# Patient Record
Sex: Male | Born: 1985 | Race: White | Hispanic: No | Marital: Single | Smoking: Never smoker
Health system: Southern US, Community
[De-identification: ages and names within clinical notes are randomized; demographics above are authoritative.]

## PROBLEM LIST (undated history)

## (undated) DIAGNOSIS — M4800 Spinal stenosis, site unspecified: Secondary | ICD-10-CM

## (undated) DIAGNOSIS — M1990 Unspecified osteoarthritis, unspecified site: Secondary | ICD-10-CM

## (undated) DIAGNOSIS — K760 Fatty (change of) liver, not elsewhere classified: Secondary | ICD-10-CM

## (undated) DIAGNOSIS — F32A Depression, unspecified: Secondary | ICD-10-CM

## (undated) DIAGNOSIS — U071 COVID-19: Secondary | ICD-10-CM

## (undated) DIAGNOSIS — I10 Essential (primary) hypertension: Secondary | ICD-10-CM

## (undated) DIAGNOSIS — R519 Headache, unspecified: Secondary | ICD-10-CM

## (undated) DIAGNOSIS — F419 Anxiety disorder, unspecified: Secondary | ICD-10-CM

## (undated) DIAGNOSIS — Z789 Other specified health status: Secondary | ICD-10-CM

## (undated) DIAGNOSIS — E119 Type 2 diabetes mellitus without complications: Secondary | ICD-10-CM

## (undated) DIAGNOSIS — Z8489 Family history of other specified conditions: Secondary | ICD-10-CM

## (undated) DIAGNOSIS — F909 Attention-deficit hyperactivity disorder, unspecified type: Secondary | ICD-10-CM

## (undated) HISTORY — DX: Other specified health status: Z78.9

## (undated) HISTORY — PX: NO PAST SURGERIES: SHX2092

## (undated) HISTORY — DX: Spinal stenosis, site unspecified: M48.00

---

## 2013-11-28 ENCOUNTER — Ambulatory Visit (INDEPENDENT_AMBULATORY_CARE_PROVIDER_SITE_OTHER): Payer: Self-pay | Admitting: Sports Medicine

## 2013-11-28 ENCOUNTER — Encounter: Payer: Self-pay | Admitting: Sports Medicine

## 2013-11-28 VITALS — BP 121/80 | HR 65 | Ht 72.0 in | Wt 263.0 lb

## 2013-11-28 DIAGNOSIS — S62613A Displaced fracture of proximal phalanx of left middle finger, initial encounter for closed fracture: Secondary | ICD-10-CM

## 2013-11-28 DIAGNOSIS — IMO0001 Reserved for inherently not codable concepts without codable children: Secondary | ICD-10-CM | POA: Insufficient documentation

## 2013-11-28 MED ORDER — TRAMADOL HCL 50 MG PO TABS: ORAL_TABLET | ORAL | Status: DC

## 2013-11-28 NOTE — Progress Notes (Signed)
  Subjective:    CC: Injury to finger  HPI:  3 days ago this very pleasant 28 year old male was bowling, and he threw the ball he felt a pop in his left third finger with immediate pain, swelling, and bruising. He still had good function however pain worsened over the next couple of days and he presents for further evaluation and definitive treatment. Pain is worse at the volar neck of the left third proximal phalanx. Moderate, persistent.  Past medical history, Surgical history, Family history not pertinant except as noted below, Social history, Allergies, and medications have been entered into the medical record, reviewed, and no changes needed.   Review of Systems: No headache, visual changes, nausea, vomiting, diarrhea, constipation, dizziness, abdominal pain, skin rash, fevers, chills, night sweats, swollen lymph nodes, weight loss, chest pain, body aches, joint swelling, muscle aches, shortness of breath, mood changes, visual or auditory hallucinations.  Objective:    General: Well Developed, well nourished, and in no acute distress.  Neuro: Alert and oriented x3, extra-ocular muscles intact, sensation grossly intact.  HEENT: Normocephalic, atraumatic, pupils equal round reactive to light, neck supple, no masses, no lymphadenopathy, thyroid nonpalpable.  Skin: Warm and dry, no rashes noted.  Cardiac: Regular rate and rhythm, no murmurs rubs or gallops.  Respiratory: Clear to auscultation bilaterally. Not using accessory muscles, speaking in full sentences.  Abdominal: Soft, nontender, nondistended, positive bowel sounds, no masses, no organomegaly.  Left hand: visible swelling on the entirety of the third digit, there is tenderness to palpation at the PIP joint, but excellent strength and range of motion, with good function of the extensors, lumbricals, flexor digitorum superficialis and flexor digitorum profundus. Collateral ligaments are stable. He is also neurovascularly intact  distally.  Procedure: Diagnostic Ultrasound of  left finger Device: GE Logiq E  Findings: There is a hypoechoic gap at the volar neck of the left third proximal phalanx, there is also reactive hypoechoic change in the flexor digitorum superficialis and profundus tendon sheaths. Images not stored. Impression: Non-angulated, nondisplaced fracture through the volar neck of the left third proximal phalanx.  Extension splint placed.  Impression and Recommendations:    The patient was counselled, risk factors were discussed, anticipatory guidance given.

## 2013-11-28 NOTE — Assessment & Plan Note (Signed)
Per patient request, no imaging of that ultrasound keep costs down. I did see the continuity of the flexor digitorum superficialis, profundus, and lumbricals, and a small fracture through the neck of the left third proximal phalanx. There is also some secondary edema in the flexor tendon sheath. This appears nondisplaced and non-angulated, and function of the finger is excellent. Static extension splint placed. Tramadol for pain. Return in 3 weeks.  I did not bill a fracture code.

## 2013-12-19 ENCOUNTER — Encounter: Payer: Self-pay | Admitting: Sports Medicine

## 2013-12-19 ENCOUNTER — Ambulatory Visit (INDEPENDENT_AMBULATORY_CARE_PROVIDER_SITE_OTHER): Payer: Self-pay | Admitting: Sports Medicine

## 2013-12-19 VITALS — BP 126/71 | HR 78 | Ht 72.0 in | Wt 265.0 lb

## 2013-12-19 DIAGNOSIS — S62613D Displaced fracture of proximal phalanx of left middle finger, subsequent encounter for fracture with routine healing: Secondary | ICD-10-CM

## 2013-12-19 NOTE — Assessment & Plan Note (Signed)
Discontinue splinting, start gentle range of motion exercises. May start gentle bowling with a lightweight ball in 2 weeks, 15 pound ball in 3 weeks. Return to see me in 4 weeks. There was reactive tendinitis in the flexor digitorum tendons on ultrasound, at the six-week point this would be a candidate for injection if continues to have pain referable to the tendon sheath.

## 2013-12-19 NOTE — Progress Notes (Signed)
  Subjective: 3 weeks post nondisplaced fracture of the volar neck of the left third finger. Only slight amount of pain. Going crazy being unable to bowl.   Objective: General: Well-developed, well-nourished, and in no acute distress. Left third finger: Minimal tenderness to palpation, good function, neurovascularly intact distally.  Assessment/plan:

## 2014-01-16 ENCOUNTER — Encounter: Payer: Self-pay | Admitting: Sports Medicine

## 2014-01-16 ENCOUNTER — Ambulatory Visit (INDEPENDENT_AMBULATORY_CARE_PROVIDER_SITE_OTHER): Payer: Self-pay | Admitting: Sports Medicine

## 2014-01-16 VITALS — BP 137/84 | HR 77 | Ht 72.0 in | Wt 258.0 lb

## 2014-01-16 DIAGNOSIS — S62613D Displaced fracture of proximal phalanx of left middle finger, subsequent encounter for fracture with routine healing: Secondary | ICD-10-CM

## 2014-01-16 DIAGNOSIS — M779 Enthesopathy, unspecified: Principal | ICD-10-CM

## 2014-01-16 DIAGNOSIS — M778 Other enthesopathies, not elsewhere classified: Secondary | ICD-10-CM | POA: Insufficient documentation

## 2014-01-16 DIAGNOSIS — M6588 Other synovitis and tenosynovitis, other site: Secondary | ICD-10-CM

## 2014-01-16 NOTE — Progress Notes (Signed)
  Subjective:    CC: Follow-up  HPI: Kenneth Mcbride returns, he is approximate 6 weeks post fracture of the left third proximal phalanx. The fracture site is pain-free however he continues to have pain along the flexor tendon sheath, we did diagnose a flexor tendinitis on ultrasound at the last visit. He is amenable to try interventional management. Pain is moderate, persistent. He is unable to fully flex the finger.  Past medical history, Surgical history, Family history not pertinant except as noted below, Social history, Allergies, and medications have been entered into the medical record, reviewed, and no changes needed.   Review of Systems: No fevers, chills, night sweats, weight loss, chest pain, or shortness of breath.   Objective:    General: Well Developed, well nourished, and in no acute distress.  Neuro: Alert and oriented x3, extra-ocular muscles intact, sensation grossly intact.  HEENT: Normocephalic, atraumatic, pupils equal round reactive to light, neck supple, no masses, no lymphadenopathy, thyroid nonpalpable.  Skin: Warm and dry, no rashes. Cardiac: Regular rate and rhythm, no murmurs rubs or gallops, no lower extremity edema.  Respiratory: Clear to auscultation bilaterally. Not using accessory muscles, speaking in full sentences.  Left hand: Tender to palpation along the flexor digitorum tendon sheath just distal to the A1 pulley, no palpable nodules or triggering. Fracture site is no longer tender to palpation. Lacks approximately 5 of flexion at the third MCP and PIP joints.  Procedure: Real-time Ultrasound Guided Injection of left third flexor digitorum tendon sheath Device: GE Logiq E  Verbal informed consent obtained.  Time-out conducted.  Noted no overlying erythema, induration, or other signs of local infection.  Skin prepped in a sterile fashion.  Local anesthesia: Topical Ethyl chloride.  With sterile technique and under real time ultrasound guidance:  1 mL kenalog  40, 2 mL lidocaine injected easily. Completed without difficulty  Pain immediately resolved suggesting accurate placement of the medication.  Advised to call if fevers/chills, erythema, induration, drainage, or persistent bleeding.  Images permanently stored and available for review in the ultrasound unit.  Impression: Technically successful ultrasound guided injection.  Impression and Recommendations:

## 2014-01-16 NOTE — Assessment & Plan Note (Signed)
Clinically resolved, return as needed. 

## 2014-01-16 NOTE — Assessment & Plan Note (Signed)
Fracture is healed. There is significant tendinitis of the left third flexor digitorum tendon sheath. This is preventing him from obtaining full flexion. Tendon sheath injection as above, no bowling for a day. Return to see me in one month.

## 2014-02-13 ENCOUNTER — Ambulatory Visit: Payer: Self-pay | Admitting: Sports Medicine

## 2019-05-20 ENCOUNTER — Other Ambulatory Visit: Payer: Self-pay

## 2019-05-20 ENCOUNTER — Ambulatory Visit (INDEPENDENT_AMBULATORY_CARE_PROVIDER_SITE_OTHER): Payer: Self-pay | Admitting: Sports Medicine

## 2019-05-20 ENCOUNTER — Encounter: Payer: Self-pay | Admitting: Sports Medicine

## 2019-05-20 DIAGNOSIS — M48061 Spinal stenosis, lumbar region without neurogenic claudication: Secondary | ICD-10-CM | POA: Insufficient documentation

## 2019-05-20 DIAGNOSIS — M5416 Radiculopathy, lumbar region: Secondary | ICD-10-CM

## 2019-05-20 MED ORDER — KETOROLAC TROMETHAMINE 60 MG/2ML IM SOLN
60.0000 mg | Freq: Once | INTRAMUSCULAR | Status: AC
  Administered 2019-05-20: 60 mg via INTRAMUSCULAR

## 2019-05-20 MED ORDER — PREDNISONE 50 MG PO TABS
ORAL_TABLET | ORAL | 0 refills | Status: DC
Start: 2019-05-20 — End: 2019-05-26

## 2019-05-20 MED ORDER — METHYLPREDNISOLONE SODIUM SUCC 125 MG IJ SOLR
125.0000 mg | Freq: Once | INTRAMUSCULAR | Status: AC
  Administered 2019-05-20: 125 mg via INTRAMUSCULAR

## 2019-05-20 MED ORDER — CYCLOBENZAPRINE HCL 10 MG PO TABS: ORAL_TABLET | ORAL | 0 refills | Status: DC

## 2019-05-20 NOTE — Progress Notes (Signed)
    Procedures performed today:    None.  Independent interpretation of notes and tests performed by another provider:   None.  Impression and Recommendations:    Left lumbar radiculitis Left S1 distribution to the back of the calf. No red flags. Present now for several months, he is in severe pain, Toradol 60, Solu-Medrol 125 intramuscular. Adding lumbar spine MRI, 5 days of prednisone, Flexeril at night, home rehab exercises printed, return to see me in 4 to 6 weeks.   We will proceed with an epidural if no better.    ___________________________________________ Kenneth Mcbride. Benjamin Stain, M.D., ABFM., CAQSM. Primary Care and Sports Medicine [facility]  Adjunct Instructor of Family Medicine  [employer]

## 2019-05-20 NOTE — Addendum Note (Signed)
Addended by: Juel Burrow on: 05/20/2019 04:30 PM   Modules accepted: Orders

## 2019-05-20 NOTE — Assessment & Plan Note (Signed)
Left S1 distribution to the back of the calf. No red flags. Present now for several months, he is in severe pain, Toradol 60, Solu-Medrol 125 intramuscular. Adding lumbar spine MRI, 5 days of prednisone, Flexeril at night, home rehab exercises printed, return to see me in 4 to 6 weeks.   We will proceed with an epidural if no better.

## 2019-05-22 NOTE — Telephone Encounter (Signed)
Forwarding to provider.

## 2019-05-26 MED ORDER — PREDNISONE 10 MG (48) PO TBPK: ORAL_TABLET | Freq: Every day | ORAL | 0 refills | Status: DC

## 2019-05-28 ENCOUNTER — Other Ambulatory Visit: Payer: Self-pay

## 2019-05-28 ENCOUNTER — Ambulatory Visit (INDEPENDENT_AMBULATORY_CARE_PROVIDER_SITE_OTHER): Payer: Self-pay | Admitting: Sports Medicine

## 2019-05-28 DIAGNOSIS — M5416 Radiculopathy, lumbar region: Secondary | ICD-10-CM

## 2019-05-28 MED ORDER — OXYCODONE-ACETAMINOPHEN 10-325 MG PO TABS: 1.0000 | ORAL_TABLET | Freq: Three times a day (TID) | ORAL | 0 refills | Status: DC | PRN

## 2019-05-28 MED ORDER — GABAPENTIN 300 MG PO CAPS: ORAL_CAPSULE | ORAL | 3 refills | Status: DC

## 2019-05-28 NOTE — Progress Notes (Signed)
    Procedures performed today:    None.  Independent interpretation of notes and tests performed by another provider:   None.  Brief History, Exam, Impression, and Recommendations:    Left lumbar radiculitis Canon returns, he is a pleasant 34 year old male with left S1 distribution radiculitis. He still has no red flags, he was scheduled for MRI this weekend but the truck broke down. He is now scheduled for this coming weekend but having significant pain in spite of prednisone, Flexeril. He is unable to tolerate the rehab exercises. Hydrocodone has not been effective. Increasing to 10 mg oxycodone/acetaminophen, and adding gabapentin to take at night as he does have significant discomfort from the radicular paresthesias. I'd like to see him back after his MRI and we will go over interventional planning.    ___________________________________________ Ihor Austin. Benjamin Stain, M.D., ABFM., CAQSM. Primary Care and Sports Medicine [facility]  Adjunct Instructor of Family Medicine  [employer]

## 2019-05-28 NOTE — Assessment & Plan Note (Signed)
Kenneth Mcbride returns, he is a pleasant 34 year old male with left S1 distribution radiculitis. He still has no red flags, he was scheduled for MRI this weekend but the truck broke down. He is now scheduled for this coming weekend but having significant pain in spite of prednisone, Flexeril. He is unable to tolerate the rehab exercises. Hydrocodone has not been effective. Increasing to 10 mg oxycodone/acetaminophen, and adding gabapentin to take at night as he does have significant discomfort from the radicular paresthesias. I'd like to see him back after his MRI and we will go over interventional planning.

## 2019-05-31 ENCOUNTER — Ambulatory Visit (INDEPENDENT_AMBULATORY_CARE_PROVIDER_SITE_OTHER): Payer: Self-pay

## 2019-05-31 ENCOUNTER — Other Ambulatory Visit: Payer: Self-pay

## 2019-05-31 DIAGNOSIS — M5416 Radiculopathy, lumbar region: Secondary | ICD-10-CM

## 2019-06-01 DIAGNOSIS — M5416 Radiculopathy, lumbar region: Secondary | ICD-10-CM

## 2019-06-02 NOTE — Telephone Encounter (Signed)
Orders placed for left L3-L4 interlaminar epidural, please call [place] [facility] to schedule.

## 2019-06-03 ENCOUNTER — Other Ambulatory Visit: Payer: Self-pay

## 2019-06-03 ENCOUNTER — Encounter: Payer: Self-pay | Admitting: Sports Medicine

## 2019-06-03 ENCOUNTER — Ambulatory Visit (INDEPENDENT_AMBULATORY_CARE_PROVIDER_SITE_OTHER): Payer: Self-pay | Admitting: Sports Medicine

## 2019-06-03 DIAGNOSIS — M48061 Spinal stenosis, lumbar region without neurogenic claudication: Secondary | ICD-10-CM

## 2019-06-03 MED ORDER — HYDROMORPHONE HCL 4 MG PO TABS: 4.0000 mg | ORAL_TABLET | Freq: Four times a day (QID) | ORAL | 0 refills | Status: DC | PRN

## 2019-06-03 NOTE — Progress Notes (Signed)
    Procedures performed today:    None.  Independent interpretation of notes and tests performed by another provider:   None.  Brief History, Exam, Impression, and Recommendations:    Lumbar spinal stenosis This is a pleasant 34 year old male with a long history of back pain, his MRI did unfortunately show multilevel severe lumbar spinal stenosis worst at the L3-L4 level. He has not responded to steroids, gabapentin at the low dose, he has not responded to 10 mg oxycodone/acetaminophen. He does have an epidural scheduled for Thursday, increasing from Percocet to Dilaudid 4 mg 3 times daily. He will continue to up taper his gabapentin. Looking at his lumbar spine I am concerned that he will need a multilevel decompressive procedure. Return to see me 1 month after the epidural.  Of note it sounds as though he has several medical problems including hypertension, diabetes, he does plan to establish care with one of my partners.    ___________________________________________ Ihor Austin. Benjamin Stain, M.D., ABFM., CAQSM. Primary Care and Sports Medicine [facility]  Adjunct Instructor of Family Medicine  [employer]

## 2019-06-03 NOTE — Assessment & Plan Note (Addendum)
This is a pleasant 34 year old male with a long history of back pain, his MRI did unfortunately show multilevel severe lumbar spinal stenosis worst at the L3-L4 level. He has not responded to steroids, gabapentin at the low dose, he has not responded to 10 mg oxycodone/acetaminophen. He does have an epidural scheduled for Thursday, increasing from Percocet to Dilaudid 4 mg 3 times daily. He will continue to up taper his gabapentin. Looking at his lumbar spine I am concerned that he will need a multilevel decompressive procedure. Return to see me 1 month after the epidural.  Of note it sounds as though he has several medical problems including hypertension, diabetes, he does plan to establish care with one of my partners.

## 2019-06-05 ENCOUNTER — Other Ambulatory Visit: Payer: Self-pay

## 2019-06-05 ENCOUNTER — Ambulatory Visit
Admission: RE | Admit: 2019-06-05 | Discharge: 2019-06-05 | Disposition: A | Discharge status: Home / Self Care | Payer: Self-pay | Source: Ambulatory Visit | Attending: Sports Medicine | Admitting: Sports Medicine

## 2019-06-05 DIAGNOSIS — M5416 Radiculopathy, lumbar region: Secondary | ICD-10-CM

## 2019-06-05 MED ORDER — IOPAMIDOL (ISOVUE-M 200) INJECTION 41%
1.0000 mL | Freq: Once | INTRAMUSCULAR | Status: AC
  Administered 2019-06-05: 1 mL via EPIDURAL

## 2019-06-05 MED ORDER — METHYLPREDNISOLONE ACETATE 40 MG/ML INJ SUSP (RADIOLOG
120.0000 mg | Freq: Once | INTRAMUSCULAR | Status: AC
  Administered 2019-06-05: 80 mg via EPIDURAL

## 2019-06-05 NOTE — Discharge Instructions (Signed)

## 2019-06-10 DIAGNOSIS — M48062 Spinal stenosis, lumbar region with neurogenic claudication: Secondary | ICD-10-CM

## 2019-06-10 DIAGNOSIS — M48061 Spinal stenosis, lumbar region without neurogenic claudication: Secondary | ICD-10-CM

## 2019-06-11 ENCOUNTER — Ambulatory Visit: Payer: Self-pay | Admitting: Family Medicine

## 2019-06-16 ENCOUNTER — Ambulatory Visit: Payer: Self-pay | Admitting: Sports Medicine

## 2019-06-17 MED ORDER — HYDROMORPHONE HCL 4 MG PO TABS: 4.0000 mg | ORAL_TABLET | Freq: Four times a day (QID) | ORAL | 0 refills | Status: AC | PRN

## 2019-06-17 NOTE — Addendum Note (Signed)
Addended by: Monica Becton on: 06/17/2019 10:51 AM   Modules accepted: Orders

## 2019-07-01 ENCOUNTER — Ambulatory Visit: Payer: Self-pay | Admitting: Sports Medicine

## 2019-07-01 DIAGNOSIS — M48062 Spinal stenosis, lumbar region with neurogenic claudication: Secondary | ICD-10-CM

## 2019-07-01 MED ORDER — GABAPENTIN 600 MG PO TABS: 600.0000 mg | ORAL_TABLET | Freq: Three times a day (TID) | ORAL | 3 refills | Status: DC

## 2019-07-11 ENCOUNTER — Other Ambulatory Visit: Payer: Self-pay | Admitting: Orthopedic Surgery

## 2019-07-20 NOTE — Progress Notes (Signed)
New Patient Office Visit  Subjective:  Patient ID: Kenneth Mcbride, male    DOB: 01/19/1986  Age: 34 y.o. MRN: [id number]  CC:  Chief Complaint  Patient presents with  . Establish Care  . Back Pain    scheduled for surgery on 08/07/2019 and needs to have labs done prior to surgery    HPI Kenneth Mcbride presents to discuss lab work prior to having back surgery on 08/07/2019. He plans to establish care with Iran Planas, PA as his girlfriend is a patient of hers.  Has been dealing with low back pain and spinal trouble for at least 13 weeks. Is planning to have surgery for decompression. Experiencing weakness, paresthesias, coolness from the knees down bilaterally. Has also noted "hot spots" on the left side of the pelvis where the skin feels very hot, occurring at night and interrupting sleep. No alteration in skin integrity.   Has been occasionally checking blood sugars and notes that they have been running in the 100s until the last week or so where they have been in the 200s. Strong family history of diabetes. Notes an increase in urination when sugars are running high.   Past Medical History:  Diagnosis Date  . No pertinent past medical history   . Spinal stenosis     Past Surgical History:  Procedure Laterality Date  . NO PAST SURGERIES      Family History  Problem Relation Age of Onset  . Diabetes Father   . Heart disease Father   . Diabetes Paternal Grandfather   . COPD Paternal Grandfather     Social History   Socioeconomic History  . Marital status: Single    Spouse name: Not on file  . Number of children: Not on file  . Years of education: Not on file  . Highest education level: Not on file  Occupational History  . Not on file  Tobacco Use  . Smoking status: Never Smoker  . Smokeless tobacco: Never Used  Substance and Sexual Activity  . Alcohol use: Not Currently  . Drug use: Never  . Sexual activity: Not Currently    Partners: Female  Other Topics  Concern  . Not on file  Social History Narrative  . Not on file   Social Determinants of Health   Financial Resource Strain:   . Difficulty of Paying Living Expenses:   Food Insecurity:   . Worried About Charity fundraiser in the Last Year:   . Arboriculturist in the Last Year:   Transportation Needs:   . Film/video editor (Medical):   [name] Lack of Transportation (Non-Medical):   Physical Activity:   . Days of Exercise per Week:   . Minutes of Exercise per Session:   Stress:   . Feeling of Stress :   Social Connections:   . Frequency of Communication with Friends and Family:   . Frequency of Social Gatherings with Friends and Family:   . Attends Religious Services:   . Active Member of Clubs or Organizations:   . Attends Archivist Meetings:   [name] Marital Status:   Intimate Partner Violence:   . Fear of Current or Ex-Partner:   . Emotionally Abused:   [name] Physically Abused:   . Sexually Abused:     ROS Review of Systems  Constitutional: Positive for activity change. Negative for chills, fatigue, fever and unexpected weight change.  Respiratory: Negative for cough, shortness of breath and wheezing.   Cardiovascular: Negative  for chest pain, palpitations and leg swelling.  Genitourinary: Positive for frequency. Negative for dysuria.  Musculoskeletal: Positive for back pain, gait problem and myalgias.  Skin:       Temperature difference to bilateral lower legs   Psychiatric/Behavioral: Positive for sleep disturbance.    Objective:   Today's Vitals: BP (!) 137/91   Pulse (!) 103   Temp 98.2 F (36.8 C) (Oral)   Ht 6' (1.829 m)   Wt 284 lb 0.6 oz (128.8 kg)   SpO2 99%   BMI 38.52 kg/m   Physical Exam Vitals reviewed.  Constitutional:      General: He is not in acute distress.    Appearance: Normal appearance. He is obese.  HENT:     Head: Normocephalic and atraumatic.  Cardiovascular:     Rate and Rhythm: Normal rate and regular rhythm.      Pulses: Normal pulses.     Heart sounds: Normal heart sounds. No murmur. No friction rub. No gallop.   Pulmonary:     Effort: Pulmonary effort is normal. No respiratory distress.     Breath sounds: Normal breath sounds. No wheezing, rhonchi or rales.  Skin:    General: Skin is warm and dry.  Neurological:     Mental Status: He is alert and oriented to person, place, and time.     Motor: Weakness (BLE) present.     Gait: Gait abnormal (wheelchair).  Psychiatric:        Mood and Affect: Mood normal.        Behavior: Behavior normal.        Thought Content: Thought content normal.        Judgment: Judgment normal.     Assessment & Plan:   1. Elevated blood sugar/urinary frequency Checking CBC, CMP, and HgbA1c today to establish baseline and check for diabetes. - Hemoglobin A1c - CBC - COMPLETE METABOLIC PANEL WITH GFR  2. Chronic bilateral low back pain, unspecified whether sciatica present Planning for lumbar spine surgery on 08/07/2019. Presurgical workup planned for next week.   Outpatient Encounter Medications as of 07/21/2019  Medication Sig  . acetaminophen (TYLENOL) 500 MG tablet Take 1,000 mg by mouth every 6 (six) hours as needed for moderate pain.  . cyclobenzaprine (FLEXERIL) 10 MG tablet One half to one tab PO qHS, then increase gradually to one tab TID. (Patient taking differently: Take 10 mg by mouth 3 (three) times daily as needed for muscle spasms. )  . docusate sodium (COLACE) 100 MG capsule Take 100 mg by mouth 2 (two) times daily.  [name] gabapentin (NEURONTIN) 600 MG tablet Take 1 tablet (600 mg total) by mouth 3 (three) times daily.  [name] GLUCOSAMINE-CHONDROITIN PO Take 1 tablet by mouth daily.  [name] ibuprofen (ADVIL) 200 MG tablet Take 200 mg by mouth every 6 (six) hours as needed.  . Magnesium 250 MG TABS Take 250 mg by mouth daily.  . Multiple Vitamin (MULTIVITAMIN WITH MINERALS) TABS tablet Take 1 tablet by mouth daily.  . Potassium 99 MG TABS Take 99 mg by mouth in  the morning and at bedtime.   No facility-administered encounter medications on file as of 07/21/2019.    Follow-up: Return for appointment with Lesly Rubenstein to establish care.   Thayer Ohm, DNP, APRN, FNP-BC  [facility]

## 2019-07-21 ENCOUNTER — Other Ambulatory Visit: Payer: Self-pay

## 2019-07-21 ENCOUNTER — Encounter: Payer: Self-pay | Admitting: Medical-Surgical

## 2019-07-21 ENCOUNTER — Ambulatory Visit (INDEPENDENT_AMBULATORY_CARE_PROVIDER_SITE_OTHER): Payer: 59 | Admitting: Medical-Surgical

## 2019-07-21 VITALS — BP 137/91 | HR 103 | Temp 98.2°F | Ht 72.0 in | Wt 284.0 lb

## 2019-07-21 DIAGNOSIS — G8929 Other chronic pain: Secondary | ICD-10-CM | POA: Diagnosis not present

## 2019-07-21 DIAGNOSIS — R739 Hyperglycemia, unspecified: Secondary | ICD-10-CM | POA: Diagnosis not present

## 2019-07-21 DIAGNOSIS — R350 Frequency of micturition: Secondary | ICD-10-CM

## 2019-07-21 DIAGNOSIS — M545 Low back pain: Secondary | ICD-10-CM | POA: Diagnosis not present

## 2019-07-22 LAB — COMPLETE METABOLIC PANEL WITH GFR
AG Ratio: 2.1 (calc) (ref 1.0–2.5)
ALT: 35 U/L (ref 9–46)
AST: 23 U/L (ref 10–40)
Albumin: 4.9 g/dL (ref 3.6–5.1)
Alkaline phosphatase (APISO): 93 U/L (ref 36–130)
BUN: 13 mg/dL (ref 7–25)
CO2: 28 mmol/L (ref 20–32)
Calcium: 9.9 mg/dL (ref 8.6–10.3)
Chloride: 102 mmol/L (ref 98–110)
Creat: 0.96 mg/dL (ref 0.60–1.35)
GFR, Est African American: 118 mL/min/{1.73_m2} (ref >=60)
GFR, Est Non African American: 102 mL/min/{1.73_m2} (ref >=60)
Globulin: 2.3 g/dL (calc) (ref 1.9–3.7)
Glucose, Bld: 105 mg/dL — ABNORMAL HIGH (ref 65–99)
Potassium: 3.6 mmol/L (ref 3.5–5.3)
Sodium: 139 mmol/L (ref 135–146)
Total Bilirubin: 0.6 mg/dL (ref 0.2–1.2)
Total Protein: 7.2 g/dL (ref 6.1–8.1)

## 2019-07-22 LAB — HEMOGLOBIN A1C
Hgb A1c MFr Bld: 7.3 % of total Hgb — ABNORMAL HIGH (ref <5.7)
Mean Plasma Glucose: 163 (calc)
eAG (mmol/L): 9 (calc)

## 2019-07-22 LAB — CBC
HCT: 46.9 % (ref 38.5–50.0)
Hemoglobin: 15.8 g/dL (ref 13.2–17.1)
MCH: 28.1 pg (ref 27.0–33.0)
MCHC: 33.7 g/dL (ref 32.0–36.0)
MCV: 83.3 fL (ref 80.0–100.0)
MPV: 10.4 fL (ref 7.5–12.5)
Platelets: 255 10*3/uL (ref 140–400)
RBC: 5.63 10*6/uL (ref 4.20–5.80)
RDW: 13.2 % (ref 11.0–15.0)
WBC: 10.3 10*3/uL (ref 3.8–10.8)

## 2019-07-22 MED ORDER — METFORMIN HCL 500 MG PO TABS: 500.0000 mg | ORAL_TABLET | Freq: Two times a day (BID) | ORAL | 2 refills | Status: DC

## 2019-07-22 NOTE — Addendum Note (Signed)
Addended byChristen Butter on: 07/22/2019 07:47 AM   Modules accepted: Orders

## 2019-07-29 NOTE — Progress Notes (Signed)
[id number] [place], [place] - [address] [place] [address] Phone: [phone] Fax: [phone]  [id number] - [place], [place] - [address] AT [facility] [address] [place] [address] Phone: [phone] Fax: [phone]      Your procedure is scheduled on August 07, 2019.  Report to [facility] Main Entrance "A" at 9:15 A.M., and check in at the Admitting office.  Call this number if you have problems the morning of surgery:  [phone]  Call [phone] if you have any questions prior to your surgery date Monday-Friday 8am-4pm    Remember:  Do not eat  after midnight the night before your surgery  You may drink clear liquids until 9:15 the morning of your surgery.   Clear liquids allowed are: Water, Non-Citrus Juices (without pulp), Carbonated Beverages, Clear Tea, Black Coffee Only, and Gatorade  Please complete your Gatorade G2  that was provided to you by 9:15 the morning of surgery.  Please, if able, drink it in one setting. DO NOT SIP.    Take these medicines the morning of surgery with A SIP OF WATER: gabapentin (NEURONTIN) cyclobenzaprine (FLEXERIL) - as needed  As of today, STOP taking any Aspirin (unless otherwise instructed by your surgeon) and Aspirin containing products, Aleve, Naproxen, Ibuprofen, Motrin, Advil, Goody's, BC's, all herbal medications, fish oil, and all vitamins.   WHAT DO I DO ABOUT MY DIABETES MEDICATION?   [name] Do not take oral diabetes medicines (pills) the morning of surgery.  HOW TO MANAGE YOUR DIABETES BEFORE AND AFTER SURGERY  Why is it important to control my blood sugar before and after surgery? . Improving blood sugar levels before and after surgery helps healing and can limit problems. . A way of improving blood sugar control is eating a healthy diet by: o  Eating less sugar and carbohydrates o  Increasing activity/exercise o  Talking with your doctor  about reaching your blood sugar goals . High blood sugars (greater than 180 mg/dL) can raise your risk of infections and slow your recovery, so you will need to focus on controlling your diabetes during the weeks before surgery. . Make sure that the doctor who takes care of your diabetes knows about your planned surgery including the date and location.  How do I manage my blood sugar before surgery? . Check your blood sugar at least 4 times a day, starting 2 days before surgery, to make sure that the level is not too high or low. . Check your blood sugar the morning of your surgery when you wake up and every 2 hours until you get to the Short Stay unit. o If your blood sugar is less than 70 mg/dL, you will need to treat for low blood sugar: - Do not take insulin. - Treat a low blood sugar (less than 70 mg/dL) with  cup of clear juice (cranberry or apple), 4 glucose tablets, OR glucose gel. - Recheck blood sugar in 15 minutes after treatment (to make sure it is greater than 70 mg/dL). If your blood sugar is not greater than 70 mg/dL on recheck, call [phone] for further instructions. . Report your blood sugar to the short stay nurse when you get to Short Stay.  . If you are admitted to the hospital after surgery: o Your blood sugar will be checked by the staff and you will probably be given insulin after surgery (instead of oral  diabetes medicines) to make sure you have good blood sugar levels. o The goal for blood sugar control after surgery is 80-180 mg/dL.                           Do not wear jewelry            Do not wear lotions, powders, colognes, or deodorant.            Do not shave 48 hours prior to surgery.  Men may shave face and neck.            Do not bring valuables to the hospital.            [facility] is not responsible for any belongings or valuables.  Do NOT Smoke (Tobacco/Vapping) or drink Alcohol 24 hours prior to your procedure If you use a CPAP at night, you  may bring all equipment for your overnight stay.   Contacts, glasses, dentures or bridgework may not be worn into surgery.      For patients admitted to the hospital, discharge time will be determined by your treatment team.   Patients discharged the day of surgery will not be allowed to drive home, and someone needs to stay with them for 24 hours.    Special instructions:   [place]- Preparing For Surgery  Before surgery, you can play an important role. Because skin is not sterile, your skin needs to be as free of germs as possible. You can reduce the number of germs on your skin by washing with CHG (chlorahexidine gluconate) Soap before surgery.  CHG is an antiseptic cleaner which kills germs and bonds with the skin to continue killing germs even after washing.    Oral Hygiene is also important to reduce your risk of infection.  Remember - BRUSH YOUR TEETH THE MORNING OF SURGERY WITH YOUR REGULAR TOOTHPASTE  Please do not use if you have an allergy to CHG or antibacterial soaps. If your skin becomes reddened/irritated stop using the CHG.  Do not shave (including legs and underarms) for at least 48 hours prior to first CHG shower. It is OK to shave your face.  Please follow these instructions carefully.   1. Shower the NIGHT BEFORE SURGERY and the MORNING OF SURGERY with CHG Soap.   2. If you chose to wash your hair, wash your hair first as usual with your normal shampoo.  3. After you shampoo, rinse your hair and body thoroughly to remove the shampoo.  4. Use CHG as you would any other liquid soap. You can apply CHG directly to the skin and wash gently with a scrungie or a clean washcloth.   5. Apply the CHG Soap to your body ONLY FROM THE NECK DOWN.  Do not use on open wounds or open sores. Avoid contact with your eyes, ears, mouth and genitals (private parts). Wash Face and genitals (private parts)  with your normal soap.   6. Wash thoroughly, paying special attention to the  area where your surgery will be performed.  7. Thoroughly rinse your body with warm water from the neck down.  8. DO NOT shower/wash with your normal soap after using and rinsing off the CHG Soap.  9. Pat yourself dry with a CLEAN TOWEL.  10. Wear CLEAN PAJAMAS to bed the night before surgery, wear comfortable clothes the morning of surgery  11. Place CLEAN SHEETS on your bed the night of your  first shower and DO NOT SLEEP WITH PETS.   Day of Surgery:   Do not apply any deodorants/lotions.  Please wear clean clothes to the hospital/surgery center.   Remember to brush your teeth WITH YOUR REGULAR TOOTHPASTE.   Please read over the following fact sheets that you were given.

## 2019-07-30 ENCOUNTER — Encounter (HOSPITAL_COMMUNITY): Payer: Self-pay

## 2019-07-30 ENCOUNTER — Encounter (HOSPITAL_COMMUNITY)
Admission: RE | Admit: 2019-07-30 | Discharge: 2019-07-30 | Disposition: A | Discharge status: Home / Self Care | Payer: 59 | Source: Ambulatory Visit | Attending: Orthopedic Surgery | Admitting: Orthopedic Surgery

## 2019-07-30 ENCOUNTER — Other Ambulatory Visit: Payer: Self-pay

## 2019-07-30 ENCOUNTER — Ambulatory Visit (HOSPITAL_COMMUNITY): Payer: 59 | Admitting: Anesthesiology

## 2019-07-30 ENCOUNTER — Ambulatory Visit (HOSPITAL_COMMUNITY): Payer: 59 | Admitting: Physician Assistant

## 2019-07-30 DIAGNOSIS — Z01818 Encounter for other preprocedural examination: Secondary | ICD-10-CM | POA: Insufficient documentation

## 2019-07-30 HISTORY — DX: Type 2 diabetes mellitus without complications: E11.9

## 2019-07-30 HISTORY — DX: Family history of other specified conditions: Z84.89

## 2019-07-30 LAB — CBC WITH DIFFERENTIAL/PLATELET
Abs Immature Granulocytes: 0.03 10*3/uL (ref 0.00–0.07)
Basophils Absolute: 0.1 10*3/uL (ref 0.0–0.1)
Basophils Relative: 1 %
Eosinophils Absolute: 0.1 10*3/uL (ref 0.0–0.5)
Eosinophils Relative: 1 %
HCT: 47.1 % (ref 39.0–52.0)
Hemoglobin: 15.9 g/dL (ref 13.0–17.0)
Immature Granulocytes: 0 %
Lymphocytes Relative: 35 %
Lymphs Abs: 2.8 10*3/uL (ref 0.7–4.0)
MCH: 28.5 pg (ref 26.0–34.0)
MCHC: 33.8 g/dL (ref 30.0–36.0)
MCV: 84.4 fL (ref 80.0–100.0)
Monocytes Absolute: 0.6 10*3/uL (ref 0.1–1.0)
Monocytes Relative: 7 %
Neutro Abs: 4.5 10*3/uL (ref 1.7–7.7)
Neutrophils Relative %: 56 %
Platelets: 255 10*3/uL (ref 150–400)
RBC: 5.58 MIL/uL (ref 4.22–5.81)
RDW: 12.9 % (ref 11.5–15.5)
WBC: 8 10*3/uL (ref 4.0–10.5)
nRBC: 0 % (ref 0.0–0.2)

## 2019-07-30 LAB — TYPE AND SCREEN
ABO/RH(D): O NEG
Antibody Screen: NEGATIVE

## 2019-07-30 LAB — URINALYSIS, ROUTINE W REFLEX MICROSCOPIC
Bilirubin Urine: NEGATIVE
Glucose, UA: NEGATIVE mg/dL
Hgb urine dipstick: NEGATIVE
Ketones, ur: NEGATIVE mg/dL
Leukocytes,Ua: NEGATIVE
Nitrite: NEGATIVE
Protein, ur: NEGATIVE mg/dL
Specific Gravity, Urine: 1.012 (ref 1.005–1.030)
pH: 7 (ref 5.0–8.0)

## 2019-07-30 LAB — COMPREHENSIVE METABOLIC PANEL
ALT: 39 U/L (ref 0–44)
AST: 44 U/L — ABNORMAL HIGH (ref 15–41)
Albumin: 4.1 g/dL (ref 3.5–5.0)
Alkaline Phosphatase: 84 U/L (ref 38–126)
Anion gap: 8 (ref 5–15)
BUN: 12 mg/dL (ref 6–20)
CO2: 29 mmol/L (ref 22–32)
Calcium: 9.7 mg/dL (ref 8.9–10.3)
Chloride: 101 mmol/L (ref 98–111)
Creatinine, Ser: 0.89 mg/dL (ref 0.61–1.24)
GFR calc Af Amer: >60 mL/min (ref >60)
GFR calc non Af Amer: >60 mL/min (ref >60)
Glucose, Bld: 180 mg/dL — ABNORMAL HIGH (ref 70–99)
Potassium: 4.2 mmol/L (ref 3.5–5.1)
Sodium: 138 mmol/L (ref 135–145)
Total Bilirubin: 1.4 mg/dL — ABNORMAL HIGH (ref 0.3–1.2)
Total Protein: 7 g/dL (ref 6.5–8.1)

## 2019-07-30 LAB — SURGICAL PCR SCREEN
MRSA, PCR: NEGATIVE
Staphylococcus aureus: NEGATIVE

## 2019-07-30 LAB — GLUCOSE, CAPILLARY: Glucose-Capillary: 211 mg/dL — ABNORMAL HIGH (ref 70–99)

## 2019-07-30 LAB — APTT: aPTT: 27 seconds (ref 24–36)

## 2019-07-30 LAB — PROTIME-INR
INR: 1.1 (ref 0.8–1.2)
Prothrombin Time: 13.9 seconds (ref 11.4–15.2)

## 2019-07-30 LAB — ABO/RH: ABO/RH(D): O NEG

## 2019-07-30 NOTE — Progress Notes (Signed)
PCP - Abran Richard with [facility] Cardiologist - na  Chest x-ray - na EKG - 07/30/19 Stress Test - na ECHO - na Cardiac Cath - na  Sleep Study - na    Fasting Blood Sugar - 100-125 Checks Blood Sugar ___3__ times a day  Blood Thinner Instructions:na Aspirin Instructions:na  ERAS Protcol -yes PRE-SURGERY G2-  given  COVID TEST- 08/05/19   Anesthesia review: Pt. Stating he was sedentary and not watching diet. HGB A1C 7.3. Pt. Put on metformin,pt. Not taking wanting to control blood sugars by increasing his walking and diet. Family history of problems with anesthesia, Pt.'s Father and grand father,stated both heart stopped during surgery.Pt. not aware of reason why.  Patient denies shortness of breath, fever, cough and chest pain at PAT appointment   All instructions explained to the patient, with a verbal understanding of the material. Patient agrees to go over the instructions while at home for a better understanding. Patient also instructed to self quarantine after being tested for COVID-19. The opportunity to ask questions was provided.

## 2019-07-31 NOTE — Anesthesia Preprocedure Evaluation (Deleted)
Anesthesia Evaluation  Patient identified by MRN, date of birth, ID band Patient awake    Reviewed: Allergy & Precautions, NPO status , [name] Chart, lab work & pertinent test results  History of Anesthesia Complications Negative for: history of anesthetic complications  Airway Mallampati: II  TM Distance: >3 FB Neck ROM: Full    Dental  (+) Dental Advisory Given, Chipped,    Pulmonary neg pulmonary ROS,    Pulmonary exam normal        Cardiovascular hypertension (multiple elevated readings with no official diagnosis from PCP), Normal cardiovascular exam     Neuro/Psych negative neurological ROS  negative psych ROS   GI/Hepatic negative GI ROS, Neg liver ROS,   Endo/Other  diabetes, Poorly Controlled, Type 2 Obesity   Renal/GU negative Renal ROS     Musculoskeletal  Lumbar spinal stenosis    Abdominal (+) + obese,   Peds  Hematology negative hematology ROS (+)   Anesthesia Other Findings Covid neg 6/8  Reproductive/Obstetrics                           Anesthesia Physical Anesthesia Plan  ASA: III  Anesthesia Plan: General   Post-op Pain Management:    Induction: Intravenous  PONV Risk Score and Plan: 3 and Treatment may vary due to age or medical condition, Ondansetron, Midazolam and Scopolamine patch - Pre-op  Airway Management Planned: Oral ETT  Additional Equipment: None  Intra-op Plan:   Post-operative Plan: Extubation in OR  Informed Consent: I have reviewed the patients History and Physical, chart, labs and discussed the procedure including the risks, benefits and alternatives for the proposed anesthesia with the patient or authorized representative who has indicated his/her understanding and acceptance.     Dental advisory given  Plan Discussed with: CRNA, Anesthesiologist and Surgeon  Anesthesia Plan Comments:       Anesthesia Quick Evaluation

## 2019-07-31 NOTE — Progress Notes (Signed)
Anesthesia Chart Review:  34 yo male for L2-5 decompression with Dr. Merlyn Albert 08/07/19.  Hx of recent diagnosis of DMII. Pt prescribed metformin by PCP but elects not to take, wants to try lifestyle modifications first. A1c 7.3 on 07/21/19. CBG 211 at PAT. Counseled pt regarding importance of BG control and advised that markedly uncontrolled BG on DOS could be cause for cancellation. He was instructed to check BG regularly and followup with PCP if continues to be uncontrolled.   BP also elevated at PAT, 149/96. Review of history shows similar readings recently. Pt denies history of HTN. Feels elevated pressure related to back pain. He was advised to monitor pressure at home and followup with PCP if it remains elevated. He said he will buy a home BP cuff and monitor.   BP Readings from Last 3 Encounters:  07/30/19 (!) 149/96  07/21/19 (!) 137/91  06/05/19 (!) 155/84    At PAT pt reported a somewhat vague, remote history of periop complications in his father and paternal grandfather. He says they both "flatlined" during surgery. He cannot provide additional details. These surgeries were 30+ years ago. He says he just wanted to make [place] aware. He does report that both his father and grandfather subsequently underwent CABG procedures. He does not know of anyone else in his immediate family who has had anesthesia complications. He is not aware of any familial history of malignant hyperthermia. He has never personally had surgery. He has no personal history of CAD. Denies CP or SOB. He is limited by back pain. Denies any exertional CV symptoms. EKG today shows NSR.   Reviewed history with Dr. Krista Blue. No additional eval needed, okay to proceed as planned.   Last seen by PCP 07/21/19, discussed upcoming surgery.  Preop labs reviewed. BG elevated. Otherwise unremarkable.   EKG 07/30/19: NSR. Rate 94.   Zannie Cove [facility] Short Stay Center/Anesthesiology Phone [phone] 07/31/2019 2:49 PM

## 2019-08-05 ENCOUNTER — Other Ambulatory Visit (HOSPITAL_COMMUNITY)
Admission: RE | Admit: 2019-08-05 | Discharge: 2019-08-05 | Disposition: A | Discharge status: Home / Self Care | Payer: 59 | Source: Ambulatory Visit | Attending: Orthopedic Surgery | Admitting: Orthopedic Surgery

## 2019-08-05 DIAGNOSIS — Z01812 Encounter for preprocedural laboratory examination: Secondary | ICD-10-CM | POA: Diagnosis present

## 2019-08-05 DIAGNOSIS — Z20822 Contact with and (suspected) exposure to covid-19: Secondary | ICD-10-CM | POA: Insufficient documentation

## 2019-08-05 LAB — SARS CORONAVIRUS 2 (TAT 6-24 HRS): SARS Coronavirus 2: NEGATIVE

## 2019-08-07 ENCOUNTER — Other Ambulatory Visit: Payer: Self-pay

## 2019-08-07 ENCOUNTER — Encounter (HOSPITAL_COMMUNITY)
Admission: RE | Disposition: A | Discharge status: Home / Self Care | Payer: Self-pay | Source: Home / Self Care | Attending: Orthopedic Surgery

## 2019-08-07 ENCOUNTER — Telehealth: Payer: Self-pay | Admitting: Medical-Surgical

## 2019-08-07 ENCOUNTER — Encounter (HOSPITAL_COMMUNITY): Payer: Self-pay | Admitting: Orthopedic Surgery

## 2019-08-07 ENCOUNTER — Ambulatory Visit (HOSPITAL_COMMUNITY)
Admission: RE | Admit: 2019-08-07 | Discharge: 2019-08-07 | Disposition: A | Discharge status: Home / Self Care | Payer: 59 | Attending: Orthopedic Surgery | Admitting: Orthopedic Surgery

## 2019-08-07 DIAGNOSIS — Z6838 Body mass index (BMI) 38.0-38.9, adult: Secondary | ICD-10-CM | POA: Diagnosis not present

## 2019-08-07 DIAGNOSIS — M4800 Spinal stenosis, site unspecified: Secondary | ICD-10-CM | POA: Diagnosis not present

## 2019-08-07 DIAGNOSIS — E669 Obesity, unspecified: Secondary | ICD-10-CM | POA: Diagnosis not present

## 2019-08-07 DIAGNOSIS — M545 Low back pain: Secondary | ICD-10-CM | POA: Diagnosis present

## 2019-08-07 DIAGNOSIS — Z833 Family history of diabetes mellitus: Secondary | ICD-10-CM | POA: Insufficient documentation

## 2019-08-07 DIAGNOSIS — E119 Type 2 diabetes mellitus without complications: Secondary | ICD-10-CM | POA: Insufficient documentation

## 2019-08-07 DIAGNOSIS — Z825 Family history of asthma and other chronic lower respiratory diseases: Secondary | ICD-10-CM | POA: Insufficient documentation

## 2019-08-07 DIAGNOSIS — Z8249 Family history of ischemic heart disease and other diseases of the circulatory system: Secondary | ICD-10-CM | POA: Diagnosis not present

## 2019-08-07 DIAGNOSIS — Z5309 Procedure and treatment not carried out because of other contraindication: Secondary | ICD-10-CM | POA: Insufficient documentation

## 2019-08-07 LAB — GLUCOSE, CAPILLARY
Glucose-Capillary: 134 mg/dL — ABNORMAL HIGH (ref 70–99)
Glucose-Capillary: 152 mg/dL — ABNORMAL HIGH (ref 70–99)

## 2019-08-07 SURGERY — LUMBAR LAMINECTOMY/DECOMPRESSION MICRODISCECTOMY
Anesthesia: General

## 2019-08-07 MED ORDER — MIDAZOLAM HCL 2 MG/2ML IJ SOLN
INTRAMUSCULAR | Status: AC
  Filled 2019-08-07: qty 2

## 2019-08-07 MED ORDER — CHLORHEXIDINE GLUCONATE 0.12 % MT SOLN
15.0000 mL | Freq: Once | OROMUCOSAL | Status: AC
  Administered 2019-08-07: 15 mL via OROMUCOSAL
  Filled 2019-08-07: qty 15

## 2019-08-07 MED ORDER — POVIDONE-IODINE 7.5 % EX SOLN
Freq: Once | CUTANEOUS | Status: DC
  Filled 2019-08-07: qty 118

## 2019-08-07 MED ORDER — CEFAZOLIN SODIUM-DEXTROSE 2-4 GM/100ML-% IV SOLN
2.0000 g | INTRAVENOUS | Status: DC
  Filled 2019-08-07: qty 100

## 2019-08-07 MED ORDER — PROPOFOL 10 MG/ML IV BOLUS
INTRAVENOUS | Status: AC
  Filled 2019-08-07: qty 20

## 2019-08-07 MED ORDER — FENTANYL CITRATE (PF) 250 MCG/5ML IJ SOLN
INTRAMUSCULAR | Status: AC
  Filled 2019-08-07: qty 5

## 2019-08-07 MED ORDER — LACTATED RINGERS IV SOLN: INTRAVENOUS | Status: DC

## 2019-08-07 MED ORDER — ORAL CARE MOUTH RINSE: 15.0000 mL | Freq: Once | OROMUCOSAL | Status: AC

## 2019-08-07 NOTE — Telephone Encounter (Signed)
PT called pharmacy to get refill on metformin. They denied having that prescription. I made sure to verify the pharmacy location.   The PT was also denied surgery due to "untreated diabetes". I scheduled him an appointment on 6/11 at 2pm. Wife requested for a triage nurse to call her back today.  Please advise.

## 2019-08-07 NOTE — Progress Notes (Signed)
Per Dr. Yevette Edwards surgery cancelled. Surgeon spoke with patient about talking with PCP to better manage DM and then having surgery at a later time. Patient verbalized understanding.

## 2019-08-07 NOTE — Progress Notes (Signed)
Patient presented today for an elective L2-L5 decompression. It has come to my attention for the first time today, that the patient does have uncontrolled diabetes. The patient made no mention of this upon review of his intake paperwork from my office. However, the patient's hemoglobin A1c from 2-1/2 weeks ago was 7.3, diagnostic for uncontrolled diabetes. In addition, his most recent fasting blood glucose today is 152, also consistent with uncontrolled diabetes. Again, I am just learning about this diagnosis today for the first time. I did spend 20 to 30 minutes at the bedside with the patient today discussing with him and his girlfriend the fact that uncontrolled diabetes does increase the patient's risk for a postoperative wound infection and wound healing complications. In addition, the patient does have a body mass index of 38, consistent with obesity, which does also increase the patient's risk of wound healing complications and postoperative infection. I did discuss at length with the patient and his girlfriend that I do not feel that it would be appropriate to proceed with an elective surgery when he has a condition that is very treatable. Even with perfect management of his diabetes, he would still have risk of infection, so I do not feel it would be appropriate to except an increased risk of infection, when he does have a condition that is very treatable. It is also notable that he does tell me that his nurse practitioner, Christen Butter, has recently recommended Metformin, however, unfortunately, the patient elected to not start the medication that was recommended to him. Ultimately, I do feel that he does need to follow his previously-made recommendations, and that he does need to get better control of his blood sugar prior to proceeding with surgery. I do feel this would be the most responsible and appropriate course of action, as opposed to proceeding with an elective surgery in the setting of uncontrolled  diabetes and obesity. I did attempt to reach out to his nurse practitioner, Christen Butter, but this was unsuccessful. I will continue to reach out to her to discuss the patient's situation with her.  Estill Bamberg, MD

## 2019-08-07 NOTE — Telephone Encounter (Signed)
Sohrab advised to start treatment as directed during the visit. Cancelled appointment.

## 2019-08-08 ENCOUNTER — Telehealth: Payer: Self-pay | Admitting: Medical-Surgical

## 2019-08-08 ENCOUNTER — Ambulatory Visit: Payer: 59 | Admitting: Medical-Surgical

## 2019-08-08 NOTE — Telephone Encounter (Signed)
That's ok with me as well.

## 2019-08-08 NOTE — Telephone Encounter (Signed)
Ok to double book for next week.

## 2019-08-08 NOTE — Telephone Encounter (Signed)
Ok for me

## 2019-08-08 NOTE — Telephone Encounter (Signed)
Significant other, Okey Regal, would like to know if patient can transfer care to [facility], [place]. Soon to be wife Okey Regal, is her patient. Please advise if this would be ok.

## 2019-08-08 NOTE — Telephone Encounter (Signed)
Appointment has been made. No further questions at this time.  

## 2019-08-08 NOTE — Telephone Encounter (Signed)
Patient would like to know if he can be worked in sooner. Nothing open next week.

## 2019-08-12 ENCOUNTER — Other Ambulatory Visit: Payer: Self-pay

## 2019-08-12 ENCOUNTER — Encounter: Payer: Self-pay | Admitting: Physician Assistant

## 2019-08-12 ENCOUNTER — Ambulatory Visit (INDEPENDENT_AMBULATORY_CARE_PROVIDER_SITE_OTHER): Payer: 59 | Admitting: Physician Assistant

## 2019-08-12 VITALS — BP 129/85 | HR 112 | Ht 72.0 in

## 2019-08-12 DIAGNOSIS — Z6838 Body mass index (BMI) 38.0-38.9, adult: Secondary | ICD-10-CM | POA: Diagnosis not present

## 2019-08-12 DIAGNOSIS — E1169 Type 2 diabetes mellitus with other specified complication: Secondary | ICD-10-CM

## 2019-08-12 DIAGNOSIS — E119 Type 2 diabetes mellitus without complications: Secondary | ICD-10-CM | POA: Insufficient documentation

## 2019-08-12 DIAGNOSIS — E66812 Obesity, class 2: Secondary | ICD-10-CM | POA: Insufficient documentation

## 2019-08-12 LAB — GLUCOSE, POCT (MANUAL RESULT ENTRY): POC Glucose: 89 mg/dl (ref 70–99)

## 2019-08-12 NOTE — Progress Notes (Signed)
   Subjective:    Patient ID: Kenneth Mcbride, male    DOB: June 02, 1985, 34 y.o.   MRN: [id number]  HPI  Pt is a 34 yo male with new diabetes who presents to the clinic to establish care and discuss glucose and need for surgery.   His last a1c was  .[name] Lab Results  Component Value Date   HGBA1C 7.3 (H) 07/21/2019   He did not start medication and dumonski canceled surgery. His is having lumbar decompression surgery.   He has made diet changes and taking metformin for 2 weeks. His sugars are ranging 90 to 120s in mornings and evenings.    .. Active Ambulatory Problems    Diagnosis Date Noted  . Closed fracture of proximal phalanx of third finger of left hand 11/28/2013  . Tendinitis of left hand 01/16/2014  . Lumbar spinal stenosis 05/20/2019   Resolved Ambulatory Problems    Diagnosis Date Noted  . No Resolved Ambulatory Problems   Past Medical History:  Diagnosis Date  . Diabetes mellitus without complication (HCC)   . Family history of adverse reaction to anesthesia   . No pertinent past medical history   . Spinal stenosis     Review of Systems See HPI.     Objective:   Physical Exam Vitals reviewed.  Constitutional:      Appearance: Normal appearance. He is obese.     Comments: Non ambulatory.  Cardiovascular:     Rate and Rhythm: Normal rate and regular rhythm.     Pulses: Normal pulses.  Pulmonary:     Effort: Pulmonary effort is normal.     Breath sounds: Normal breath sounds.  Neurological:     General: No focal deficit present.     Mental Status: He is alert and oriented to person, place, and time.  Psychiatric:        Mood and Affect: Mood normal.           Assessment & Plan:  Marland KitchenMarland KitchenPhilopater was seen today for diabetes.  Diagnoses and all orders for this visit:  Controlled type 2 diabetes mellitus with other specified complication, without long-term current use of insulin (HCC) -     POCT Glucose (CBG)  Class 2 severe obesity due to excess  calories with serious comorbidity and body mass index (BMI) of 38.0 to 38.9 in adult [facility])   .[name] Results for orders placed or performed in visit on 08/12/19  POCT Glucose (CBG)  Result Value Ref Range   POC Glucose 89 70 - 99 mg/dl   Too soon for F7P.  Random sugar is 89.  Pt is taking metformin. He is doing well. Checking AM and PM 90-120.  Discussed diabetic diet.  Letter written to Surgeon for surgical release.  Pt aware stay on metformin through surgery.  Follow up in 2 months.   Spent 30 minutes with patient.

## 2019-08-13 ENCOUNTER — Encounter: Payer: Self-pay | Admitting: Physician Assistant

## 2019-08-13 NOTE — Telephone Encounter (Signed)
Will you resend note to this fax and address. Thanks.

## 2019-08-13 NOTE — Telephone Encounter (Signed)
Resent to this number, attn: Tobey Bride with confirmation received.

## 2019-08-14 ENCOUNTER — Telehealth: Payer: Self-pay

## 2019-08-14 NOTE — Telephone Encounter (Signed)
Kenneth Mcbride reports some diarrhea with the Metformin. I did advise him to stop taking the stool softener that he takes daily. Also to call us back on Monday if he still has diarrhea.

## 2019-08-15 NOTE — Telephone Encounter (Signed)
Patient advised of recommendations.  

## 2019-08-15 NOTE — Telephone Encounter (Signed)
Make sure taking with a meal. He could also take immodium for the next few days. If diarrhea persistent we could consider extended release metformin which can be a little easier on stomach.

## 2019-08-18 ENCOUNTER — Other Ambulatory Visit: Payer: Self-pay | Admitting: Orthopedic Surgery

## 2019-08-25 ENCOUNTER — Other Ambulatory Visit: Payer: Self-pay

## 2019-08-25 ENCOUNTER — Encounter (HOSPITAL_COMMUNITY)
Admission: RE | Admit: 2019-08-25 | Discharge: 2019-08-25 | Disposition: A | Discharge status: Home / Self Care | Payer: 59 | Source: Ambulatory Visit | Attending: Orthopedic Surgery | Admitting: Orthopedic Surgery

## 2019-08-25 ENCOUNTER — Encounter (HOSPITAL_COMMUNITY): Payer: Self-pay

## 2019-08-25 ENCOUNTER — Other Ambulatory Visit (HOSPITAL_COMMUNITY)
Admission: RE | Admit: 2019-08-25 | Discharge: 2019-08-25 | Disposition: A | Discharge status: Home / Self Care | Payer: 59 | Source: Ambulatory Visit | Attending: Orthopedic Surgery | Admitting: Orthopedic Surgery

## 2019-08-25 DIAGNOSIS — Z20822 Contact with and (suspected) exposure to covid-19: Secondary | ICD-10-CM | POA: Diagnosis not present

## 2019-08-25 DIAGNOSIS — Z01812 Encounter for preprocedural laboratory examination: Secondary | ICD-10-CM | POA: Insufficient documentation

## 2019-08-25 LAB — COMPREHENSIVE METABOLIC PANEL
ALT: 39 U/L (ref 0–44)
AST: 27 U/L (ref 15–41)
Albumin: 4.7 g/dL (ref 3.5–5.0)
Alkaline Phosphatase: 75 U/L (ref 38–126)
Anion gap: 11 (ref 5–15)
BUN: 11 mg/dL (ref 6–20)
CO2: 23 mmol/L (ref 22–32)
Calcium: 9.7 mg/dL (ref 8.9–10.3)
Chloride: 105 mmol/L (ref 98–111)
Creatinine, Ser: 0.93 mg/dL (ref 0.61–1.24)
GFR calc Af Amer: >60 mL/min (ref >60)
GFR calc non Af Amer: >60 mL/min (ref >60)
Glucose, Bld: 88 mg/dL (ref 70–99)
Potassium: 3.5 mmol/L (ref 3.5–5.1)
Sodium: 139 mmol/L (ref 135–145)
Total Bilirubin: 1.1 mg/dL (ref 0.3–1.2)
Total Protein: 7.6 g/dL (ref 6.5–8.1)

## 2019-08-25 LAB — CBC WITH DIFFERENTIAL/PLATELET
Abs Immature Granulocytes: 0.03 10*3/uL (ref 0.00–0.07)
Basophils Absolute: 0.1 10*3/uL (ref 0.0–0.1)
Basophils Relative: 1 %
Eosinophils Absolute: 0.1 10*3/uL (ref 0.0–0.5)
Eosinophils Relative: 1 %
HCT: 50.7 % (ref 39.0–52.0)
Hemoglobin: 16.7 g/dL (ref 13.0–17.0)
Immature Granulocytes: 0 %
Lymphocytes Relative: 45 %
Lymphs Abs: 4.6 10*3/uL — ABNORMAL HIGH (ref 0.7–4.0)
MCH: 28.2 pg (ref 26.0–34.0)
MCHC: 32.9 g/dL (ref 30.0–36.0)
MCV: 85.6 fL (ref 80.0–100.0)
Monocytes Absolute: 0.7 10*3/uL (ref 0.1–1.0)
Monocytes Relative: 7 %
Neutro Abs: 4.8 10*3/uL (ref 1.7–7.7)
Neutrophils Relative %: 46 %
Platelets: 273 10*3/uL (ref 150–400)
RBC: 5.92 MIL/uL — ABNORMAL HIGH (ref 4.22–5.81)
RDW: 12.6 % (ref 11.5–15.5)
WBC: 10.3 10*3/uL (ref 4.0–10.5)
nRBC: 0 % (ref 0.0–0.2)

## 2019-08-25 LAB — URINALYSIS, ROUTINE W REFLEX MICROSCOPIC
Bilirubin Urine: NEGATIVE
Glucose, UA: NEGATIVE mg/dL
Hgb urine dipstick: NEGATIVE
Ketones, ur: NEGATIVE mg/dL
Leukocytes,Ua: NEGATIVE
Nitrite: NEGATIVE
Protein, ur: NEGATIVE mg/dL
Specific Gravity, Urine: 1.014 (ref 1.005–1.030)
pH: 5 (ref 5.0–8.0)

## 2019-08-25 LAB — SARS CORONAVIRUS 2 (TAT 6-24 HRS): SARS Coronavirus 2: NEGATIVE

## 2019-08-25 LAB — PROTIME-INR
INR: 1.1 (ref 0.8–1.2)
Prothrombin Time: 14.2 seconds (ref 11.4–15.2)

## 2019-08-25 LAB — APTT: aPTT: 27 seconds (ref 24–36)

## 2019-08-25 LAB — GLUCOSE, CAPILLARY: Glucose-Capillary: 86 mg/dL (ref 70–99)

## 2019-08-25 NOTE — Progress Notes (Signed)
[facility] [id number], [place] [address] [place] [address] Phone: [phone] Fax: [id number] - [place], [employer] [employer] [address] [place] [address] Phone: [phone] Fax: [phone]      Your procedure is scheduled on Wednesday June 30  Report to [facility] Main Entrance "A" at 0630 A.M. per Dr Lynann Bologna, and check in at the Admitting office.  Call this number if you have problems the morning of surgery:  [phone]  Call [phone] if you have any questions prior to your surgery date Monday-Friday 8am-4pm    Remember:  Do not eat after midnight the night before your surgery  You may drink clear liquids until 0630 am the morning of your surgery.   Clear liquids allowed are: Water, Non-Citrus Juices (without pulp), Carbonated Beverages, Clear Tea, Black Coffee Only, and Gatorade     Enhanced Recovery after Surgery for Orthopedics Enhanced Recovery after Surgery is a protocol used to improve the stress on your body and your recovery after surgery.  Patient Instructions  . The night before surgery:  o No food after midnight. ONLY clear liquids after midnight  . The day of surgery (if you have diabetes): o  o Drink ONE (1) Gatorade 2 (G2) by 0630 am the morning of surgery. o This drink was given to you during your hospital  pre-op appointment visit.  o Color of the Gatorade may vary. Red is not allowed. o Nothing else to drink after completing the  Gatorade 2 (G2).         If you have questions, please contact your surgeon's office.   Take these medicines the morning of surgery with A SIP OF WATER acetaminophen (TYLENOL) if needed gabapentin (NEURONTIN)   As of today, STOP taking any Aspirin (unless otherwise instructed by your surgeon) and Aspirin containing products, Aleve, Naproxen, Ibuprofen, Motrin, Advil, Goody's, BC's, all herbal medications,  fish oil, and all vitamins.   WHAT DO I DO ABOUT MY DIABETES MEDICATION?   [name] Do not take oral diabetes medicines (pills) the morning of surgery. metFORMIN (GLUCOPHAGE)  HOW TO MANAGE YOUR DIABETES BEFORE AND AFTER SURGERY  Why is it important to control my blood sugar before and after surgery? . Improving blood sugar levels before and after surgery helps healing and can limit problems. . A way of improving blood sugar control is eating a healthy diet by: o  Eating less sugar and carbohydrates o  Increasing activity/exercise o  Talking with your doctor about reaching your blood sugar goals . High blood sugars (greater than 180 mg/dL) can raise your risk of infections and slow your recovery, so you will need to focus on controlling your diabetes during the weeks before surgery. . Make sure that the doctor who takes care of your diabetes knows about your planned surgery including the date and location.  How do I manage my blood sugar before surgery? . Check your blood sugar at least 4 times a day, starting 2 days before surgery, to make sure that the level is not too high or low. . Check your blood sugar the morning of your surgery when you wake up and every 2 hours until you get to the Short Stay unit. o If your blood sugar is less than 70 mg/dL, you will need to treat for low blood sugar: - Do not take insulin. - Treat a low  blood sugar (less than 70 mg/dL) with  cup of clear juice (cranberry or apple), 4 glucose tablets, OR glucose gel. - Recheck blood sugar in 15 minutes after treatment (to make sure it is greater than 70 mg/dL). If your blood sugar is not greater than 70 mg/dL on recheck, call [phone] for further instructions. . Report your blood sugar to the short stay nurse when you get to Short Stay.  . If you are admitted to the hospital after surgery: o Your blood sugar will be checked by the staff and you will probably be given insulin after surgery (instead of oral  diabetes medicines) to make sure you have good blood sugar levels. o The goal for blood sugar control after surgery is 80-180 mg/dL.                     Do not wear jewelry            Do not wear lotions, powders, colognes, or deodorant.            Men may shave face and neck.            Do not bring valuables to the hospital.            [facility] is not responsible for any belongings or valuables.  Do NOT Smoke (Tobacco/Vapping) or drink Alcohol 24 hours prior to your procedure If you use a CPAP at night, you may bring all equipment for your overnight stay.   Contacts, glasses, dentures or bridgework may not be worn into surgery.      For patients admitted to the hospital, discharge time will be determined by your treatment team.   Patients discharged the day of surgery will not be allowed to drive home, and someone needs to stay with them for 24 hours.    Special instructions:   [facility]- Preparing For Surgery  Before surgery, you can play an important role. Because skin is not sterile, your skin needs to be as free of germs as possible. You can reduce the number of germs on your skin by washing with CHG (chlorahexidine gluconate) Soap before surgery.  CHG is an antiseptic cleaner which kills germs and bonds with the skin to continue killing germs even after washing.    Oral Hygiene is also important to reduce your risk of infection.  Remember - BRUSH YOUR TEETH THE MORNING OF SURGERY WITH YOUR REGULAR TOOTHPASTE  Please do not use if you have an allergy to CHG or antibacterial soaps. If your skin becomes reddened/irritated stop using the CHG.  Do not shave (including legs and underarms) for at least 48 hours prior to first CHG shower. It is OK to shave your face.  Please follow these instructions carefully.   1. Shower the NIGHT BEFORE SURGERY and the MORNING OF SURGERY with CHG Soap.   2. If you chose to wash your hair, wash your hair first as usual with your normal  shampoo.  3. After you shampoo, rinse your hair and body thoroughly to remove the shampoo.  4. Use CHG as you would any other liquid soap. You can apply CHG directly to the skin and wash gently with a scrungie or a clean washcloth.   5. Apply the CHG Soap to your body ONLY FROM THE NECK DOWN.  Do not use on open wounds or open sores. Avoid contact with your eyes, ears, mouth and genitals (private parts). Wash Face and genitals (private parts)  with your normal soap.   6. Wash thoroughly, paying special attention to the area where your surgery will be performed.  7. Thoroughly rinse your body with warm water from the neck down.  8. DO NOT shower/wash with your normal soap after using and rinsing off the CHG Soap.  9. Pat yourself dry with a CLEAN TOWEL.  10. Wear CLEAN PAJAMAS to bed the night before surgery, wear comfortable clothes the morning of surgery  11. Place CLEAN SHEETS on your bed the night of your first shower and DO NOT SLEEP WITH PETS.   Day of Surgery:   Do not apply any deodorants/lotions.  Please wear clean clothes to the hospital/surgery center.   Remember to brush your teeth WITH YOUR REGULAR TOOTHPASTE.   Please read over the following fact sheets that you were given.

## 2019-08-25 NOTE — Progress Notes (Signed)
PCP - Tandy Gaw Cardiologist - denies  Chest x-ray - n/a EKG - 07/30/19 Stress Test - denies ECHO - denies Cardiac Cath - denies  Fasting Blood Sugar - 100s Checks Blood Sugar __2___ times a day  ERAS Protcol -yes  PRE-SURGERY Ensure or G2- G2 given  COVID TEST- 08/25/19   Anesthesia review: yes Fayrene Fearing wrote note on 07/30/19  Patient denies shortness of breath, fever, cough and chest pain at PAT appointment   All instructions explained to the patient, with a verbal understanding of the material. Patient agrees to go over the instructions while at home for a better understanding. Patient also instructed to self quarantine after being tested for COVID-19. The opportunity to ask questions was provided.

## 2019-08-26 MED ORDER — DEXTROSE 5 % IV SOLN
3.0000 g | INTRAVENOUS | Status: AC
  Administered 2019-08-27 (×2): 3 g via INTRAVENOUS
  Filled 2019-08-26: qty 3

## 2019-08-26 NOTE — Anesthesia Preprocedure Evaluation (Addendum)
Anesthesia Evaluation  Patient identified by MRN, date of birth, ID band Patient awake    Reviewed: Allergy & Precautions, NPO status , [name] Chart, lab work & pertinent test results  Airway Mallampati: III  TM Distance: >3 FB Neck ROM: Full    Dental no notable dental hx. (+) Poor Dentition, Dental Advisory Given,    Pulmonary neg pulmonary ROS,    Pulmonary exam normal breath sounds clear to auscultation       Cardiovascular negative cardio ROS Normal cardiovascular exam Rhythm:Regular Rate:Normal     Neuro/Psych negative neurological ROS  negative psych ROS   GI/Hepatic negative GI ROS, Neg liver ROS,   Endo/Other  negative endocrine ROSdiabetes, Oral Hypoglycemic Agents  Renal/GU negative Renal ROS  negative genitourinary   Musculoskeletal  (+) Arthritis ,   Abdominal   Peds  Hematology negative hematology ROS (+)   Anesthesia Other Findings   Reproductive/Obstetrics                            Anesthesia Physical Anesthesia Plan  ASA: II  Anesthesia Plan: General   Post-op Pain Management:    Induction: Intravenous  PONV Risk Score and Plan: 2 and Midazolam, Dexamethasone and Ondansetron  Airway Management Planned: Oral ETT  Additional Equipment:   Intra-op Plan:   Post-operative Plan: Extubation in OR  Informed Consent: I have reviewed the patients History and Physical, chart, labs and discussed the procedure including the risks, benefits and alternatives for the proposed anesthesia with the patient or authorized representative who has indicated his/her understanding and acceptance.     Dental advisory given  Plan Discussed with: CRNA  Anesthesia Plan Comments: ( )       Anesthesia Quick Evaluation

## 2019-08-27 ENCOUNTER — Other Ambulatory Visit: Payer: Self-pay

## 2019-08-27 ENCOUNTER — Ambulatory Visit (HOSPITAL_COMMUNITY): Payer: 59

## 2019-08-27 ENCOUNTER — Observation Stay (HOSPITAL_COMMUNITY)
Admission: RE | Admit: 2019-08-27 | Discharge: 2019-08-29 | Disposition: A | Discharge status: Home / Self Care | Payer: 59 | Attending: Orthopedic Surgery | Admitting: Orthopedic Surgery

## 2019-08-27 ENCOUNTER — Encounter (HOSPITAL_COMMUNITY)
Admission: RE | Disposition: A | Discharge status: Home / Self Care | Payer: Self-pay | Source: Home / Self Care | Attending: Orthopedic Surgery

## 2019-08-27 ENCOUNTER — Ambulatory Visit (HOSPITAL_COMMUNITY): Payer: 59 | Admitting: Vascular Surgery

## 2019-08-27 ENCOUNTER — Encounter (HOSPITAL_COMMUNITY): Payer: Self-pay | Admitting: Orthopedic Surgery

## 2019-08-27 ENCOUNTER — Ambulatory Visit (HOSPITAL_COMMUNITY): Payer: 59 | Admitting: Anesthesiology

## 2019-08-27 DIAGNOSIS — M5126 Other intervertebral disc displacement, lumbar region: Secondary | ICD-10-CM | POA: Diagnosis not present

## 2019-08-27 DIAGNOSIS — Z8249 Family history of ischemic heart disease and other diseases of the circulatory system: Secondary | ICD-10-CM | POA: Insufficient documentation

## 2019-08-27 DIAGNOSIS — M48061 Spinal stenosis, lumbar region without neurogenic claudication: Principal | ICD-10-CM | POA: Insufficient documentation

## 2019-08-27 DIAGNOSIS — Z419 Encounter for procedure for purposes other than remedying health state, unspecified: Secondary | ICD-10-CM

## 2019-08-27 DIAGNOSIS — M4800 Spinal stenosis, site unspecified: Secondary | ICD-10-CM | POA: Diagnosis present

## 2019-08-27 DIAGNOSIS — Z7984 Long term (current) use of oral hypoglycemic drugs: Secondary | ICD-10-CM | POA: Diagnosis not present

## 2019-08-27 DIAGNOSIS — Z79899 Other long term (current) drug therapy: Secondary | ICD-10-CM | POA: Diagnosis not present

## 2019-08-27 DIAGNOSIS — E119 Type 2 diabetes mellitus without complications: Secondary | ICD-10-CM | POA: Diagnosis not present

## 2019-08-27 DIAGNOSIS — M1990 Unspecified osteoarthritis, unspecified site: Secondary | ICD-10-CM | POA: Diagnosis not present

## 2019-08-27 HISTORY — PX: LUMBAR LAMINECTOMY/DECOMPRESSION MICRODISCECTOMY: SHX5026

## 2019-08-27 LAB — GLUCOSE, CAPILLARY
Glucose-Capillary: 95 mg/dL (ref 70–99)
Glucose-Capillary: 176 mg/dL — ABNORMAL HIGH (ref 70–99)
Glucose-Capillary: 198 mg/dL — ABNORMAL HIGH (ref 70–99)
Glucose-Capillary: 148 mg/dL — ABNORMAL HIGH (ref 70–99)

## 2019-08-27 LAB — TYPE AND SCREEN
ABO/RH(D): O NEG
Antibody Screen: NEGATIVE

## 2019-08-27 SURGERY — LUMBAR LAMINECTOMY/DECOMPRESSION MICRODISCECTOMY
Anesthesia: General | Site: Spine Lumbar

## 2019-08-27 MED ORDER — DOCUSATE SODIUM 100 MG PO CAPS
100.0000 mg | ORAL_CAPSULE | Freq: Two times a day (BID) | ORAL | Status: DC
  Administered 2019-08-28 – 2019-08-29 (×3): 100 mg via ORAL
  Filled 2019-08-27 (×4): qty 1

## 2019-08-27 MED ORDER — BUPIVACAINE HCL (PF) 0.25 % IJ SOLN
INTRAMUSCULAR | Status: DC | PRN
  Administered 2019-08-27: 20 mL
  Administered 2019-08-27: 9 mL

## 2019-08-27 MED ORDER — PANTOPRAZOLE SODIUM 40 MG IV SOLR: 40.0000 mg | Freq: Every day | INTRAVENOUS | Status: DC

## 2019-08-27 MED ORDER — SENNOSIDES-DOCUSATE SODIUM 8.6-50 MG PO TABS: 1.0000 | ORAL_TABLET | Freq: Every evening | ORAL | Status: DC | PRN

## 2019-08-27 MED ORDER — METHOCARBAMOL 500 MG PO TABS
500.0000 mg | ORAL_TABLET | Freq: Four times a day (QID) | ORAL | Status: DC | PRN
  Administered 2019-08-27 – 2019-08-29 (×5): 500 mg via ORAL
  Filled 2019-08-27 (×5): qty 1

## 2019-08-27 MED ORDER — SODIUM CHLORIDE 0.9% FLUSH
3.0000 mL | Freq: Two times a day (BID) | INTRAVENOUS | Status: DC
  Administered 2019-08-27 – 2019-08-28 (×2): 3 mL via INTRAVENOUS

## 2019-08-27 MED ORDER — LIDOCAINE 2% (20 MG/ML) 5 ML SYRINGE
INTRAMUSCULAR | Status: DC | PRN
  Administered 2019-08-27: 100 mg via INTRAVENOUS

## 2019-08-27 MED ORDER — PANTOPRAZOLE SODIUM 40 MG PO TBEC
40.0000 mg | DELAYED_RELEASE_TABLET | Freq: Every day | ORAL | Status: DC
  Administered 2019-08-27 – 2019-08-29 (×3): 40 mg via ORAL
  Filled 2019-08-27 (×3): qty 1

## 2019-08-27 MED ORDER — DEXAMETHASONE SODIUM PHOSPHATE 10 MG/ML IJ SOLN
INTRAMUSCULAR | Status: AC
  Filled 2019-08-27: qty 1

## 2019-08-27 MED ORDER — SODIUM CHLORIDE 0.9% FLUSH: 3.0000 mL | INTRAVENOUS | Status: DC | PRN

## 2019-08-27 MED ORDER — ROCURONIUM BROMIDE 10 MG/ML (PF) SYRINGE
PREFILLED_SYRINGE | INTRAVENOUS | Status: AC
  Filled 2019-08-27: qty 10

## 2019-08-27 MED ORDER — PHENOL 1.4 % MT LIQD: 1.0000 | OROMUCOSAL | Status: DC | PRN

## 2019-08-27 MED ORDER — MENTHOL 3 MG MT LOZG: 1.0000 | LOZENGE | OROMUCOSAL | Status: DC | PRN

## 2019-08-27 MED ORDER — FENTANYL CITRATE (PF) 250 MCG/5ML IJ SOLN
INTRAMUSCULAR | Status: AC
  Filled 2019-08-27: qty 5

## 2019-08-27 MED ORDER — DEXAMETHASONE SODIUM PHOSPHATE 4 MG/ML IJ SOLN
INTRAMUSCULAR | Status: DC | PRN
  Administered 2019-08-27: 5 mg via INTRAVENOUS

## 2019-08-27 MED ORDER — PHENYLEPHRINE 40 MCG/ML (10ML) SYRINGE FOR IV PUSH (FOR BLOOD PRESSURE SUPPORT)
PREFILLED_SYRINGE | INTRAVENOUS | Status: DC | PRN
  Administered 2019-08-27: 80 ug via INTRAVENOUS
  Administered 2019-08-27 (×2): 40 ug via INTRAVENOUS
  Administered 2019-08-27 (×2): 80 ug via INTRAVENOUS

## 2019-08-27 MED ORDER — SUGAMMADEX SODIUM 200 MG/2ML IV SOLN
INTRAVENOUS | Status: DC | PRN
  Administered 2019-08-27: 250 mg via INTRAVENOUS

## 2019-08-27 MED ORDER — ONDANSETRON HCL 4 MG/2ML IJ SOLN: 4.0000 mg | Freq: Four times a day (QID) | INTRAMUSCULAR | Status: DC | PRN

## 2019-08-27 MED ORDER — ALUM & MAG HYDROXIDE-SIMETH 200-200-20 MG/5ML PO SUSP: 30.0000 mL | Freq: Four times a day (QID) | ORAL | Status: DC | PRN

## 2019-08-27 MED ORDER — THROMBIN 20000 UNITS EX SOLR
CUTANEOUS | Status: AC
  Filled 2019-08-27: qty 20000

## 2019-08-27 MED ORDER — METHYLENE BLUE 0.5 % INJ SOLN
INTRAVENOUS | Status: DC | PRN
  Administered 2019-08-27: 1 mL via SUBMUCOSAL

## 2019-08-27 MED ORDER — CEFAZOLIN SODIUM-DEXTROSE 2-4 GM/100ML-% IV SOLN
2.0000 g | Freq: Three times a day (TID) | INTRAVENOUS | Status: AC
  Administered 2019-08-27 (×2): 2 g via INTRAVENOUS
  Filled 2019-08-27 (×2): qty 100

## 2019-08-27 MED ORDER — SODIUM CHLORIDE 0.9 % IV SOLN
250.0000 mL | INTRAVENOUS | Status: DC
  Administered 2019-08-27: 250 mL via INTRAVENOUS

## 2019-08-27 MED ORDER — SODIUM CHLORIDE 0.9 % IV SOLN: INTRAVENOUS | Status: DC | PRN

## 2019-08-27 MED ORDER — MIDAZOLAM HCL 5 MG/5ML IJ SOLN
INTRAMUSCULAR | Status: DC | PRN
  Administered 2019-08-27: 2 mg via INTRAVENOUS

## 2019-08-27 MED ORDER — BISACODYL 5 MG PO TBEC: 5.0000 mg | DELAYED_RELEASE_TABLET | Freq: Every day | ORAL | Status: DC | PRN

## 2019-08-27 MED ORDER — HEMOSTATIC AGENTS (NO CHARGE) OPTIME
TOPICAL | Status: DC | PRN
  Administered 2019-08-27 (×2): 1 via TOPICAL

## 2019-08-27 MED ORDER — THROMBIN 20000 UNITS EX SOLR
CUTANEOUS | Status: DC | PRN
  Administered 2019-08-27 (×2): 20000 [IU] via TOPICAL

## 2019-08-27 MED ORDER — LIDOCAINE 2% (20 MG/ML) 5 ML SYRINGE
INTRAMUSCULAR | Status: AC
  Filled 2019-08-27: qty 5

## 2019-08-27 MED ORDER — EPINEPHRINE PF 1 MG/ML IJ SOLN
INTRAMUSCULAR | Status: AC
  Filled 2019-08-27: qty 1

## 2019-08-27 MED ORDER — METHYLENE BLUE 0.5 % INJ SOLN
INTRAVENOUS | Status: AC
  Filled 2019-08-27: qty 10

## 2019-08-27 MED ORDER — ACETAMINOPHEN 500 MG PO TABS
ORAL_TABLET | ORAL | Status: AC
  Administered 2019-08-27: 500 mg via ORAL
  Filled 2019-08-27: qty 1

## 2019-08-27 MED ORDER — ONDANSETRON HCL 4 MG/2ML IJ SOLN
INTRAMUSCULAR | Status: DC | PRN
  Administered 2019-08-27: 4 mg via INTRAVENOUS

## 2019-08-27 MED ORDER — ONDANSETRON HCL 4 MG PO TABS: 4.0000 mg | ORAL_TABLET | Freq: Four times a day (QID) | ORAL | Status: DC | PRN

## 2019-08-27 MED ORDER — HEMOSTATIC AGENTS (NO CHARGE) OPTIME
TOPICAL | Status: DC | PRN
  Administered 2019-08-27: 1 via TOPICAL

## 2019-08-27 MED ORDER — ACETAMINOPHEN 650 MG RE SUPP: 650.0000 mg | RECTAL | Status: DC | PRN

## 2019-08-27 MED ORDER — ORAL CARE MOUTH RINSE: 15.0000 mL | Freq: Once | OROMUCOSAL | Status: AC

## 2019-08-27 MED ORDER — BUPIVACAINE LIPOSOME 1.3 % IJ SUSP
20.0000 mL | Freq: Once | INTRAMUSCULAR | Status: DC
  Filled 2019-08-27: qty 20

## 2019-08-27 MED ORDER — METHOCARBAMOL 1000 MG/10ML IJ SOLN
500.0000 mg | Freq: Four times a day (QID) | INTRAVENOUS | Status: DC | PRN
  Filled 2019-08-27: qty 5

## 2019-08-27 MED ORDER — PROPOFOL 10 MG/ML IV BOLUS
INTRAVENOUS | Status: AC
  Filled 2019-08-27: qty 20

## 2019-08-27 MED ORDER — ACETAMINOPHEN 325 MG PO TABS: 650.0000 mg | ORAL_TABLET | ORAL | Status: DC | PRN

## 2019-08-27 MED ORDER — ROCURONIUM BROMIDE 10 MG/ML (PF) SYRINGE
PREFILLED_SYRINGE | INTRAVENOUS | Status: DC | PRN
  Administered 2019-08-27: 10 mg via INTRAVENOUS
  Administered 2019-08-27: 100 mg via INTRAVENOUS
  Administered 2019-08-27: 20 mg via INTRAVENOUS

## 2019-08-27 MED ORDER — PHENYLEPHRINE HCL-NACL 10-0.9 MG/250ML-% IV SOLN
INTRAVENOUS | Status: DC | PRN
  Administered 2019-08-27: 20 ug/min via INTRAVENOUS

## 2019-08-27 MED ORDER — POVIDONE-IODINE 7.5 % EX SOLN
Freq: Once | CUTANEOUS | Status: DC
  Filled 2019-08-27: qty 118

## 2019-08-27 MED ORDER — ONDANSETRON HCL 4 MG/2ML IJ SOLN
INTRAMUSCULAR | Status: AC
  Filled 2019-08-27: qty 2

## 2019-08-27 MED ORDER — MIDAZOLAM HCL 2 MG/2ML IJ SOLN
INTRAMUSCULAR | Status: AC
  Filled 2019-08-27: qty 2

## 2019-08-27 MED ORDER — OXYCODONE-ACETAMINOPHEN 5-325 MG PO TABS
1.0000 | ORAL_TABLET | ORAL | Status: DC | PRN
  Administered 2019-08-27: 1 via ORAL
  Administered 2019-08-28 – 2019-08-29 (×4): 2 via ORAL
  Filled 2019-08-27 (×3): qty 2
  Filled 2019-08-27 (×3): qty 1

## 2019-08-27 MED ORDER — CHLORHEXIDINE GLUCONATE 0.12 % MT SOLN
15.0000 mL | Freq: Once | OROMUCOSAL | Status: AC
  Administered 2019-08-27: 15 mL via OROMUCOSAL
  Filled 2019-08-27: qty 15

## 2019-08-27 MED ORDER — BUPIVACAINE HCL (PF) 0.25 % IJ SOLN
INTRAMUSCULAR | Status: AC
  Filled 2019-08-27: qty 30

## 2019-08-27 MED ORDER — 0.9 % SODIUM CHLORIDE (POUR BTL) OPTIME
TOPICAL | Status: DC | PRN
  Administered 2019-08-27 (×3): 1000 mL

## 2019-08-27 MED ORDER — METFORMIN HCL 500 MG PO TABS
500.0000 mg | ORAL_TABLET | Freq: Two times a day (BID) | ORAL | Status: DC
  Administered 2019-08-27 – 2019-08-29 (×4): 500 mg via ORAL
  Filled 2019-08-27 (×4): qty 1

## 2019-08-27 MED ORDER — PHENYLEPHRINE 40 MCG/ML (10ML) SYRINGE FOR IV PUSH (FOR BLOOD PRESSURE SUPPORT)
PREFILLED_SYRINGE | INTRAVENOUS | Status: AC
  Filled 2019-08-27: qty 10

## 2019-08-27 MED ORDER — FLEET ENEMA 7-19 GM/118ML RE ENEM: 1.0000 | ENEMA | Freq: Once | RECTAL | Status: DC | PRN

## 2019-08-27 MED ORDER — POTASSIUM 99 MG PO TABS: 99.0000 mg | ORAL_TABLET | Freq: Two times a day (BID) | ORAL | Status: DC

## 2019-08-27 MED ORDER — PROPOFOL 10 MG/ML IV BOLUS
INTRAVENOUS | Status: DC | PRN
  Administered 2019-08-27: 200 mg via INTRAVENOUS

## 2019-08-27 MED ORDER — LACTATED RINGERS IV SOLN: INTRAVENOUS | Status: DC

## 2019-08-27 MED ORDER — ADULT MULTIVITAMIN W/MINERALS CH
1.0000 | ORAL_TABLET | Freq: Every day | ORAL | Status: DC
  Administered 2019-08-28 – 2019-08-29 (×2): 1 via ORAL
  Filled 2019-08-27 (×2): qty 1

## 2019-08-27 MED ORDER — EPINEPHRINE PF 1 MG/ML IJ SOLN
INTRAMUSCULAR | Status: DC | PRN
  Administered 2019-08-27: .15 mL

## 2019-08-27 MED ORDER — ZOLPIDEM TARTRATE 5 MG PO TABS: 5.0000 mg | ORAL_TABLET | Freq: Every evening | ORAL | Status: DC | PRN

## 2019-08-27 MED ORDER — METHYLPREDNISOLONE ACETATE 40 MG/ML IJ SUSP
INTRAMUSCULAR | Status: AC
  Filled 2019-08-27: qty 1

## 2019-08-27 MED ORDER — GABAPENTIN 600 MG PO TABS
600.0000 mg | ORAL_TABLET | Freq: Three times a day (TID) | ORAL | Status: DC
  Administered 2019-08-27 – 2019-08-29 (×6): 600 mg via ORAL
  Filled 2019-08-27 (×6): qty 1

## 2019-08-27 MED ORDER — FENTANYL CITRATE (PF) 100 MCG/2ML IJ SOLN
INTRAMUSCULAR | Status: DC | PRN
  Administered 2019-08-27 (×2): 50 ug via INTRAVENOUS
  Administered 2019-08-27: 100 ug via INTRAVENOUS
  Administered 2019-08-27 (×6): 50 ug via INTRAVENOUS

## 2019-08-27 MED ORDER — ACETAMINOPHEN 500 MG PO TABS: 500.0000 mg | ORAL_TABLET | ORAL | Status: AC

## 2019-08-27 MED ORDER — BUPIVACAINE LIPOSOME 1.3 % IJ SUSP
INTRAMUSCULAR | Status: DC | PRN
  Administered 2019-08-27: 20 mL

## 2019-08-27 SURGICAL SUPPLY — 80 items
BENZOIN TINCTURE PRP APPL 2/3 (GAUZE/BANDAGES/DRESSINGS) IMPLANT
BUR PRECISION FLUTE 5.0 (BURR) ×2 IMPLANT
CABLE BIPOLOR RESECTION CORD (MISCELLANEOUS) ×2 IMPLANT
CANISTER SUCT 3000ML PPV (MISCELLANEOUS) ×2 IMPLANT
CARTRIDGE OIL MAESTRO DRILL (MISCELLANEOUS) ×1 IMPLANT
CLSR STERI-STRIP ANTIMIC 1/2X4 (GAUZE/BANDAGES/DRESSINGS) ×4 IMPLANT
COVER SURGICAL LIGHT HANDLE (MISCELLANEOUS) ×4 IMPLANT
COVER WAND RF STERILE (DRAPES) ×2 IMPLANT
DIFFUSER DRILL AIR PNEUMATIC (MISCELLANEOUS) ×2 IMPLANT
DRAIN CHANNEL 15F RND FF W/TCR (WOUND CARE) ×2 IMPLANT
DRAPE POUCH INSTRU U-SHP 10X18 (DRAPES) ×4 IMPLANT
DRAPE SURG 17X23 STRL (DRAPES) ×8 IMPLANT
DURAPREP 26ML APPLICATOR (WOUND CARE) ×2 IMPLANT
DURASEAL APPLICATOR TIP (TIP) ×2 IMPLANT
DURASEAL SPINE SEALANT 3ML (MISCELLANEOUS) ×2 IMPLANT
ELECT BLADE 4.0 EZ CLEAN MEGAD (MISCELLANEOUS) ×2
ELECT CAUTERY BLADE 6.4 (BLADE) ×2 IMPLANT
ELECT REM PT RETURN 9FT ADLT (ELECTROSURGICAL) ×2
ELECTRODE BLDE 4.0 EZ CLN MEGD (MISCELLANEOUS) ×1 IMPLANT
ELECTRODE REM PT RTRN 9FT ADLT (ELECTROSURGICAL) ×1 IMPLANT
EVACUATOR SILICONE 100CC (DRAIN) ×2 IMPLANT
FILTER STRAW FLUID ASPIR (MISCELLANEOUS) ×2 IMPLANT
GAUZE 4X4 16PLY RFD (DISPOSABLE) ×6 IMPLANT
GAUZE SPONGE 4X4 12PLY STRL (GAUZE/BANDAGES/DRESSINGS) ×2 IMPLANT
GAUZE SPONGE 4X4 12PLY STRL LF (GAUZE/BANDAGES/DRESSINGS) ×2 IMPLANT
GLOVE BIO SURGEON STRL SZ7 (GLOVE) ×10 IMPLANT
GLOVE BIO SURGEON STRL SZ8 (GLOVE) ×2 IMPLANT
GLOVE BIOGEL PI IND STRL 7.0 (GLOVE) ×1 IMPLANT
GLOVE BIOGEL PI IND STRL 8 (GLOVE) ×1 IMPLANT
GLOVE BIOGEL PI INDICATOR 7.0 (GLOVE) ×1
GLOVE BIOGEL PI INDICATOR 8 (GLOVE) ×1
GOWN STRL REUS W/ TWL LRG LVL3 (GOWN DISPOSABLE) ×5 IMPLANT
GOWN STRL REUS W/ TWL XL LVL3 (GOWN DISPOSABLE) ×2 IMPLANT
GOWN STRL REUS W/TWL LRG LVL3 (GOWN DISPOSABLE) ×5
GOWN STRL REUS W/TWL XL LVL3 (GOWN DISPOSABLE) ×2
IV CATH 14GX2 1/4 (CATHETERS) ×2 IMPLANT
KIT BASIN OR (CUSTOM PROCEDURE TRAY) ×2 IMPLANT
KIT POSITION SURG JACKSON T1 (MISCELLANEOUS) ×2 IMPLANT
KIT TURNOVER KIT B (KITS) ×2 IMPLANT
NEEDLE 18GX1X1/2 (RX/OR ONLY) (NEEDLE) ×4 IMPLANT
NEEDLE 22X1 1/2 (OR ONLY) (NEEDLE) ×2 IMPLANT
NEEDLE HYPO 25GX1X1/2 BEV (NEEDLE) ×2 IMPLANT
NEEDLE SPNL 18GX3.5 QUINCKE PK (NEEDLE) ×4 IMPLANT
NS IRRIG 1000ML POUR BTL (IV SOLUTION) ×2 IMPLANT
OIL CARTRIDGE MAESTRO DRILL (MISCELLANEOUS) ×2
PACK LAMINECTOMY ORTHO (CUSTOM PROCEDURE TRAY) ×2 IMPLANT
PACK UNIVERSAL I (CUSTOM PROCEDURE TRAY) ×2 IMPLANT
PAD ARMBOARD 7.5X6 YLW CONV (MISCELLANEOUS) ×4 IMPLANT
PATTIES SURGICAL .5 X.5 (GAUZE/BANDAGES/DRESSINGS) ×2 IMPLANT
PATTIES SURGICAL .5 X1 (DISPOSABLE) ×2 IMPLANT
PATTIES SURGICAL .75X.75 (GAUZE/BANDAGES/DRESSINGS) ×2 IMPLANT
SPONGE INTESTINAL PEANUT (DISPOSABLE) ×4 IMPLANT
SPONGE LAP 4X18 RFD (DISPOSABLE) ×2 IMPLANT
SPONGE SURGIFOAM ABS GEL 100 (HEMOSTASIS) ×2 IMPLANT
SPONGE SURGIFOAM ABS GEL SZ50 (HEMOSTASIS) ×2 IMPLANT
STRIP CLOSURE SKIN 1/2X4 (GAUZE/BANDAGES/DRESSINGS) IMPLANT
SURGIFLO W/THROMBIN 8M KIT (HEMOSTASIS) IMPLANT
SUT BONE WAX W31G (SUTURE) ×2 IMPLANT
SUT ETHILON 2 0 FS 18 (SUTURE) ×2 IMPLANT
SUT ETHILON 4 0 FS 1 (SUTURE) ×2 IMPLANT
SUT MNCRL AB 4-0 PS2 18 (SUTURE) ×2 IMPLANT
SUT PROLENE 6 0 C 1 24 (SUTURE) ×6 IMPLANT
SUT VIC AB 0 CT1 18XCR BRD 8 (SUTURE) IMPLANT
SUT VIC AB 0 CT1 27 (SUTURE)
SUT VIC AB 0 CT1 27XBRD ANBCTR (SUTURE) IMPLANT
SUT VIC AB 0 CT1 8-18 (SUTURE)
SUT VIC AB 1 CT1 18XCR BRD 8 (SUTURE) ×1 IMPLANT
SUT VIC AB 1 CT1 8-18 (SUTURE) ×1
SUT VIC AB 2-0 CT2 18 VCP726D (SUTURE) ×4 IMPLANT
SYR 20ML LL LF (SYRINGE) ×2 IMPLANT
SYR 3ML LL SCALE MARK (SYRINGE) ×2 IMPLANT
SYR BULB IRRIG 60ML STRL (SYRINGE) ×2 IMPLANT
SYR CONTROL 10ML LL (SYRINGE) ×4 IMPLANT
SYR TB 1ML 25GX5/8 (SYRINGE) ×4 IMPLANT
SYR TB 1ML LUER SLIP (SYRINGE) ×6 IMPLANT
TAPE CLOTH SURG 6X10 WHT LF (GAUZE/BANDAGES/DRESSINGS) ×2 IMPLANT
TOWEL GREEN STERILE (TOWEL DISPOSABLE) ×2 IMPLANT
TOWEL GREEN STERILE FF (TOWEL DISPOSABLE) ×2 IMPLANT
WATER STERILE IRR 1000ML POUR (IV SOLUTION) ×2 IMPLANT
YANKAUER SUCT BULB TIP NO VENT (SUCTIONS) ×2 IMPLANT

## 2019-08-27 NOTE — Transfer of Care (Signed)
Immediate Anesthesia Transfer of Care Note  Patient: Kenneth Mcbride  Procedure(s) Performed: LUMBAR 2-LUMBAR 5 DECOMPRESSION (N/A Spine Lumbar)  Patient Location: PACU  Anesthesia Type:General  Level of Consciousness: oriented, drowsy and patient cooperative  Airway & Oxygen Therapy: Patient Spontanous Breathing and Patient connected to face mask oxygen  Post-op Assessment: Report given to RN and Post -op Vital signs reviewed and stable  Post vital signs: Reviewed  Last Vitals:  Vitals Value Taken Time  BP 130/68 08/27/19 1346  Temp    Pulse 112 08/27/19 1351  Resp 16 08/27/19 1351  SpO2 99 % 08/27/19 1351  Vitals shown include unvalidated device data.  Last Pain:  Vitals:   08/27/19 0643  TempSrc: Temporal  PainSc:       Patients Stated Pain Goal: 4 (08/27/19 4268)  Complications: No complications documented.

## 2019-08-27 NOTE — Anesthesia Procedure Notes (Signed)
Procedure Name: Intubation Date/Time: 08/27/2019 7:41 AM Performed by: Lovie Chol, CRNA Pre-anesthesia Checklist: Patient identified, Emergency Drugs available, Suction available and Patient being monitored Patient Re-evaluated:Patient Re-evaluated prior to induction Oxygen Delivery Method: Circle System Utilized Preoxygenation: Pre-oxygenation with 100% oxygen Induction Type: IV induction Ventilation: Mask ventilation without difficulty Laryngoscope Size: Miller and 3 Grade View: Grade II Tube type: Oral Tube size: 8.0 mm Number of attempts: 1 Airway Equipment and Method: Stylet and Oral airway Placement Confirmation: ETT inserted through vocal cords under direct vision,  positive ETCO2 and breath sounds checked- equal and bilateral Secured at: 23 cm Tube secured with: Tape Dental Injury: Teeth and Oropharynx as per pre-operative assessment

## 2019-08-27 NOTE — H&P (Signed)
PREOPERATIVE H&P  Chief Complaint: Bilateral leg pain  HPI: Kenneth Mcbride is a 34 y.o. male who presents with ongoing pain in the bilateral legs  MRI reveals spinal stenosis L2-L5  Patient has failed multiple forms of conservative care and continues to have pain (see office notes for additional details regarding the patient's full course of treatment)  Past Medical History:  Diagnosis Date  . Diabetes mellitus without complication (HCC)   . Family history of adverse reaction to anesthesia    father and grand father both heart stopped during surgery  . No pertinent past medical history   . Spinal stenosis    Past Surgical History:  Procedure Laterality Date  . NO PAST SURGERIES     Social History   Socioeconomic History  . Marital status: Single    Spouse name: Not on file  . Number of children: Not on file  . Years of education: Not on file  . Highest education level: Not on file  Occupational History  . Not on file  Tobacco Use  . Smoking status: Never Smoker  . Smokeless tobacco: Never Used  Vaping Use  . Vaping Use: Never used  Substance and Sexual Activity  . Alcohol use: Yes    Comment: ocassionally  . Drug use: Never  . Sexual activity: Not Currently    Partners: Female  Other Topics Concern  . Not on file  Social History Narrative  . Not on file   Social Determinants of Health   Financial Resource Strain:   . Difficulty of Paying Living Expenses:   Food Insecurity:   . Worried About Programme researcher, broadcasting/film/video in the Last Year:   . Barista in the Last Year:   Transportation Needs:   . Freight forwarder (Medical):   [name] Lack of Transportation (Non-Medical):   Physical Activity:   . Days of Exercise per Week:   . Minutes of Exercise per Session:   Stress:   . Feeling of Stress :   Social Connections:   . Frequency of Communication with Friends and Family:   . Frequency of Social Gatherings with Friends and Family:   . Attends  Religious Services:   . Active Member of Clubs or Organizations:   . Attends Banker Meetings:   [name] Marital Status:    Family History  Problem Relation Age of Onset  . Diabetes Father   . Heart disease Father   . Diabetes Paternal Grandfather   . COPD Paternal Grandfather    No Known Allergies Prior to Admission medications   Medication Sig Start Date End Date Taking? Authorizing Provider  acetaminophen (TYLENOL) 500 MG tablet Take 1,000 mg by mouth every 6 (six) hours as needed for moderate pain.   Yes [provider]  cyclobenzaprine (FLEXERIL) 10 MG tablet One half to one tab PO qHS, then increase gradually to one tab TID. Patient taking differently: Take 10 mg by mouth at bedtime.  05/20/19  Yes Monica Becton, MD  gabapentin (NEURONTIN) 600 MG tablet Take 1 tablet (600 mg total) by mouth 3 (three) times daily. 07/01/19  Yes Monica Becton, MD  GLUCOSAMINE-CHONDROITIN PO Take 1 tablet by mouth daily.   Yes [provider]  Magnesium 250 MG TABS Take 250 mg by mouth daily.   Yes [provider]  metFORMIN (GLUCOPHAGE) 500 MG tablet Take 1 tablet (500 mg total) by mouth 2 (two) times daily with a meal. 07/22/19  Yes Christen Butter, NP  Multiple Vitamin (MULTIVITAMIN WITH MINERALS) TABS tablet Take 1 tablet by mouth daily.   Yes [provider]  Potassium 99 MG TABS Take 99 mg by mouth in the morning and at bedtime.   Yes [provider]     All other systems have been reviewed and were otherwise negative with the exception of those mentioned in the HPI and as above.  Physical Exam: Vitals:   08/27/19 0643  BP: 131/72  Pulse: 87  Resp: 17  Temp: (!) 97.3 F (36.3 C)  SpO2: 99%    Body mass index is 37.76 kg/m.  General: Alert, no acute distress Cardiovascular: No pedal edema Respiratory: No cyanosis, no use of accessory musculature Skin: No lesions in the area of chief complaint Neurologic: Sensation  intact distally Psychiatric: Patient is competent for consent with normal mood and affect Lymphatic: No axillary or cervical lymphadenopathy   Assessment/Plan: SPINAL STENOSIS L2-L5 Plan for Procedure(s): LUMBAR 2-LUMBAR 5 DECOMPRESSION   Jackelyn Hoehn, MD 08/27/2019 7:18 AM

## 2019-08-27 NOTE — Op Note (Signed)
PATIENT NAME: Kenneth Mcbride   MEDICAL RECORD NO.:   [id number]    DATE OF BIRTH: [birth year]   DATE OF PROCEDURE: 08/27/2019        OPERATIVE REPORT   PREOPERATIVE DIAGNOSES: 1. Bilateral leg pain. 2. Neurogenic claudication. 3. Spinal stenosis, L2/3, L3/4, L4/5. 4. Large L3-4 disc herniation  POSTOPERATIVE DIAGNOSES: 1. Bilateral leg pain. 2. Neurogenic claudication. 3. Spinal stenosis, L2/3, L3/4, L4/5. 4. Large L3-4 disc herniation  PROCEDURE:    1. Complex L2/3, L3/4, L4/5 laminectomy with bilateral partial facetectomy and bilateral foraminotomy, with removal of large central L3/4 disc hernation 2. Durotomy repair x 2  SURGEON:  Estill Bamberg, MD.  ASSISTANTJason Coop, PA-C.  ANESTHESIA:  General endotracheal anesthesia.  COMPLICATIONS:  None.  DISPOSITION:  Stable.  ESTIMATED BLOOD LOSS:  Minimal.  INDICATIONS FOR SURGERY:  Briefly, Kenneth Mcbride is a very pleasant 34 y.o.  year-old male, who did present to me with pain in the bilateral legs. The patient's MRI did reveal severe spinal stenosis at L2/3, L3/4 and L4/5, in addition to a prominent central L3-4 disc herniation.  I did feel these findings were very likely to be correlating to his symptoms, his bilateral leg pain and weakness. We did proceed with appropriate conservative treatment, but the patient did continue to have ongoing pain and dysfunction. Given the patient's ongoing pain and dysfunction, we did discuss proceeding with the procedure reflected above.  The patient was fully aware of the risks and limitations of surgery and did wish to proceed.  OPERATIVE DETAILS:  On 08/27/2019, the patient was brought to surgery and general endotracheal anesthesia was administered.  The patient was placed prone on a well-padded flat Jackson bed with a spinal frame. Antibiotics were given and the back was prepped and draped in the usual sterile fashion.  A time-out procedure was performed.  I then made  a midline incision overlying the L2/3 L3/4 and L4/5 intervertebral spaces.  The fascia was incised at the midline.  The paraspinal musculature was bluntly retracted laterally and held retracted with a self-retaining retractor.  Getting to this point was rather meticulous and rather time-consuming, given the patient's obesity and large body habitus. After confirming the appropriate operative level, I did remove the spinous process of L2, L3 and L4.  At this point, I proceeded with a partial facetectomy on the right and left sides.  Of note, there was  significant overgrowth of the facet joints bilaterally, and there was also substantial hypertrophy of the ligamentum flavum.  The lateral recess stenosis was addressed by using Kerrison punches to thoroughly and decompress the right and left lateral recess.  At this point, I carried the decompression up to the L3-4 level. Once again, a partial facetectomy was performed bilaterally.  Of note, I did evaluate the spinal canal after performing a bilateral partial facetectomy, and it was readily apparent that there continued to be substantial pressure of the thecal sac.  On the patient's right side, I did gain medial mobilization of the traversing right L4 nerve.  Immediately ventral to the nerve, was a readily obvious cystlike structure.  With an assistant safety holding medial retraction of the traversing right L4 nerve, I did incise the structure, and there was abundant disc material noted.  This was removed using a series of straight and upward biting micropituitaries.  I then evaluated the spinal canal at the L3-4 intervertebral space, and was very pleased with the decompression at this point. I then carried the decompression  up to the L2/3 level.  Once again, a partial facetectomy was performed bilaterally, and I was able to thoroughly and completely decompress the right and left lateral recess at L2/3.  Of note, the decompression at each level did take  substantially more time than usual, given the severity of the stenosis, and the patient's body habitus.  The procedure did take approximately 5 hours in total, whereas normally, surgery such as this would take approximately 3 hours. Of note, upon completion of the decompression, 2 small durotomies were encountered at the central aspect of the spinal canal, and at the region of the left lateral recess, both at approximately the L3 level.  A very small amount of cerebrospinal fluid was noted to be extravasating from these areas.  Using 6-0 Prolene, I did uneventfully repair each durotomy with a simple stitch.  I then reinforced the repair using DuraSeal. The spinal canal was entirely decompressed.  Upon additional exploration of the surgical bed, there was generalized oozing noted.  I therefore did elect to place a #15 deep Blake drain.  This was secured into place using 2-0 nylon. Prior to this, the wound was copiously irrigated with a total of approximately 2 L of normal saline. Gelfoam was placed over the laminectomy site.  I was very pleased with the decompression.  There was no extravasation of cerebrospinal fluid n at the completion of the surgery ry.  At this point, the wound was closed in layers using #1 Vicryl, followed by 2-0 Vicryl, followed by 4- 0 Monocryl.  Benzoin and Steri-Strips were applied, followed by a sterile dressing.  All instrument counts were correct at the termination of the procedure.  Of note, Jason Coop, PA-C, was my assistant throughout surgery, and did aid in retraction, suctioning, and closure from start to finish.   Estill Bamberg, MD

## 2019-08-28 ENCOUNTER — Encounter (HOSPITAL_COMMUNITY): Payer: Self-pay | Admitting: Orthopedic Surgery

## 2019-08-28 DIAGNOSIS — M48061 Spinal stenosis, lumbar region without neurogenic claudication: Secondary | ICD-10-CM | POA: Diagnosis not present

## 2019-08-28 LAB — GLUCOSE, CAPILLARY
Glucose-Capillary: 119 mg/dL — ABNORMAL HIGH (ref 70–99)
Glucose-Capillary: 128 mg/dL — ABNORMAL HIGH (ref 70–99)
Glucose-Capillary: 150 mg/dL — ABNORMAL HIGH (ref 70–99)

## 2019-08-28 NOTE — Progress Notes (Signed)
[name] lumbar drain continues to have high output. Between 7am and 9am, drainage was 35cc/hour, which is increased from the 24cc/hour previously reported. This may be from ambulating, but I do have some concern over a persistent CSF leak, although he denies having any positional headaches. I did lie him flat and then sat him to edge of bed for 15 minutes, and he denied any positional headache after sitting upright for 15 minutes. He will walk again today at least 3 times, and I will follow his symptoms and his drainage. If his drainage persists or he begins to get positional headaches, he may need to be brought back to surgery for a wound exploration.

## 2019-08-28 NOTE — Progress Notes (Signed)
    Patient doing well Denies leg pain, denies headaches other than a generalized nonpositional ache Has been sitting at edge of bed for 30 minutes   Physical Exam: Vitals:   08/28/19 0357 08/28/19 0500  BP: 126/61   Pulse: (!) 106   Resp: (!) 21 18  Temp: 98.2 F (36.8 C)   SpO2: 98%     Dressing in place NVI  Drain output is serosanguinous: 120cc/5 hours, and then 70cc/3 hours (rate unchanged at about 24cc/ hour)  POD #1 s/p L2-L5 decompression doing well  - I did uncharge drain for now to see if output may decrease later on - Percocet for pain, Robaxin for muscle spasms - will attempt ambulation at 8:15 this AM for 10 minutes and monitor for headaches - will plan to recharge drain later this AM

## 2019-08-28 NOTE — Anesthesia Postprocedure Evaluation (Signed)
Anesthesia Post Note  Patient: Kenneth Mcbride  Procedure(s) Performed: LUMBAR 2-LUMBAR 5 DECOMPRESSION (N/A Spine Lumbar)     Patient location during evaluation: PACU Anesthesia Type: General Level of consciousness: awake and alert Pain management: pain level controlled Vital Signs Assessment: post-procedure vital signs reviewed and stable Respiratory status: spontaneous breathing, nonlabored ventilation, respiratory function stable and patient connected to nasal cannula oxygen Cardiovascular status: blood pressure returned to baseline and stable Postop Assessment: no apparent nausea or vomiting Anesthetic complications: no   No complications documented.  Last Vitals:  Vitals:   08/28/19 0717 08/28/19 1201  BP: 110/65 116/68  Pulse: (!) 105 97  Resp: 17 16  Temp: 37 C 37.2 C  SpO2: 97% 97%    Last Pain:  Vitals:   08/28/19 1201  TempSrc: Oral  PainSc:                  Kenneth Mcbride Kenneth Mcbride

## 2019-08-29 DIAGNOSIS — M48061 Spinal stenosis, lumbar region without neurogenic claudication: Secondary | ICD-10-CM | POA: Diagnosis not present

## 2019-08-29 LAB — GLUCOSE, CAPILLARY: Glucose-Capillary: 121 mg/dL — ABNORMAL HIGH (ref 70–99)

## 2019-08-29 MED ORDER — OXYCODONE-ACETAMINOPHEN 5-325 MG PO TABS: 1.0000 | ORAL_TABLET | ORAL | 0 refills | Status: DC | PRN

## 2019-08-29 MED ORDER — METHOCARBAMOL 500 MG PO TABS: 500.0000 mg | ORAL_TABLET | Freq: Four times a day (QID) | ORAL | 1 refills | Status: DC | PRN

## 2019-08-29 NOTE — Evaluation (Signed)
Occupational Therapy Evaluation and Discharge Patient Details Name: Kenneth Mcbride MRN: [id number] DOB: [birth year] Today's Date: 08/29/2019    History of Present Illness This 34 yo male with complex L2/3, L3/4, L4/5 laminectomy with bilateral partial facetectomy andbilateral foraminotomy, with removal of large central L3/4 disc hernation. Durotomy repair x 2   Clinical Impression   This 34 yo male admitted and underwent above presents to acute OT with all education completed, we will D/C from acute OT.    Follow Up Recommendations  No OT follow up;Supervision/Assistance - 24 hour (for a couple days, then intermittent S)    Equipment Recommendations  3 in 1 bedside commode       Precautions / Restrictions Precautions Precautions: Fall;Back Precaution Booklet Issued: Yes (comment) Required Braces or Orthoses: Spinal Brace Spinal Brace: Lumbar corset;Applied in supine position Restrictions Weight Bearing Restrictions: No      Mobility Bed Mobility Overal bed mobility: Needs Assistance Bed Mobility: Rolling;Sidelying to Sit Rolling: Min assist Sidelying to sit: Min assist          Transfers Overall transfer level: Needs assistance Equipment used: Rolling walker (2 wheeled) Transfers: Sit to/from Stand Sit to Stand: Min guard         General transfer comment: With increased time and effort he was able to stand from bed. Up first time he had both hands on RW, could not get up without doing it this way; however on 2nd time he was able to put one hand on RW and one on bed--he reported the first time is always harder.    Balance Overall balance assessment: Needs assistance Sitting-balance support: No upper extremity supported;Feet supported Sitting balance-Leahy Scale: Fair Sitting balance - Comments: spasms are the issue   Standing balance support: Bilateral upper extremity supported Standing balance-Leahy Scale: Poor Standing balance comment: reliant on RW due to  spasms                           ADL either performed or assessed with clinical judgement   ADL Overall ADL's : Needs assistance/impaired Eating/Feeding: Independent;Sitting   Grooming: Set up;Standing   Upper Body Bathing: Set up;Sitting   Lower Body Bathing: Moderate assistance Lower Body Bathing Details (indicate cue type and reason): min A sit<>stand (increased time and effort due to spasms) Upper Body Dressing : Set up;Sitting   Lower Body Dressing: Maximal assistance Lower Body Dressing Details (indicate cue type and reason): min A sit<>stand (increased time and effort due to spasms) Toilet Transfer: Minimal assistance;Ambulation;BSC;RW             General ADL Comments: Educated on the squencing of dressing for efficiency, use of wet wipes for back peri care (eaiser with brace off), use of two cups for brushing teeth to avoid bending of sink (sitting to do any grooming tasks is safer now due to spasms), use of RW over toilet if just urinating, using 3n1 for bowel movements but may also have to use urinal depending on how he fits on 3n1, side step into shower     Vision Patient Visual Report: No change from baseline              Pertinent Vitals/Pain Pain Assessment: Faces Faces Pain Scale: Hurts even more Pain Location: upper glutes Pain Descriptors / Indicators: Spasm Pain Intervention(s): Limited activity within patient's tolerance;Monitored during session;Patient requesting pain meds-RN notified;RN gave pain meds during session     Hand Dominance Left   Extremity/Trunk  Assessment Upper Extremity Assessment Upper Extremity Assessment: Overall WFL for tasks assessed           Communication Communication Communication: No difficulties   Cognition Arousal/Alertness: Awake/alert Behavior During Therapy: WFL for tasks assessed/performed Overall Cognitive Status: Within Functional Limits for tasks assessed                                                 Home Living Family/patient expects to be discharged to:: Private residence Living Arrangements: Spouse/significant other Available Help at Discharge: Family;Available 24 hours/day (girlfriend works days, comes home at lunch) Type of Home: House Home Access: Stairs to enter [employer] of Steps: 2 Entrance Stairs-Rails: None Home Layout: One level     Bathroom Shower/Tub: Tub/shower unit;Curtain   Firefighter: Standard     Home Equipment: Cane - single point;Shower seat          Prior Functioning/Environment Level of Independence: Independent                 OT Problem List: Decreased strength;Impaired balance (sitting and/or standing);Decreased range of motion;Pain         OT Goals(Current goals can be found in the care plan section) Acute Rehab OT Goals Patient Stated Goal: to go home today, for spasms to get better  OT Frequency:                AM-PAC OT "6 Clicks" Daily Activity     Outcome Measure Help from another person eating meals?: None Help from another person taking care of personal grooming?: A Little Help from another person toileting, which includes using toliet, bedpan, or urinal?: A Little Help from another person bathing (including washing, rinsing, drying)?: A Lot Help from another person to put on and taking off regular upper body clothing?: A Little Help from another person to put on and taking off regular lower body clothing?: A Lot 6 Click Score: 17   End of Session Equipment Utilized During Treatment: Gait belt;Rolling walker;Back brace Nurse Communication:  (needs 3n1 and a PT eval)  Activity Tolerance: Patient tolerated treatment well Patient left: in chair;with call bell/phone within reach  OT Visit Diagnosis: Unsteadiness on feet (R26.81);Other abnormalities of gait and mobility (R26.89);Muscle weakness (generalized) (M62.81);Pain Pain - part of body:  (upper glutes)                 Time: 8003-4917 OT Time Calculation (min): 45 min Charges:  OT General Charges $OT Visit: 1 Visit OT Evaluation $OT Eval Moderate Complexity: 1 Mod OT Treatments $Self Care/Home Management : 23-37 mins  Kenneth Mcbride, OTR/L Acute [employer] Pager [phone] Office [phone]     Kenneth Mcbride 08/29/2019, 9:48 AM

## 2019-08-29 NOTE — Plan of Care (Signed)
Patient alert and oriented, mae's well, voiding adequate amount of urine, swallowing without difficulty, no c/o pain at time of discharge. Patient discharged home with family. Script and discharged instructions given to patient. Patient and family stated understanding of instructions given. Patient has an appointment with Dr. Dumonski 

## 2019-08-29 NOTE — Progress Notes (Signed)
° ° °  Patient doing well Has been ambulating without headaches and drainage No leg pain Has been urinating normally   Physical Exam: Vitals:   08/29/19 0322 08/29/19 0721  BP: 125/64 134/83  Pulse: 97 (!) 103  Resp: 18 17  Temp: 99.4 F (37.4 C) 99 F (37.2 C)  SpO2: 97% 96%    Dressing in place - dry and intact without drainage NVI  POD #2 s/p lumbar decompression and dural repair, doing well  - encourage ambulation - Percocet for pain, Robaxin for muscle spasms - likely d/c home today with f/u in 2 weeks as long as patient does not develop headaches or drainage - will check back with him this afternoon and d/c home if he continues to do well

## 2019-08-29 NOTE — Evaluation (Signed)
Physical Therapy Evaluation Patient Details Name: Kenneth Mcbride MRN: [id number] DOB: 28-Apr-1985 Today's Date: 08/29/2019   History of Present Illness  Pt is a 34 yo male with complex L2/3, L3/4, L4/5 laminectomy with bilateral partial facetectomy andbilateral foraminotomy, with removal of large central L3/4 disc hernation. Durotomy repair x 2. PMH including but not limited to DM.    Clinical Impression  Pt presented sitting upright in recliner chair, awake and willing to participate in therapy session. Prior to admission, pt reported that he has been having to ambulate with use of a cane secondary to balance deficits and spasms. He lives with his significant other in a single level home with a couple of steps to enter. Pt's girlfriend will be available to assist him intermittently upon d/c. At the time of evaluation, pt with some difficulty with transfers and ambulation secondary to intermittent bilateral glute spasms. He was only able to tolerate a short distance ambulating in hallway with use of RW. Pt demonstrating a very slow, shuffling gait and required a few standing rest breaks due to pain. Unable to tolerate stair training this session but verbally discussed and recommended pt have another person with him to assist him initially getting inside his home. Also discussed more home safety topics, such as doing most of his moving/walking while someone was present to avoid any falls but also to stand from his couch/chair once every hour and take some steps in place. Pt expressed understanding and agreeable to plan. Pt would continue to benefit from skilled physical therapy services at this time while admitted and after d/c to address the below listed limitations in order to improve overall safety and independence with functional mobility.     Follow Up Recommendations Outpatient PT;Other (comment) (once cleared by MD)    Equipment Recommendations  Rolling walker with 5" wheels;3in1 (PT)     Recommendations for Other Services       Precautions / Restrictions Precautions Precautions: Fall;Back Precaution Booklet Issued: Yes (comment) Precaution Comments: pt able to recall 3/3 back precautions Required Braces or Orthoses: Spinal Brace Spinal Brace: Lumbar corset;Applied in supine position Restrictions Weight Bearing Restrictions: No      Mobility  Bed Mobility Overal bed mobility: Needs Assistance Bed Mobility: Rolling;Sidelying to Sit Rolling: Min assist Sidelying to sit: Min assist       General bed mobility comments: pt OOB in recliner chair upon arrival  Transfers Overall transfer level: Needs assistance Equipment used: Rolling walker (2 wheeled) Transfers: Sit to/from Stand Sit to Stand: Min guard         General transfer comment: pt requiring greatly increased time and effort, multiple attempts needed due to glute spasms; cueing for safe hand placement and technique  Ambulation/Gait Ambulation/Gait assistance: Min guard Gait Distance (Feet): 75 Feet Assistive device: Rolling walker (2 wheeled) Gait Pattern/deviations: Step-through pattern;Decreased step length - right;Decreased step length - left;Decreased stride length;Shuffle;Trunk flexed Gait velocity: decreased   General Gait Details: pt with very slow, shuffling gait pattern; multiple bouts of instability with glute spasms but pt able to support himself with bilateral UEs and RW; however, greatly limited his distance and tolerance  Stairs            Wheelchair Mobility    Modified Rankin (Stroke Patients Only)       Balance Overall balance assessment: Needs assistance Sitting-balance support: No upper extremity supported;Feet supported Sitting balance-Leahy Scale: Fair Sitting balance - Comments: spasms are the issue   Standing balance support: Bilateral upper extremity supported  Standing balance-Leahy Scale: Poor Standing balance comment: reliant on RW due to spasms                              Pertinent Vitals/Pain Pain Assessment: Faces Faces Pain Scale: Hurts little more Pain Location: upper glutes Pain Descriptors / Indicators: Spasm Pain Intervention(s): Monitored during session;Repositioned    Home Living Family/patient expects to be discharged to:: Private residence Living Arrangements: Spouse/significant other Available Help at Discharge: Family;Available 24 hours/day (girlfriend works days, comes home at lunch) Type of Home: House Home Access: Stairs to enter Entrance Stairs-Rails: None Secretary/administrator of Steps: 2 Home Layout: One level Home Equipment: Cane - single point      Prior Function Level of Independence: Independent with assistive device(s)         Comments: ambulating with cane     Hand Dominance   Dominant Hand: Left    Extremity/Trunk Assessment   Upper Extremity Assessment Upper Extremity Assessment: Defer to OT evaluation;Overall [facility] for tasks assessed    Lower Extremity Assessment Lower Extremity Assessment: Generalized weakness    Cervical / Trunk Assessment Cervical / Trunk Assessment: Other exceptions Cervical / Trunk Exceptions: s/p lumbar sx  Communication   Communication: No difficulties  Cognition Arousal/Alertness: Awake/alert Behavior During Therapy: WFL for tasks assessed/performed Overall Cognitive Status: Within Functional Limits for tasks assessed                                        General Comments      Exercises     Assessment/Plan    PT Assessment Patient needs continued PT services  PT Problem List Pain;Decreased strength;Decreased activity tolerance;Decreased balance;Decreased mobility;Decreased coordination;Decreased knowledge of use of DME;Decreased safety awareness;Decreased knowledge of precautions       PT Treatment Interventions DME instruction;Gait training;Stair training;Functional mobility training;Therapeutic  activities;Therapeutic exercise;Balance training;Neuromuscular re-education;Patient/family education    PT Goals (Current goals can be found in the Care Plan section)  Acute Rehab PT Goals Patient Stated Goal: to go home today, for spasms to get better PT Goal Formulation: With patient Time For Goal Achievement: 09/12/19 Potential to Achieve Goals: Good    Frequency Min 5X/week   Barriers to discharge        Co-evaluation               AM-PAC PT "6 Clicks" Mobility  Outcome Measure Help needed turning from your back to your side while in a flat bed without using bedrails?: None Help needed moving from lying on your back to sitting on the side of a flat bed without using bedrails?: None Help needed moving to and from a bed to a chair (including a wheelchair)?: A Little Help needed standing up from a chair using your arms (e.g., wheelchair or bedside chair)?: None Help needed to walk in hospital room?: A Little Help needed climbing 3-5 steps with a railing? : A Lot 6 Click Score: 20    End of Session Equipment Utilized During Treatment: Gait belt;Back brace Activity Tolerance: Patient limited by pain Patient left: in bed;with call bell/phone within reach;Other (comment) (sitting EOB) Nurse Communication: Mobility status PT Visit Diagnosis: Other abnormalities of gait and mobility (R26.89);Pain Pain - Right/Left:  (bilateral) Pain - part of body:  (glutes)    Time: 1700-1749 PT Time Calculation (min) (ACUTE ONLY): 27 min   Charges:  PT Evaluation $PT Eval Moderate Complexity: 1 Mod PT Treatments $Gait Training: 8-22 mins        Arletta Bale, DPT  Acute Rehabilitation Services Pager [phone] Office [phone]    Alessandra Bevels Briceida Rasberry 08/29/2019, 11:10 AM

## 2019-09-02 MED FILL — Heparin Sodium (Porcine) Inj 1000 Unit/ML: INTRAMUSCULAR | Qty: 30 | Status: AC

## 2019-09-02 MED FILL — Sodium Chloride IV Soln 0.9%: INTRAVENOUS | Qty: 1000 | Status: AC

## 2019-09-03 NOTE — Discharge Summary (Signed)
Patient ID: Kenneth Mcbride MRN: [id number] DOB/AGE: 1986-01-30 34 y.o.  Admit date: 08/27/2019 Discharge date: 08/29/2019  Admission Diagnoses:  Active Problems:   Spinal stenosis   Discharge Diagnoses:  Same  Past Medical History:  Diagnosis Date   Diabetes mellitus without complication (HCC)    Family history of adverse reaction to anesthesia    father and grand father both heart stopped during surgery   No pertinent past medical history    Spinal stenosis     Surgeries: Procedure(s): LUMBAR 2-LUMBAR 5 DECOMPRESSION on 08/27/2019   Consultants: None  Discharged Condition: Improved  Hospital Course: Kenneth Mcbride is an 34 y.o. male who was admitted 08/27/2019 for operative treatment of spinal stenosis. Patient has severe unremitting pain that affects sleep, daily activities, and work/hobbies. After pre-op clearance the patient was taken to the operating room on 08/27/2019 and underwent  Procedure(s): LUMBAR 2-LUMBAR 5 DECOMPRESSION.    Patient was given perioperative antibiotics:  Anti-infectives (From admission, onward)   Start     Dose/Rate Route Frequency Ordered Stop   08/27/19 1600  ceFAZolin (ANCEF) IVPB 2g/100 mL premix        2 g 200 mL/hr over 30 Minutes Intravenous Every 8 hours 08/27/19 1451 08/27/19 2336   08/27/19 0600  ceFAZolin (ANCEF) 3 g in dextrose 5 % 50 mL IVPB        3 g 100 mL/hr over 30 Minutes Intravenous On call to O.R. 08/26/19 0755 08/27/19 1215       Patient was given sequential compression devices, early ambulation to prevent DVT.  Patient benefited maximally from hospital stay and there were no complications.    Recent vital signs: BP 116/78 (BP Location: Right Arm)    Pulse (!) 101    Temp 98.7 F (37.1 C) (Oral)    Resp 16    Ht 6' (1.829 m)    Wt 126.3 kg    SpO2 99%    BMI 37.76 kg/m    Discharge Medications:   Allergies as of 08/29/2019   No Known Allergies     Medication List    TAKE these medications   acetaminophen  500 MG tablet Commonly known as: TYLENOL Take 1,000 mg by mouth every 6 (six) hours as needed for moderate pain.   cyclobenzaprine 10 MG tablet Commonly known as: FLEXERIL One half to one tab PO qHS, then increase gradually to one tab TID. What changed:   how much to take  how to take this  when to take this  additional instructions   gabapentin 600 MG tablet Commonly known as: NEURONTIN Take 1 tablet (600 mg total) by mouth 3 (three) times daily.   GLUCOSAMINE-CHONDROITIN PO Take 1 tablet by mouth daily.   Magnesium 250 MG Tabs Take 250 mg by mouth daily.   metFORMIN 500 MG tablet Commonly known as: GLUCOPHAGE Take 1 tablet (500 mg total) by mouth 2 (two) times daily with a meal.   methocarbamol 500 MG tablet Commonly known as: ROBAXIN Take 1 tablet (500 mg total) by mouth every 6 (six) hours as needed for muscle spasms.   multivitamin with minerals Tabs tablet Take 1 tablet by mouth daily.   oxyCODONE-acetaminophen 5-325 MG tablet Commonly known as: PERCOCET/ROXICET Take 1-2 tablets by mouth every 4 (four) hours as needed for moderate pain or severe pain.   Potassium 99 MG Tabs Take 99 mg by mouth in the morning and at bedtime.       Diagnostic Studies: DG Lumbar Spine  2-3 Views  Result Date: 08/27/2019 CLINICAL DATA:  Lumbar decompression. EXAM: LUMBAR SPINE - 2-3 VIEW COMPARISON:  MRI of May 31, 2019. FINDINGS: Two intraoperative cross-table lateral projections were obtained of the lumbar spine. The first image demonstrates surgical probes directed toward the posterior spinous process of L4 as well as the posterior portions of S1. The second image demonstrates surgical probes projected over the posterior elements of L3 and L4. IMPRESSION: Surgical localization as described above. Electronically Signed   By: Lupita Raider M.D.   On: 08/27/2019 16:24    Disposition: Discharge disposition: 06-Home-Health Care Svc      POD #2 s/p lumbar decompression and  dural repair, doing well  - encourage ambulation - Percocet for pain, Robaxin for muscle spasms -Scripts for pain sent to pharmacy electronically  -D/C instructions sheet printed and in chart -D/C today  -F/U in office 2 weeks   Signed: Eilene Ghazi Bernece Gall 09/03/2019, 1:10 PM

## 2019-09-17 NOTE — Telephone Encounter (Signed)
Happy to see this guy, schedule him please.

## 2019-09-18 NOTE — Telephone Encounter (Signed)
Appointment scheduled with T for Monday- July 26th, 2021.

## 2019-09-22 ENCOUNTER — Ambulatory Visit (INDEPENDENT_AMBULATORY_CARE_PROVIDER_SITE_OTHER): Payer: 59

## 2019-09-22 ENCOUNTER — Other Ambulatory Visit: Payer: Self-pay

## 2019-09-22 ENCOUNTER — Ambulatory Visit (INDEPENDENT_AMBULATORY_CARE_PROVIDER_SITE_OTHER): Payer: 59 | Admitting: Sports Medicine

## 2019-09-22 DIAGNOSIS — M898X9 Other specified disorders of bone, unspecified site: Secondary | ICD-10-CM

## 2019-09-22 DIAGNOSIS — S6992XA Unspecified injury of left wrist, hand and finger(s), initial encounter: Secondary | ICD-10-CM | POA: Diagnosis not present

## 2019-09-22 NOTE — Assessment & Plan Note (Addendum)
Kenneth Mcbride has had difficulty with bowling, he has a knot on the volar aspect of his left fourth DIP. On exam there is a palpable bony knot, this unfortunately interferes with flexion at the DIP, really has no pain. X-rays were personally reviewed and show an exostosis at the insertion of the flexor digitorum profundus, and this likely causes bony impingement of his motion. There is not an easy solution to this short of excision and likely reattachment of his flexor digitorum profundus tendon. I have given him some home rehab, he can use bowling balls with a larger hole, and return as needed for this. If he desires referral to hand surgery he will let me know.

## 2019-09-22 NOTE — Progress Notes (Signed)
    Procedures performed today:    None.  Independent interpretation of notes and tests performed by another provider:   X-rays were personally reviewed and show an exostosis at the insertion of the flexor digitorum profundus, and this likely causes bony impingement of his motion.  Brief History, Exam, Impression, and Recommendations:    Left fourth distal phalangeal exostosis Kenneth Mcbride has had difficulty with bowling, he has a knot on the volar aspect of his left fourth DIP. On exam there is a palpable bony knot, this unfortunately interferes with flexion at the DIP, really has no pain. X-rays were personally reviewed and show an exostosis at the insertion of the flexor digitorum profundus, and this likely causes bony impingement of his motion. There is not an easy solution to this short of excision and likely reattachment of his flexor digitorum profundus tendon. I have given him some home rehab, he can use bowling balls with a larger hole, and return as needed for this. If he desires referral to hand surgery he will let me know.    ___________________________________________ Kenneth Mcbride. Benjamin Stain, M.D., ABFM., CAQSM. Primary Care and Sports Medicine [facility]  Adjunct Instructor of Family Medicine  [employer]

## 2019-10-13 ENCOUNTER — Encounter: Payer: Self-pay | Admitting: Physician Assistant

## 2019-10-13 ENCOUNTER — Ambulatory Visit (INDEPENDENT_AMBULATORY_CARE_PROVIDER_SITE_OTHER): Payer: 59 | Admitting: Physician Assistant

## 2019-10-13 ENCOUNTER — Other Ambulatory Visit: Payer: Self-pay

## 2019-10-13 VITALS — BP 136/84 | HR 90 | Ht 72.0 in | Wt 282.0 lb

## 2019-10-13 DIAGNOSIS — Z79899 Other long term (current) drug therapy: Secondary | ICD-10-CM | POA: Diagnosis not present

## 2019-10-13 DIAGNOSIS — Z131 Encounter for screening for diabetes mellitus: Secondary | ICD-10-CM | POA: Diagnosis not present

## 2019-10-13 DIAGNOSIS — J0100 Acute maxillary sinusitis, unspecified: Secondary | ICD-10-CM

## 2019-10-13 DIAGNOSIS — E1169 Type 2 diabetes mellitus with other specified complication: Secondary | ICD-10-CM | POA: Diagnosis not present

## 2019-10-13 DIAGNOSIS — Z13220 Encounter for screening for lipoid disorders: Secondary | ICD-10-CM

## 2019-10-13 MED ORDER — METFORMIN HCL 500 MG PO TABS: 500.0000 mg | ORAL_TABLET | Freq: Two times a day (BID) | ORAL | 2 refills | Status: DC

## 2019-10-13 MED ORDER — AMBULATORY NON FORMULARY MEDICATION: 0 refills | Status: DC

## 2019-10-13 MED ORDER — AZITHROMYCIN 250 MG PO TABS: ORAL_TABLET | ORAL | 0 refills | Status: DC

## 2019-10-13 NOTE — Patient Instructions (Addendum)
Need eye exam.  Continue on metformin.  Start flonase.

## 2019-10-13 NOTE — Progress Notes (Signed)
Subjective:    Patient ID: Kenneth Mcbride, male    DOB: 07-18-1985, 34 y.o.   MRN: [id number]  HPI  Pt is a 34 yo obese male with T2DM who presents to the clinic for medication refills.   He is checking his sugars in mornings but ran out of strips. Ranging under 120 most mornings. No hypoglycemic events. No problems with metformin. No open sores or wounds. Last a1c was 7.3.   He did have lumbar decompression surgery 6/30 he is doing fantastic. No pain medications. On gabapentin.   He has had some sinus pressure, drainage, cough for last week or so. Taking dayquil and nyquil with some relief but won't go away. No fever, chills, loss of smell or taste, GI symptoms, SOB. He does have a productive cough.   .. Active Ambulatory Problems    Diagnosis Date Noted  . Closed fracture of proximal phalanx of third finger of left hand 11/28/2013  . Tendinitis of left hand 01/16/2014  . Lumbar spinal stenosis 05/20/2019  . Class 2 severe obesity due to excess calories with serious comorbidity and body mass index (BMI) of 38.0 to 38.9 in adult (HCC) 08/12/2019  . Diabetes mellitus type II, controlled (HCC) 08/12/2019  . Spinal stenosis 08/27/2019  . Injury of left ring finger 09/22/2019  . Left fourth distal phalangeal exostosis 09/22/2019   Resolved Ambulatory Problems    Diagnosis Date Noted  . No Resolved Ambulatory Problems   Past Medical History:  Diagnosis Date  . Diabetes mellitus without complication (HCC)   . Family history of adverse reaction to anesthesia   . No pertinent past medical history     Review of Systems  All other systems reviewed and are negative.      Objective:   Physical Exam Vitals reviewed.  Constitutional:      Appearance: Normal appearance.  HENT:     Head: Normocephalic.     Right Ear: Tympanic membrane normal.     Left Ear: Tympanic membrane normal.     Nose: Congestion present.     Mouth/Throat:     Mouth: Mucous membranes are moist.      Pharynx: No oropharyngeal exudate or posterior oropharyngeal erythema.  Eyes:     Conjunctiva/sclera: Conjunctivae normal.     Pupils: Pupils are equal, round, and reactive to light.  Cardiovascular:     Rate and Rhythm: Normal rate and regular rhythm.     Pulses: Normal pulses.  Pulmonary:     Effort: Pulmonary effort is normal.     Breath sounds: Normal breath sounds.  Neurological:     General: No focal deficit present.     Mental Status: He is alert and oriented to person, place, and time.  Psychiatric:        Mood and Affect: Mood normal.           Assessment & Plan:  Marland KitchenMarland KitchenOdie was seen today for diabetes.  Diagnoses and all orders for this visit:  Controlled type 2 diabetes mellitus with other specified complication, without long-term current use of insulin (HCC) -     AMBULATORY NON FORMULARY MEDICATION; Glucometer lancets, strips to test blood sugar once a day for T2DM. -     Hemoglobin A1c -     metFORMIN (GLUCOPHAGE) 500 MG tablet; Take 1 tablet (500 mg total) by mouth 2 (two) times daily with a meal.  Screening for lipid disorders -     Lipid Panel w/reflex Direct LDL  Screening for diabetes  mellitus -     COMPLETE METABOLIC PANEL WITH GFR  Medication management -     Hemoglobin A1c -     Lipid Panel w/reflex Direct LDL -     COMPLETE METABOLIC PANEL WITH GFR  Acute non-recurrent maxillary sinusitis -     azithromycin (ZITHROMAX Z-PAK) 250 MG tablet; Take 2 tablets (500 mg) on  Day 1,  followed by 1 tablet (250 mg) once daily on Days 2 through 5.   Too soon for a1c. Printed for patient to get in 2 weeks.  Fasting labs ordered.  Continue on metformin.  Ordered new glucometer and strips for morning glucose checking.  Continue DM diet.  BP very close to goal. On decongestants as well.  On statin.  Needs eye exam.  UTD pneumonia vaccine.  Follow up in 3 months.   Treated for sinusitis with zpak and flonase.

## 2019-10-28 ENCOUNTER — Other Ambulatory Visit: Payer: Self-pay | Admitting: Physician Assistant

## 2019-10-28 ENCOUNTER — Encounter: Payer: Self-pay | Admitting: Physician Assistant

## 2019-10-28 DIAGNOSIS — E1169 Type 2 diabetes mellitus with other specified complication: Secondary | ICD-10-CM

## 2019-10-28 DIAGNOSIS — E785 Hyperlipidemia, unspecified: Secondary | ICD-10-CM

## 2019-10-28 LAB — COMPLETE METABOLIC PANEL WITH GFR
AG Ratio: 1.9 (calc) (ref 1.0–2.5)
ALT: 30 U/L (ref 9–46)
AST: 21 U/L (ref 10–40)
Albumin: 4.4 g/dL (ref 3.6–5.1)
Alkaline phosphatase (APISO): 86 U/L (ref 36–130)
BUN: 17 mg/dL (ref 7–25)
CO2: 28 mmol/L (ref 20–32)
Calcium: 9.1 mg/dL (ref 8.6–10.3)
Chloride: 105 mmol/L (ref 98–110)
Creat: 0.98 mg/dL (ref 0.60–1.35)
GFR, Est African American: 114 mL/min/{1.73_m2} (ref >=60)
GFR, Est Non African American: 99 mL/min/{1.73_m2} (ref >=60)
Globulin: 2.3 g/dL (calc) (ref 1.9–3.7)
Glucose, Bld: 129 mg/dL — ABNORMAL HIGH (ref 65–99)
Potassium: 4.1 mmol/L (ref 3.5–5.3)
Sodium: 139 mmol/L (ref 135–146)
Total Bilirubin: 0.6 mg/dL (ref 0.2–1.2)
Total Protein: 6.7 g/dL (ref 6.1–8.1)

## 2019-10-28 LAB — LIPID PANEL W/REFLEX DIRECT LDL
Cholesterol: 243 mg/dL — ABNORMAL HIGH (ref <200)
HDL: 33 mg/dL — ABNORMAL LOW (ref >=40)
LDL Cholesterol (Calc): 168 mg/dL (calc) — ABNORMAL HIGH
Non-HDL Cholesterol (Calc): 210 mg/dL (calc) — ABNORMAL HIGH (ref <130)
Total CHOL/HDL Ratio: 7.4 (calc) — ABNORMAL HIGH (ref <5.0)
Triglycerides: 257 mg/dL — ABNORMAL HIGH (ref <150)

## 2019-10-28 LAB — HEMOGLOBIN A1C
Hgb A1c MFr Bld: 5.8 % of total Hgb — ABNORMAL HIGH (ref <5.7)
Mean Plasma Glucose: 120 (calc)
eAG (mmol/L): 6.6 (calc)

## 2019-10-28 NOTE — Progress Notes (Signed)
Mazen,   A1C is down to 5.8! Great job. Your random sugar was still a little high so I do want you to stay on metformin for now. Do you need refills?  Kidney, liver look good.  LdL goal is under 70 and you are 168. HDL low and TG elevated. I would suggest a statin to lower your overall CV risk. Thoughts?

## 2019-10-31 MED ORDER — METFORMIN HCL 500 MG PO TABS: 500.0000 mg | ORAL_TABLET | Freq: Two times a day (BID) | ORAL | 2 refills | Status: DC

## 2019-10-31 NOTE — Telephone Encounter (Signed)
Please send statin.

## 2019-11-04 MED ORDER — ATORVASTATIN CALCIUM 40 MG PO TABS
40.0000 mg | ORAL_TABLET | Freq: Every day | ORAL | 3 refills | Status: DC
Start: 2019-11-04 — End: 2019-11-26

## 2019-11-26 ENCOUNTER — Telehealth (INDEPENDENT_AMBULATORY_CARE_PROVIDER_SITE_OTHER): Payer: 59 | Admitting: Physician Assistant

## 2019-11-26 ENCOUNTER — Encounter: Payer: Self-pay | Admitting: Physician Assistant

## 2019-11-26 VITALS — Ht 72.0 in | Wt 282.0 lb

## 2019-11-26 DIAGNOSIS — R41840 Attention and concentration deficit: Secondary | ICD-10-CM

## 2019-11-26 DIAGNOSIS — E1169 Type 2 diabetes mellitus with other specified complication: Secondary | ICD-10-CM | POA: Diagnosis not present

## 2019-11-26 DIAGNOSIS — F419 Anxiety disorder, unspecified: Secondary | ICD-10-CM | POA: Diagnosis not present

## 2019-11-26 DIAGNOSIS — E785 Hyperlipidemia, unspecified: Secondary | ICD-10-CM | POA: Diagnosis not present

## 2019-11-26 MED ORDER — BUPROPION HCL ER (XL) 150 MG PO TB24: 150.0000 mg | ORAL_TABLET | ORAL | 0 refills | Status: DC

## 2019-11-26 MED ORDER — METFORMIN HCL 1000 MG PO TABS: 1000.0000 mg | ORAL_TABLET | Freq: Two times a day (BID) | ORAL | 0 refills | Status: DC

## 2019-11-26 MED ORDER — ROSUVASTATIN CALCIUM 5 MG PO TABS: 5.0000 mg | ORAL_TABLET | Freq: Every day | ORAL | 1 refills | Status: DC

## 2019-11-26 NOTE — Progress Notes (Signed)
Patient ID: Kenneth Mcbride, male   DOB: February 15, 1986, 34 y.o.   MRN: [id number] .Marland KitchenVirtual Visit via Video Note  I connected with Kenneth Mcbride on 11/26/2019 at  9:50 AM EDT by a video enabled telemedicine application and verified that I am speaking with the correct person using two identifiers.  Location: Patient: home Provider: clinic   I discussed the limitations of evaluation and management by telemedicine and the availability of in person appointments. The patient expressed understanding and agreed to proceed.  History of Present Illness: Pt is a 34 yo male with T2DM and Dyslipidemia who presents to the clinic with worsening anxiety and mood.   Admits sugars are rising. He is seeing more morning sugars of 160s to 180s. He denies any hypoglycemic events. He feels like lipitor is causing this to be worse.   He is also more anxious and irritable. He finds he struggles to get things done. He had ADHD as a child and was on medication for a little while. He is unaware of anxiety is coming more from that or just anxiety. Worse for last 3 months. Working from home. No SI/HC.   [name]. Active Ambulatory Problems    Diagnosis Date Noted  . Closed fracture of proximal phalanx of third finger of left hand 11/28/2013  . Tendinitis of left hand 01/16/2014  . Lumbar spinal stenosis 05/20/2019  . Class 2 severe obesity due to excess calories with serious comorbidity and body mass index (BMI) of 38.0 to 38.9 in adult (HCC) 08/12/2019  . Diabetes mellitus type II, controlled (HCC) 08/12/2019  . Spinal stenosis 08/27/2019  . Injury of left ring finger 09/22/2019  . Left fourth distal phalangeal exostosis 09/22/2019  . Dyslipidemia 10/28/2019  . Anxiety 12/01/2019   Resolved Ambulatory Problems    Diagnosis Date Noted  . No Resolved Ambulatory Problems   Past Medical History:  Diagnosis Date  . Diabetes mellitus without complication (HCC)   . Family history of adverse reaction to anesthesia   . No  pertinent past medical history    Reviewed med, allergy, problem list.    Observations/Objective: Normal mood and appearance No distress Normal breathing  .[name] Today's Vitals   11/26/19 0925  Weight: 282 lb (127.9 kg)  Height: 6' (1.829 m)   Body mass index is 38.25 kg/m.   .. Depression screen [facility] 2/9 11/26/2019 07/21/2019  Decreased Interest 0 3  Down, Depressed, Hopeless 0 1  PHQ - 2 Score 0 4  Altered sleeping 0 3  Tired, decreased energy 1 2  Change in appetite 1 0  Feeling bad or failure about yourself  0 1  Trouble concentrating 3 1  Moving slowly or fidgety/restless 0 0  Suicidal thoughts 0 0  PHQ-9 Score 5 11  Difficult doing work/chores Somewhat difficult Not difficult at all    GAD 7 : Generalized Anxiety Score 11/26/2019 07/21/2019  Nervous, Anxious, on Edge 3 0  Control/stop worrying 3 0  Worry too much - different things 3 2  Trouble relaxing 3 3  Restless 1 3  Easily annoyed or irritable 3 1  Afraid - awful might happen 3 3  Total GAD 7 Score 19 12  Anxiety Difficulty Very difficult Not difficult at all     Assessment and Plan: Marland KitchenMarland KitchenZackarey was seen today for anxiety.  Diagnoses and all orders for this visit:  Anxiety -     buPROPion (WELLBUTRIN XL) 150 MG 24 hr tablet; Take 1 tablet (150 mg total) by mouth every morning. -  Ambulatory referral to Psychology  Inattention -     buPROPion (WELLBUTRIN XL) 150 MG 24 hr tablet; Take 1 tablet (150 mg total) by mouth every morning. -     Ambulatory referral to Psychology  Dyslipidemia -     rosuvastatin (CRESTOR) 5 MG tablet; Take 1 tablet (5 mg total) by mouth daily.  Controlled type 2 diabetes mellitus with other specified complication, without long-term current use of insulin (HCC) -     metFORMIN (GLUCOPHAGE) 1000 MG tablet; Take 1 tablet (1,000 mg total) by mouth 2 (two) times daily with a meal.   Symptoms of anxiety but also lack of focus. Hx of ritalin as a child. Would like to get him ADHD  tested. Will send via mychart ADHD self screening. Start wellbutrin. Discussed side effects. Follow up in 1-2 months.   Stop lipitor. Start crestor. Will see if has better effects on sugars.   Increased metformin to help with increase of blood sugar. Reminded patient of DM diet.    Follow Up Instructions:    I discussed the assessment and treatment plan with the patient. The patient was provided an opportunity to ask questions and all were answered. The patient agreed with the plan and demonstrated an understanding of the instructions.   The patient was advised to call back or seek an in-person evaluation if the symptoms worsen or if the condition fails to improve as anticipated.  I provided 30 minutes of non-face-to-face time during this encounter.    Tandy Gaw, PA-C

## 2019-11-26 NOTE — Progress Notes (Signed)
Wants to discuss anxiety PHQ9 (5) -GAD7 (19) completed.

## 2019-11-28 ENCOUNTER — Encounter: Payer: Self-pay | Admitting: Physician Assistant

## 2019-12-01 DIAGNOSIS — F419 Anxiety disorder, unspecified: Secondary | ICD-10-CM | POA: Insufficient documentation

## 2019-12-25 ENCOUNTER — Encounter: Payer: Self-pay | Admitting: Physician Assistant

## 2019-12-26 ENCOUNTER — Ambulatory Visit (INDEPENDENT_AMBULATORY_CARE_PROVIDER_SITE_OTHER): Payer: 59 | Admitting: Nurse Practitioner

## 2019-12-26 ENCOUNTER — Encounter: Payer: Self-pay | Admitting: Nurse Practitioner

## 2019-12-26 ENCOUNTER — Ambulatory Visit (INDEPENDENT_AMBULATORY_CARE_PROVIDER_SITE_OTHER): Payer: 59

## 2019-12-26 ENCOUNTER — Other Ambulatory Visit: Payer: Self-pay

## 2019-12-26 VITALS — BP 137/84 | HR 83 | Wt 286.0 lb

## 2019-12-26 DIAGNOSIS — R42 Dizziness and giddiness: Secondary | ICD-10-CM | POA: Diagnosis not present

## 2019-12-26 DIAGNOSIS — R519 Headache, unspecified: Secondary | ICD-10-CM | POA: Insufficient documentation

## 2019-12-26 DIAGNOSIS — R030 Elevated blood-pressure reading, without diagnosis of hypertension: Secondary | ICD-10-CM | POA: Diagnosis not present

## 2019-12-26 MED ORDER — LISINOPRIL 5 MG PO TABS: 5.0000 mg | ORAL_TABLET | Freq: Every day | ORAL | 3 refills | Status: DC

## 2019-12-26 MED ORDER — SUMATRIPTAN SUCCINATE 50 MG PO TABS: ORAL_TABLET | ORAL | 3 refills | Status: DC

## 2019-12-26 NOTE — Patient Instructions (Signed)
Sumatriptan tablets What is this medicine? SUMATRIPTAN (soo ma TRIP tan) is used to treat migraines with or without aura. An aura is a strange feeling or visual disturbance that warns you of an attack. It is not used to prevent migraines. This medicine may be used for other purposes; ask your health care provider or pharmacist if you have questions. COMMON BRAND NAME(S): Imitrex, Migraine Pack What should I tell my health care provider before I take this medicine? They need to know if you have any of these conditions:  cigarette smoker  circulation problems in fingers and toes  diabetes  heart disease  high blood pressure  high cholesterol  history of irregular heartbeat  history of stroke  kidney disease  liver disease  stomach or intestine problems  an unusual or allergic reaction to sumatriptan, other medicines, foods, dyes, or preservatives  pregnant or trying to get pregnant  breast-feeding How should I use this medicine? Take this medicine by mouth with a glass of water. Follow the directions on the prescription label. Do not take it more often than directed. Talk to your pediatrician regarding the use of this medicine in children. Special care may be needed. Overdosage: If you think you have taken too much of this medicine contact a poison control center or emergency room at once. NOTE: This medicine is only for you. Do not share this medicine with others. What if I miss a dose? This does not apply. This medicine is not for regular use. What may interact with this medicine? Do not take this medicine with any of the following medicines:  certain medicines for migraine headache like almotriptan, eletriptan, frovatriptan, naratriptan, rizatriptan, sumatriptan, zolmitriptan  ergot alkaloids like dihydroergotamine, ergonovine, ergotamine, methylergonovine  MAOIs like Carbex, Eldepryl, Marplan, Nardil, and Parnate This medicine may also interact with the following  medications:  certain medicines for depression, anxiety, or psychotic disorders This list may not describe all possible interactions. Give your health care provider a list of all the medicines, herbs, non-prescription drugs, or dietary supplements you use. Also tell them if you smoke, drink alcohol, or use illegal drugs. Some items may interact with your medicine. What should I watch for while using this medicine? Visit your healthcare professional for regular checks on your progress. Tell your healthcare professional if your symptoms do not start to get better or if they get worse. You may get drowsy or dizzy. Do not drive, use machinery, or do anything that needs mental alertness until you know how this medicine affects you. Do not stand up or sit up quickly, especially if you are an older patient. This reduces the risk of dizzy or fainting spells. Alcohol may interfere with the effect of this medicine. Tell your healthcare professional right away if you have any change in your eyesight. If you take migraine medicines for 10 or more days a month, your migraines may get worse. Keep a diary of headache days and medicine use. Contact your healthcare professional if your migraine attacks occur more frequently. What side effects may I notice from receiving this medicine? Side effects that you should report to your doctor or health care professional as soon as possible:  allergic reactions like skin rash, itching or hives, swelling of the face, lips, or tongue  changes in vision  chest pain or chest tightness  signs and symptoms of a dangerous change in heartbeat or heart rhythm like chest pain; dizziness; fast, irregular heartbeat; palpitations; feeling faint or lightheaded; falls; breathing problems  signs  and symptoms of a stroke like changes in vision; confusion; trouble speaking or understanding; severe headaches; sudden numbness or weakness of the face, arm or leg; trouble walking; dizziness;  loss of balance or coordination  signs and symptoms of serotonin syndrome like irritable; confusion; diarrhea; fast or irregular heartbeat; muscle twitching; stiff muscles; trouble walking; sweating; high fever; seizures; chills; vomiting Side effects that usually do not require medical attention (report to your doctor or health care professional if they continue or are bothersome):  diarrhea  dizziness  drowsiness  dry mouth  headache  nausea, vomiting  pain, tingling, numbness in the hands or feet  stomach pain This list may not describe all possible side effects. Call your doctor for medical advice about side effects. You may report side effects to FDA at 1-800-FDA-1088. Where should I keep my medicine? Keep out of the reach of children. Store at room temperature between 2 and 30 degrees C (36 and 86 degrees F). Throw away any unused medicine after the expiration date. NOTE: This sheet is a summary. It may not cover all possible information. If you have questions about this medicine, talk to your doctor, pharmacist, or health care provider.  2020 Elsevier/Gold Standard (2017-08-28 15:05:37)   Lisinopril Tablets What is this medicine? LISINOPRIL (lyse IN oh pril) is an ACE inhibitor. It treats high blood pressure and heart failure. It can treat heart damage after a heart attack. This medicine may be used for other purposes; ask your health care provider or pharmacist if you have questions. COMMON BRAND NAME(S): Prinivil, Zestril What should I tell my health care provider before I take this medicine? They need to know if you have any of these conditions:  diabetes  heart or blood vessel disease  kidney disease  low blood pressure  previous swelling of the tongue, face, or lips with difficulty breathing, difficulty swallowing, hoarseness, or tightening of the throat  an unusual or allergic reaction to lisinopril, other ACE inhibitors, insect venom, foods, dyes, or  preservatives  pregnant or trying to get pregnant  breast-feeding How should I use this medicine? Take this drug by mouth. Take it as directed on the prescription label at the same time every day. You can take it with or without food. If it upsets your stomach, take it with food. Keep taking it unless your health care provider tells you to stop. Talk to your health care provider about the use of this drug in children. While it may be prescribed for children as young as 6 for selected conditions, precautions do apply. Overdosage: If you think you have taken too much of this medicine contact a poison control center or emergency room at once. NOTE: This medicine is only for you. Do not share this medicine with others. What if I miss a dose? If you miss a dose, take it as soon as you can. If it is almost time for your next dose, take only that dose. Do not take double or extra doses. What may interact with this medicine? Do not take this medicine with any of the following medications:  hymenoptera venom  sacubitril; valsartan This medicines may also interact with the following medications:  aliskiren  angiotensin receptor blockers, like losartan or valsartan  certain medicines for diabetes  diuretics  everolimus  gold compounds  lithium  NSAIDs, medicines for pain and inflammation, like ibuprofen or naproxen  potassium salts or supplements  salt substitutes  sirolimus  temsirolimus This list may not describe all possible interactions.  Give your health care provider a list of all the medicines, herbs, non-prescription drugs, or dietary supplements you use. Also tell them if you smoke, drink alcohol, or use illegal drugs. Some items may interact with your medicine. What should I watch for while using this medicine? Visit your doctor or health care professional for regular check ups. Check your blood pressure as directed. Ask your doctor what your blood pressure should be, and  when you should contact him or her. Do not treat yourself for coughs, colds, or pain while you are using this medicine without asking your doctor or health care professional for advice. Some ingredients may increase your blood pressure. Women should inform their doctor if they wish to become pregnant or think they might be pregnant. There is a potential for serious side effects to an unborn child. Talk to your health care professional or pharmacist for more information. Check with your doctor or health care professional if you get an attack of severe diarrhea, nausea and vomiting, or if you sweat a lot. The loss of too much body fluid can make it dangerous for you to take this medicine. You may get drowsy or dizzy. Do not drive, use machinery, or do anything that needs mental alertness until you know how this drug affects you. Do not stand or sit up quickly, especially if you are an older patient. This reduces the risk of dizzy or fainting spells. Alcohol can make you more drowsy and dizzy. Avoid alcoholic drinks. Avoid salt substitutes unless you are told otherwise by your doctor or health care professional. What side effects may I notice from receiving this medicine? Side effects that you should report to your doctor or health care professional as soon as possible:  allergic reactions like skin rash, itching or hives, swelling of the hands, feet, face, lips, throat, or tongue  breathing problems  signs and symptoms of kidney injury like trouble passing urine or change in the amount of urine  signs and symptoms of increased potassium like muscle weakness; chest pain; or fast, irregular heartbeat  signs and symptoms of liver injury like dark yellow or brown urine; general ill feeling or flu-like symptoms; light-colored stools; loss of appetite; nausea; right upper belly pain; unusually weak or tired; yellowing of the eyes or skin  signs and symptoms of low blood pressure like dizziness; feeling  faint or lightheaded, falls; unusually weak or tired  stomach pain with or without nausea and vomiting Side effects that usually do not require medical attention (report to your doctor or health care professional if they continue or are bothersome):  changes in taste  cough  dizziness  fever  headache  sensitivity to light This list may not describe all possible side effects. Call your doctor for medical advice about side effects. You may report side effects to FDA at 1-800-FDA-1088. Where should I keep my medicine? Keep out of the reach of children and pets. Store at room temperature between 20 and 25 degrees C (68 and 77 degrees F). Protect from moisture. Keep the container tightly closed. Do not freeze. Avoid exposure to extreme heat. Throw away any unused drug after the expiration date. NOTE: This sheet is a summary. It may not cover all possible information. If you have questions about this medicine, talk to your doctor, pharmacist, or health care provider.  2020 Elsevier/Gold Standard (2018-09-18 11:38:35)

## 2019-12-26 NOTE — Assessment & Plan Note (Signed)
Current blood pressure in office today 137/84. Blood pressures at home elevated into the 140's over 90's for the past 2 days.  Given the patients onset of sudden headache with elevated blood pressure readings, will plan to rule out acute bleed.  If CT negative, plan to begin low dose blood pressure medication.

## 2019-12-26 NOTE — Addendum Note (Signed)
Addended by: Roe Koffman, Huntley Dec E on: 12/26/2019 05:36 PM   Modules accepted: Orders

## 2019-12-26 NOTE — Assessment & Plan Note (Signed)
Sudden onset of 10/10 headache reportedly occurring during period of increased tension continued for 3 days.  Currently exhibiting neurological signs of dizziness and imbalance. Given recent surgical history and supportive neurologic symptoms of dizziness and balance issues, it is necessary to rule out acute bleed prior to initializing medication for headache. Will plan for STAT CT without contrast today.  If CT is negative for acute process, will plan to proceed with medication management for headache and treat as acute non-intractable migraine and start low dose antihypertensive.

## 2019-12-26 NOTE — Assessment & Plan Note (Signed)
New dizziness with sudden onset of 10/10 headache x 3 days in the setting of elevated blood pressure readings.  He was started on bupropion 4 weeks ago for mood, which could contribute to this new symptom, but does not explain other symptoms present.  STAT head CT without contrast ordered to rule out bleed.  If CT is negative, plan for acute management of headache and blood pressure to see if symptoms improve.

## 2019-12-26 NOTE — Progress Notes (Signed)
Acute Office Visit  Subjective:    Patient ID: Kenneth Mcbride, male    DOB: 12/21/1985, 34 y.o.   MRN: [id number]  Chief Complaint  Patient presents with  . Headache    HPI  Kenneth Mcbride is a 34 year old male presenting today for severe headache that started on Wednesday of this week.  He reports at the time the headache started it was " the worst headache I have ever had".  He reports at the time of the headache he and his significant other were having an argument and he was stressed and angry.  He states the headache came on suddenly with a sharp sensation.  He reports the headache is located on the right side of his head from the temple to approximately 2 inches posterior to the right ear. The headache area has not moved or increased in size since onset.  He reports that since onset of the headache he has not experienced any headache free moments.  He does report the headache has slightly improved from 10/10 pain at onset to about a 4/10 at the time of this visit.  He has been taking acetaminophen without relief.   He endorses a dull constant pain in the right side of his head with sharp pain sensations when bright light enters his eyes. He endorses sensitivity to light, dizziness (worse when bending over), and issues with maintaining balance. He tells me he was unable to complete physical therapy for his back due to the pain and dizziness.   He denies nausea, vomiting, new weakness in his extremities, vision changes, slurred speech, difficulty swallowing, or new impaired concentration.    He tells me he had spinal surgery June 30th of this year. After the surgery he was found to have to small leaks in the spinal column that had to be patched. He states that he has not had any issues since the surgery.  He also tells me that he was started bupropion 150mg  once a day one month ago. At the same time his PCP also changed his cholesterol medication.   His reported blood pressure at home last  night was 140/92 and this morning 136/92. He does not have a history of hypertension. He does not have a history of headaches or migraines and tells me that he can count the times on one hand that he has had a headache. He reports that he has never experienced anything like this in the past.   He does tell me that his father passed away from a stroke a few years ago and that does concern him. He also tells me that he is dealing with anxiety and attention symptoms. He reports that the first two weeks on the bupropion his mood and attention were worse, but he does feel his mood is improving at this time, but not his attention.    Past Medical History:  Diagnosis Date  . Diabetes mellitus without complication (HCC)   . Family history of adverse reaction to anesthesia    father and grand father both heart stopped during surgery  . No pertinent past medical history   . Spinal stenosis     Past Surgical History:  Procedure Laterality Date  . LUMBAR LAMINECTOMY/DECOMPRESSION MICRODISCECTOMY N/A 08/27/2019   Procedure: LUMBAR 2-LUMBAR 5 DECOMPRESSION;  Surgeon: 08/29/2019, MD;  Location: [facility] OR;  Service: Orthopedics;  Laterality: N/A;  . NO PAST SURGERIES      Family History  Problem Relation Age of Onset  . Diabetes Father   .  Heart disease Father   . Diabetes Paternal Grandfather   . COPD Paternal Grandfather     Social History   Socioeconomic History  . Marital status: Single    Spouse name: Not on file  . Number of children: Not on file  . Years of education: Not on file  . Highest education level: Not on file  Occupational History  . Not on file  Tobacco Use  . Smoking status: Never Smoker  . Smokeless tobacco: Never Used  Vaping Use  . Vaping Use: Never used  Substance and Sexual Activity  . Alcohol use: Yes    Comment: ocassionally  . Drug use: Never  . Sexual activity: Not Currently    Partners: Female  Other Topics Concern  . Not on file  Social History  Narrative  . Not on file   Social Determinants of Health   Financial Resource Strain:   . Difficulty of Paying Living Expenses: Not on file  Food Insecurity:   . Worried About Programme researcher, broadcasting/film/video in the Last Year: Not on file  . Ran Out of Food in the Last Year: Not on file  Transportation Needs:   . Lack of Transportation (Medical): Not on file  . Lack of Transportation (Non-Medical): Not on file  Physical Activity:   . Days of Exercise per Week: Not on file  . Minutes of Exercise per Session: Not on file  Stress:   . Feeling of Stress : Not on file  Social Connections:   . Frequency of Communication with Friends and Family: Not on file  . Frequency of Social Gatherings with Friends and Family: Not on file  . Attends Religious Services: Not on file  . Active Member of Clubs or Organizations: Not on file  . Attends Banker Meetings: Not on file  . Marital Status: Not on file  Intimate Partner Violence:   . Fear of Current or Ex-Partner: Not on file  . Emotionally Abused: Not on file  . Physically Abused: Not on file  . Sexually Abused: Not on file    Outpatient Medications Prior to Visit  Medication Sig Dispense Refill  . acetaminophen (TYLENOL) 500 MG tablet Take 1,000 mg by mouth every 6 (six) hours as needed for moderate pain.    [name] AMBULATORY NON FORMULARY MEDICATION Glucometer lancets, strips to test blood sugar once a day for T2DM. 100 each 0  . buPROPion (WELLBUTRIN XL) 150 MG 24 hr tablet Take 1 tablet (150 mg total) by mouth every morning. 90 tablet 0  . gabapentin (NEURONTIN) 600 MG tablet Take 1 tablet (600 mg total) by mouth 3 (three) times daily. (Patient taking differently: Take 600 mg by mouth at bedtime. ) 90 tablet 3  . GLUCOSAMINE-CHONDROITIN PO Take 1 tablet by mouth daily.    . Magnesium 250 MG TABS Take 250 mg by mouth daily.    . metFORMIN (GLUCOPHAGE) 1000 MG tablet Take 1 tablet (1,000 mg total) by mouth 2 (two) times daily with a meal. 180  tablet 0  . Multiple Vitamin (MULTIVITAMIN WITH MINERALS) TABS tablet Take 1 tablet by mouth daily.    . Potassium 99 MG TABS Take 99 mg by mouth in the morning and at bedtime.    . rosuvastatin (CRESTOR) 5 MG tablet Take 1 tablet (5 mg total) by mouth daily. 90 tablet 1   No facility-administered medications prior to visit.    Allergies  Allergen Reactions  . Buspar [Buspirone]  Made anxiety worse.     Review of Systems All ROS negative except for what is listed in HPI    Objective:    Physical Exam Vitals and nursing note reviewed.  Constitutional:      Appearance: He is well-developed. He is obese.  HENT:     Head: Normocephalic and atraumatic. No right periorbital erythema or left periorbital erythema.     Jaw: No trismus, tenderness, swelling or pain on movement.     Mouth/Throat:     Mouth: Mucous membranes are moist.     Pharynx: Oropharynx is clear.  Eyes:     General: Lids are normal. Lids are everted, no foreign bodies appreciated. Vision grossly intact. Gaze aligned appropriately. No visual field deficit.    Extraocular Movements: Extraocular movements intact.     Right eye: Normal extraocular motion and no nystagmus.     Left eye: Normal extraocular motion and no nystagmus.     Pupils: Pupils are equal, round, and reactive to light.     Funduscopic exam:    Right eye: No hemorrhage or AV nicking. Red reflex present.        Left eye: No hemorrhage or AV nicking. Red reflex present.    Visual Fields: Right eye visual fields normal and left eye visual fields normal.  Cardiovascular:     Rate and Rhythm: Normal rate and regular rhythm.     Heart sounds: No murmur heard.   Pulmonary:     Effort: Pulmonary effort is normal.     Breath sounds: Normal breath sounds.  Musculoskeletal:        General: No swelling or tenderness. Normal range of motion.     Cervical back: Normal range of motion and neck supple. No rigidity.  Lymphadenopathy:     Cervical: No  cervical adenopathy.  Skin:    General: Skin is warm and dry.     Capillary Refill: Capillary refill takes less than 2 seconds.  Neurological:     Mental Status: He is alert and oriented to person, place, and time.     GCS: GCS eye subscore is 4. GCS verbal subscore is 5. GCS motor subscore is 6.     Cranial Nerves: No cranial nerve deficit, dysarthria or facial asymmetry.     Sensory: No sensory deficit.     Motor: No weakness or tremor.     Coordination: Coordination normal. Finger-Nose-Finger Test and Heel to Shin Test normal.     Gait: Gait abnormal.     Deep Tendon Reflexes: Reflexes normal.  Psychiatric:        Mood and Affect: Mood is anxious.        Speech: Speech normal.        Behavior: Behavior normal. Behavior is not agitated.        Cognition and Memory: Cognition is not impaired. Memory is impaired.     BP 137/84   Pulse 83   Wt 286 lb (129.7 kg)   SpO2 98%   BMI 38.79 kg/m  Wt Readings from Last 3 Encounters:  12/26/19 286 lb (129.7 kg)  11/26/19 282 lb (127.9 kg)  10/13/19 282 lb (127.9 kg)    Health Maintenance Due  Topic Date Due  . URINE MICROALBUMIN  Never done  . COVID-19 Vaccine (1) Never done  . OPHTHALMOLOGY EXAM  11/28/2019    There are no preventive care reminders to display for this patient.   No results found for: TSH Lab Results  Component Value  Date   WBC 10.3 08/25/2019   HGB 16.7 08/25/2019   HCT 50.7 08/25/2019   MCV 85.6 08/25/2019   PLT 273 08/25/2019   Lab Results  Component Value Date   NA 139 10/27/2019   K 4.1 10/27/2019   CO2 28 10/27/2019   GLUCOSE 129 (H) 10/27/2019   BUN 17 10/27/2019   CREATININE 0.98 10/27/2019   BILITOT 0.6 10/27/2019   ALKPHOS 75 08/25/2019   AST 21 10/27/2019   ALT 30 10/27/2019   PROT 6.7 10/27/2019   ALBUMIN 4.7 08/25/2019   CALCIUM 9.1 10/27/2019   ANIONGAP 11 08/25/2019   Lab Results  Component Value Date   CHOL 243 (H) 10/27/2019   Lab Results  Component Value Date   HDL 33  (L) 10/27/2019   Lab Results  Component Value Date   LDLCALC 168 (H) 10/27/2019   Lab Results  Component Value Date   TRIG 257 (H) 10/27/2019   Lab Results  Component Value Date   CHOLHDL 7.4 (H) 10/27/2019   Lab Results  Component Value Date   HGBA1C 5.8 (H) 10/27/2019       Assessment & Plan:   Problem List Items Addressed This Visit      Other   Elevated blood pressure reading without diagnosis of hypertension    Current blood pressure in office today 137/84. Blood pressures at home elevated into the 140's over 90's for the past 2 days.  Given the patients onset of sudden headache with elevated blood pressure readings, will plan to rule out acute bleed.  If CT negative, plan to begin low dose blood pressure medication.       Acute nonintractable headache - Primary    Sudden onset of 10/10 headache reportedly occurring during period of increased tension continued for 3 days.  Currently exhibiting neurological signs of dizziness and imbalance. Given recent surgical history and supportive neurologic symptoms of dizziness and balance issues, it is necessary to rule out acute bleed prior to initializing medication for headache. Will plan for STAT CT without contrast today.  If CT is negative for acute process, will plan to proceed with medication management for headache and treat as acute non-intractable migraine and start low dose antihypertensive.        Relevant Orders   CT Head Wo Contrast (Completed)   Dizziness    New dizziness with sudden onset of 10/10 headache x 3 days in the setting of elevated blood pressure readings.  He was started on bupropion 4 weeks ago for mood, which could contribute to this new symptom, but does not explain other symptoms present.  STAT head CT without contrast ordered to rule out bleed.  If CT is negative, plan for acute management of headache and blood pressure to see if symptoms improve.         Head CT negative for acute  process.  Will plan to treat BP with lisinopril 5mg  daily. Patient to monitor blood pressure twice a day and follow-up with PCP at scheduled visit in December or sooner if needed. Report blood pressures less than 100/70 immediately.  Will plan to treat headache with sumatriptan. Pt to follow-up Monday if symptoms not improved or go to emergency room for new or worsening symptoms.    Tollie EthSara E. Dasiah Hooley, NP

## 2020-01-13 ENCOUNTER — Other Ambulatory Visit: Payer: Self-pay

## 2020-01-13 ENCOUNTER — Ambulatory Visit (INDEPENDENT_AMBULATORY_CARE_PROVIDER_SITE_OTHER): Payer: 59 | Admitting: Physician Assistant

## 2020-01-13 ENCOUNTER — Encounter: Payer: Self-pay | Admitting: Physician Assistant

## 2020-01-13 VITALS — BP 121/73 | HR 100 | Ht 72.0 in | Wt 286.0 lb

## 2020-01-13 DIAGNOSIS — E785 Hyperlipidemia, unspecified: Secondary | ICD-10-CM | POA: Diagnosis not present

## 2020-01-13 DIAGNOSIS — G478 Other sleep disorders: Secondary | ICD-10-CM

## 2020-01-13 DIAGNOSIS — R4589 Other symptoms and signs involving emotional state: Secondary | ICD-10-CM

## 2020-01-13 DIAGNOSIS — F419 Anxiety disorder, unspecified: Secondary | ICD-10-CM

## 2020-01-13 DIAGNOSIS — R454 Irritability and anger: Secondary | ICD-10-CM

## 2020-01-13 DIAGNOSIS — R0683 Snoring: Secondary | ICD-10-CM

## 2020-01-13 DIAGNOSIS — R41840 Attention and concentration deficit: Secondary | ICD-10-CM

## 2020-01-13 DIAGNOSIS — Z6838 Body mass index (BMI) 38.0-38.9, adult: Secondary | ICD-10-CM

## 2020-01-13 MED ORDER — PHENTERMINE HCL 37.5 MG PO TABS: 37.5000 mg | ORAL_TABLET | Freq: Every day | ORAL | 0 refills | Status: DC

## 2020-01-13 MED ORDER — SERTRALINE HCL 50 MG PO TABS: 50.0000 mg | ORAL_TABLET | Freq: Every day | ORAL | 3 refills | Status: DC

## 2020-01-13 NOTE — Progress Notes (Signed)
Subjective:    Patient ID: Kenneth Mcbride, male    DOB: [birth year], 34 y.o.   MRN: [id number]  HPI  Patient is a 34 year old male with anxiety and poor focus as well as irritability who presents to the clinic to discuss with his wife.  A few weeks ago he was started on Wellbutrin.  Initially he endorses a lot more irritability.  He then felt like it could be helping in some capacity but then made him way too numb.  His wife did not notice any difference.  Some of the biggest problem is his quickness to anger and irritability.  Wife provides an example of her asking him if he would like to go walk with her and him immediately yelling and becoming defensive about her asking him to walk.  He also provides another example when he was at work getting very frustrated with a client who was not understanding or perhaps not listening to his instruction.  He feels like his biggest problem is lack of focus and feeling like he cannot get things done.  He endorses lots of flight of ideas and obsessive thoughts about different scenarios.  Per patient when he was in kindergarten he was diagnosed with ADD/ADHD and started on Ritalin.  He does not remember the response he had but he was taken off within a year and never restarted.  At last visit I did refer him to be ADHD tested and he was called on 10/12 but per notes he did not schedule an appointment.  He is unaware of this interaction.  He states he was never called.  He admits he is not sleeping well and he endorses snoring.  He certainly wakes up not feeling rested.  Patient denies any suicidal thoughts or homicidal idealizations.  .. Active Ambulatory Problems    Diagnosis Date Noted  . Closed fracture of proximal phalanx of third finger of left hand 11/28/2013  . Tendinitis of left hand 01/16/2014  . Lumbar spinal stenosis 05/20/2019  . Class 2 severe obesity due to excess calories with serious comorbidity and body mass index (BMI) of 38.0 to 38.9 in adult  (HCC) 08/12/2019  . Diabetes mellitus type II, controlled (HCC) 08/12/2019  . Spinal stenosis 08/27/2019  . Injury of left ring finger 09/22/2019  . Left fourth distal phalangeal exostosis 09/22/2019  . Dyslipidemia 10/28/2019  . Anxiety 12/01/2019  . Elevated blood pressure reading without diagnosis of hypertension 12/26/2019  . Acute nonintractable headache 12/26/2019  . Dizziness 12/26/2019  . Depressed mood 01/14/2020  . Irritable 01/14/2020  . Non-restorative sleep 01/14/2020  . Snoring 01/14/2020  . Poor concentration 01/14/2020   Resolved Ambulatory Problems    Diagnosis Date Noted  . No Resolved Ambulatory Problems   Past Medical History:  Diagnosis Date  . Diabetes mellitus without complication (HCC)   . Family history of adverse reaction to anesthesia   . No pertinent past medical history       Review of Systems    see HPI.  Objective:   Physical Exam Vitals reviewed.  Constitutional:      Appearance: Normal appearance. He is obese.  Cardiovascular:     Rate and Rhythm: Normal rate and regular rhythm.     Pulses: Normal pulses.  Pulmonary:     Effort: Pulmonary effort is normal.     Breath sounds: Normal breath sounds.  Neurological:     General: No focal deficit present.     Mental Status: He is alert.  Psychiatric:  Mood and Affect: Mood normal.       .. Depression screen [facility] 2/9 01/13/2020 11/26/2019 07/21/2019  Decreased Interest 3 0 3  Down, Depressed, Hopeless 1 0 1  PHQ - 2 Score 4 0 4  Altered sleeping 0 0 3  Tired, decreased energy 3 1 2   Change in appetite 2 1 0  Feeling bad or failure about yourself  3 0 1  Trouble concentrating 3 3 1   Moving slowly or fidgety/restless 0 0 0  Suicidal thoughts 1 0 0  PHQ-9 Score 16 5 11   Difficult doing work/chores Very difficult Somewhat difficult Not difficult at all   . GAD 7 : Generalized Anxiety Score 01/13/2020 11/26/2019 07/21/2019  Nervous, Anxious, on Edge 3 3 0  Control/stop  worrying 3 3 0  Worry too much - different things 3 3 2   Trouble relaxing 3 3 3   Restless 3 1 3   Easily annoyed or irritable 3 3 1   Afraid - awful might happen 3 3 3   Total GAD 7 Score 21 19 12   Anxiety Difficulty Very difficult Very difficult Not difficult at all       Assessment & Plan:  11/18/202110/1/2021Sigfredo was seen today for depression.  Diagnoses and all orders for this visit:  Anxiety -     sertraline (ZOLOFT) 50 MG tablet; Take 1 tablet (50 mg total) by mouth daily. -     CBC with Differential/Platelet -     TSH -     Home sleep test -     Ambulatory referral to Psychology  Class 2 severe obesity due to excess calories with serious comorbidity and body mass index (BMI) of 38.0 to 38.9 in adult (HCC) -     phentermine (ADIPEX-P) 37.5 MG tablet; Take 1 tablet (37.5 mg total) by mouth daily before breakfast. -     CBC with Differential/Platelet -     COMPLETE METABOLIC PANEL WITH GFR -     Home sleep test  Dyslipidemia -     CBC with Differential/Platelet -     Lipid Panel w/reflex Direct LDL  Poor concentration -     sertraline (ZOLOFT) 50 MG tablet; Take 1 tablet (50 mg total) by mouth daily. -     CBC with Differential/Platelet -     COMPLETE METABOLIC PANEL WITH GFR -     Home sleep test -     Ambulatory referral to Psychology  Snoring -     Home sleep test  Non-restorative sleep -     Home sleep test  Irritable -     sertraline (ZOLOFT) 50 MG tablet; Take 1 tablet (50 mg total) by mouth daily. -     Ambulatory referral to Psychology  Depressed mood -     sertraline (ZOLOFT) 50 MG tablet; Take 1 tablet (50 mg total) by mouth daily. -     Ambulatory referral to Psychology   Spent a significant amount of time today discussing patient's symptoms and how likely his treatment is going to be multifactorial.  Cannot deny he is overweight and he has numerous risk factors for sleep apnea.  I would like for him to get home test.  This could significantly improve how he  feels.  I do not think all of his symptoms represent ADD/ADHD only.  Certainly there could be a component.  I would like for patient to get complete assessment.  We will make referral again today for this to be done.  Do think there  is some type of depression/anxiety/mood disorder.  I would like patient to start on Zoloft.  Discussed one half tab for 7 days then increase to 1 full tab.  Patient is aware this will take some time to be fully beneficial.  Stop Wellbutrin.  I feel like this could have made his anxiety worse.  Patient is actively wanting to lose weight after his back surgery.  We will try 1/2 tablet of phentermine.  This is multifactorial because I would like to see how he responds to a stimulant.  Discussed his mindset and behavioral things to help control his anxiety.  Follow-up in 1 month.  Spent 40 minutes with patient discussing plan.

## 2020-01-14 ENCOUNTER — Encounter: Payer: Self-pay | Admitting: Physician Assistant

## 2020-01-14 DIAGNOSIS — R4589 Other symptoms and signs involving emotional state: Secondary | ICD-10-CM | POA: Insufficient documentation

## 2020-01-14 DIAGNOSIS — R0683 Snoring: Secondary | ICD-10-CM | POA: Insufficient documentation

## 2020-01-14 DIAGNOSIS — R454 Irritability and anger: Secondary | ICD-10-CM | POA: Insufficient documentation

## 2020-01-14 DIAGNOSIS — R41840 Attention and concentration deficit: Secondary | ICD-10-CM | POA: Insufficient documentation

## 2020-01-14 DIAGNOSIS — G478 Other sleep disorders: Secondary | ICD-10-CM | POA: Insufficient documentation

## 2020-01-19 ENCOUNTER — Encounter: Payer: Self-pay | Admitting: Physician Assistant

## 2020-01-31 LAB — COMPLETE METABOLIC PANEL WITH GFR
AG Ratio: 2 (calc) (ref 1.0–2.5)
ALT: 33 U/L (ref 9–46)
AST: 25 U/L (ref 10–40)
Albumin: 4.7 g/dL (ref 3.6–5.1)
Alkaline phosphatase (APISO): 85 U/L (ref 36–130)
BUN: 15 mg/dL (ref 7–25)
CO2: 24 mmol/L (ref 20–32)
Calcium: 9.8 mg/dL (ref 8.6–10.3)
Chloride: 103 mmol/L (ref 98–110)
Creat: 0.96 mg/dL (ref 0.60–1.35)
GFR, Est African American: 117 mL/min/{1.73_m2} (ref >=60)
GFR, Est Non African American: 101 mL/min/{1.73_m2} (ref >=60)
Globulin: 2.4 g/dL (calc) (ref 1.9–3.7)
Glucose, Bld: 93 mg/dL (ref 65–99)
Potassium: 4 mmol/L (ref 3.5–5.3)
Sodium: 139 mmol/L (ref 135–146)
Total Bilirubin: 0.7 mg/dL (ref 0.2–1.2)
Total Protein: 7.1 g/dL (ref 6.1–8.1)

## 2020-01-31 LAB — LIPID PANEL W/REFLEX DIRECT LDL
Cholesterol: 180 mg/dL (ref <200)
HDL: 38 mg/dL — ABNORMAL LOW (ref >=40)
LDL Cholesterol (Calc): 117 mg/dL (calc) — ABNORMAL HIGH
Non-HDL Cholesterol (Calc): 142 mg/dL (calc) — ABNORMAL HIGH (ref <130)
Total CHOL/HDL Ratio: 4.7 (calc) (ref <5.0)
Triglycerides: 132 mg/dL (ref <150)

## 2020-01-31 LAB — CBC WITH DIFFERENTIAL/PLATELET
Absolute Monocytes: 570 cells/uL (ref 200–950)
Basophils Absolute: 51 cells/uL (ref 0–200)
Basophils Relative: 0.6 %
Eosinophils Absolute: 119 cells/uL (ref 15–500)
Eosinophils Relative: 1.4 %
HCT: 45 % (ref 38.5–50.0)
Hemoglobin: 15 g/dL (ref 13.2–17.1)
Lymphs Abs: 3434 cells/uL (ref 850–3900)
MCH: 27.6 pg (ref 27.0–33.0)
MCHC: 33.3 g/dL (ref 32.0–36.0)
MCV: 82.9 fL (ref 80.0–100.0)
MPV: 10.7 fL (ref 7.5–12.5)
Monocytes Relative: 6.7 %
Neutro Abs: 4327 cells/uL (ref 1500–7800)
Neutrophils Relative %: 50.9 %
Platelets: 245 10*3/uL (ref 140–400)
RBC: 5.43 10*6/uL (ref 4.20–5.80)
RDW: 13.2 % (ref 11.0–15.0)
Total Lymphocyte: 40.4 %
WBC: 8.5 10*3/uL (ref 3.8–10.8)

## 2020-01-31 LAB — TSH: TSH: 4.19 mIU/L (ref 0.40–4.50)

## 2020-02-02 ENCOUNTER — Ambulatory Visit (INDEPENDENT_AMBULATORY_CARE_PROVIDER_SITE_OTHER): Payer: 59 | Admitting: Physician Assistant

## 2020-02-02 ENCOUNTER — Other Ambulatory Visit: Payer: Self-pay

## 2020-02-02 ENCOUNTER — Encounter: Payer: Self-pay | Admitting: Physician Assistant

## 2020-02-02 VITALS — BP 124/61 | HR 93 | Ht 72.0 in | Wt 283.0 lb

## 2020-02-02 DIAGNOSIS — F419 Anxiety disorder, unspecified: Secondary | ICD-10-CM | POA: Diagnosis not present

## 2020-02-02 DIAGNOSIS — Z6838 Body mass index (BMI) 38.0-38.9, adult: Secondary | ICD-10-CM

## 2020-02-02 DIAGNOSIS — R4589 Other symptoms and signs involving emotional state: Secondary | ICD-10-CM

## 2020-02-02 DIAGNOSIS — R41840 Attention and concentration deficit: Secondary | ICD-10-CM

## 2020-02-02 DIAGNOSIS — E1169 Type 2 diabetes mellitus with other specified complication: Secondary | ICD-10-CM | POA: Diagnosis not present

## 2020-02-02 DIAGNOSIS — E785 Hyperlipidemia, unspecified: Secondary | ICD-10-CM

## 2020-02-02 LAB — POCT GLYCOSYLATED HEMOGLOBIN (HGB A1C): Hemoglobin A1C: 5.9 % — AB (ref 4.0–5.6)

## 2020-02-02 MED ORDER — PHENTERMINE HCL 37.5 MG PO TABS: 37.5000 mg | ORAL_TABLET | Freq: Every day | ORAL | 0 refills | Status: DC

## 2020-02-02 MED ORDER — ROSUVASTATIN CALCIUM 10 MG PO TABS: 10.0000 mg | ORAL_TABLET | Freq: Every day | ORAL | 3 refills | Status: DC

## 2020-02-02 MED ORDER — METFORMIN HCL 1000 MG PO TABS: 1000.0000 mg | ORAL_TABLET | Freq: Two times a day (BID) | ORAL | 0 refills | Status: DC

## 2020-02-02 MED ORDER — SERTRALINE HCL 100 MG PO TABS: 100.0000 mg | ORAL_TABLET | Freq: Every day | ORAL | 0 refills | Status: DC

## 2020-02-02 NOTE — Patient Instructions (Addendum)
Increase zoloft to 100mg .  Stay on phentermine.  Increase crestor to 10mg  daily.

## 2020-02-02 NOTE — Progress Notes (Signed)
Subjective:    Patient ID: Kenneth Mcbride, male    DOB: October 31, 1985, 34 y.o.   MRN: [id number]  HPI  Patient is a 34 year old obese male with type 2 diabetes, dyslipidemia, depressed mood, anxiety, poor concentration who presents to the clinic for 78-month follow-up.  Patient is taking his Metformin daily.  He is not checking his sugars.  He denies any hypoglycemic events.  He denies any vision changes, chest pain, palpitations.  He remains on lisinopril 5 mg for his blood pressure.  He continues to work on weight loss with phentermine.  He feels like starting phentermine is actually increased his appetite.  He did just go up to 1 full tablet and we will wait and see.  He has lost 7 pounds since last visit 1 month ago.  Patient does report his mood and irritability is much better.  He is still having some trouble with focusing.  He does endorse that Zoloft is helping much more than Wellbutrin did.  It did take him a week or so to transition off Wellbutrin.  He denies any suicidal thoughts or homicidal idealizations.   .. Active Ambulatory Problems    Diagnosis Date Noted  . Closed fracture of proximal phalanx of third finger of left hand 11/28/2013  . Tendinitis of left hand 01/16/2014  . Lumbar spinal stenosis 05/20/2019  . Class 2 severe obesity due to excess calories with serious comorbidity and body mass index (BMI) of 38.0 to 38.9 in adult (HCC) 08/12/2019  . Diabetes mellitus type II, controlled (HCC) 08/12/2019  . Spinal stenosis 08/27/2019  . Injury of left ring finger 09/22/2019  . Left fourth distal phalangeal exostosis 09/22/2019  . Dyslipidemia 10/28/2019  . Anxiety 12/01/2019  . Elevated blood pressure reading without diagnosis of hypertension 12/26/2019  . Acute nonintractable headache 12/26/2019  . Dizziness 12/26/2019  . Depressed mood 01/14/2020  . Irritable 01/14/2020  . Non-restorative sleep 01/14/2020  . Snoring 01/14/2020  . Poor concentration 01/14/2020    Resolved Ambulatory Problems    Diagnosis Date Noted  . No Resolved Ambulatory Problems   Past Medical History:  Diagnosis Date  . Diabetes mellitus without complication (HCC)   . Family history of adverse reaction to anesthesia   . No pertinent past medical history     Review of Systems  All other systems reviewed and are negative.      Objective:   Physical Exam Vitals reviewed.  Constitutional:      Appearance: Normal appearance. He is obese.  Cardiovascular:     Rate and Rhythm: Normal rate and regular rhythm.     Pulses: Normal pulses.     Heart sounds: Normal heart sounds.  Pulmonary:     Effort: Pulmonary effort is normal.  Neurological:     General: No focal deficit present.     Mental Status: He is alert and oriented to person, place, and time.  Psychiatric:        Mood and Affect: Mood normal.    .. Depression screen [facility] 2/9 02/02/2020 01/13/2020 11/26/2019 07/21/2019  Decreased Interest 1 3 0 3  Down, Depressed, Hopeless 0 1 0 1  PHQ - 2 Score 1 4 0 4  Altered sleeping 1 0 0 3  Tired, decreased energy 1 3 1 2   Change in appetite 0 2 1 0  Feeling bad or failure about yourself  1 3 0 1  Trouble concentrating 2 3 3 1   Moving slowly or fidgety/restless 0 0 0 0  Suicidal thoughts 0 1 0 0  PHQ-9 Score 6 16 5 11   Difficult doing work/chores - Very difficult Somewhat difficult Not difficult at all   . GAD 7 : Generalized Anxiety Score 02/02/2020 01/13/2020 11/26/2019 07/21/2019  Nervous, Anxious, on Edge 1 3 3  0  Control/stop worrying 1 3 3  0  Worry too much - different things 1 3 3 2   Trouble relaxing 2 3 3 3   Restless 2 3 1 3   Easily annoyed or irritable 1 3 3 1   Afraid - awful might happen 1 3 3 3   Total GAD 7 Score 9 21 19 12   Anxiety Difficulty - Very difficult Very difficult Not difficult at all           Assessment & Plan:  07/23/2019 Kenneth Mcbride was seen today for diabetes.  Diagnoses and all orders for this visit:  Controlled type 2 diabetes  mellitus with other specified complication, without long-term current use of insulin (HCC) -     POCT glycosylated hemoglobin (Hb A1C) -     metFORMIN (GLUCOPHAGE) 1000 MG tablet; Take 1 tablet (1,000 mg total) by mouth 2 (two) times daily with a meal.  Class 2 severe obesity due to excess calories with serious comorbidity and body mass index (BMI) of 38.0 to 38.9 in adult (HCC) -     phentermine (ADIPEX-P) 37.5 MG tablet; Take 1 tablet (37.5 mg total) by mouth daily before breakfast.  Depressed mood -     sertraline (ZOLOFT) 100 MG tablet; Take 1 tablet (100 mg total) by mouth daily.  Anxiety -     sertraline (ZOLOFT) 100 MG tablet; Take 1 tablet (100 mg total) by mouth daily.  Poor concentration -     sertraline (ZOLOFT) 100 MG tablet; Take 1 tablet (100 mg total) by mouth daily.  Dyslipidemia -     rosuvastatin (CRESTOR) 10 MG tablet; Take 1 tablet (10 mg total) by mouth daily.   .. Lab Results  Component Value Date   HGBA1C 5.9 (A) 02/02/2020   A1C to goal.  Continue on metformin for now. Could also help with weight loss.  BP to goal. Continue on ACE.  On statin. LDL not to goal. Increase to 10mg  crestor.  UTD foot and eye exam.  Declines flu/covid shot.  Follow up in 3 months.   Mood- great decrease in PHQ/GAD numbers increase zoloft to 100mg  daily. Seems to be doing better. Pt has ADHD and mood testing scheduled.   Get home sleep test.   Continue phentermine for now but if increasing hunger stop.

## 2020-02-19 ENCOUNTER — Other Ambulatory Visit: Payer: Self-pay | Admitting: Physician Assistant

## 2020-02-19 DIAGNOSIS — R41840 Attention and concentration deficit: Secondary | ICD-10-CM

## 2020-02-19 DIAGNOSIS — F419 Anxiety disorder, unspecified: Secondary | ICD-10-CM

## 2020-02-23 ENCOUNTER — Encounter: Payer: Self-pay | Admitting: Physician Assistant

## 2020-02-23 ENCOUNTER — Other Ambulatory Visit: Payer: Self-pay | Admitting: Physician Assistant

## 2020-02-23 DIAGNOSIS — E1169 Type 2 diabetes mellitus with other specified complication: Secondary | ICD-10-CM

## 2020-02-24 ENCOUNTER — Telehealth (INDEPENDENT_AMBULATORY_CARE_PROVIDER_SITE_OTHER): Payer: 59 | Admitting: Physician Assistant

## 2020-02-24 ENCOUNTER — Encounter: Payer: Self-pay | Admitting: Physician Assistant

## 2020-02-24 VITALS — BP 120/82 | HR 95 | Ht 72.0 in | Wt 270.0 lb

## 2020-02-24 DIAGNOSIS — E1169 Type 2 diabetes mellitus with other specified complication: Secondary | ICD-10-CM | POA: Diagnosis not present

## 2020-02-24 DIAGNOSIS — Z6836 Body mass index (BMI) 36.0-36.9, adult: Secondary | ICD-10-CM | POA: Diagnosis not present

## 2020-02-24 MED ORDER — METFORMIN HCL 1000 MG PO TABS: 1000.0000 mg | ORAL_TABLET | Freq: Two times a day (BID) | ORAL | 1 refills | Status: DC

## 2020-02-24 MED ORDER — PHENTERMINE HCL 37.5 MG PO TABS: 37.5000 mg | ORAL_TABLET | Freq: Every day | ORAL | 0 refills | Status: DC

## 2020-02-24 NOTE — Progress Notes (Signed)
Patient ID: Kenneth Mcbride, male   DOB: 03-Mar-1985, 34 y.o.   MRN: [id number] .Marland KitchenVirtual Visit via Video Note  I connected with Kenneth Mcbride on 02/24/20 at 11:10 AM EST by a video enabled telemedicine application and verified that I am speaking with the correct person using two identifiers.  Location: Patient: home Provider: clinic  .Marland KitchenParticipating in visit:  Patient: Kenneth Mcbride Provider: Tandy Gaw PA-C   I discussed the limitations of evaluation and management by telemedicine and the availability of in person appointments. The patient expressed understanding and agreed to proceed.  History of Present Illness: Pt is a 34 yo obese male type 2 diabetes, dyslipidemia, anxiety, depression who presents to the clinic to follow-up on weight loss and phentermine.  Overall he reports since adding Zoloft at last visit his mood is better.  He feels more able to go with the flow.  He denies any side effects except some fatigue.  He is taking at bedtime and now with taking phentermine it is helping with fatigue.  He has lost 13 pounds this month.  He denies any palpitations, chest tightness, headache, constipation. He is staying active.   BS in morning been 90's to 110s. BP 120s/130s/70s  .[name] Active Ambulatory Problems    Diagnosis Date Noted   Closed fracture of proximal phalanx of third finger of left hand 11/28/2013   Tendinitis of left hand 01/16/2014   Lumbar spinal stenosis 05/20/2019   Class 2 severe obesity due to excess calories with serious comorbidity and body mass index (BMI) of 36.0 to 36.9 in adult [facility]) 08/12/2019   Diabetes mellitus type II, controlled (HCC) 08/12/2019   Spinal stenosis 08/27/2019   Injury of left ring finger 09/22/2019   Left fourth distal phalangeal exostosis 09/22/2019   Dyslipidemia 10/28/2019   Anxiety 12/01/2019   Elevated blood pressure reading without diagnosis of hypertension 12/26/2019   Acute nonintractable headache 12/26/2019    Dizziness 12/26/2019   Depressed mood 01/14/2020   Irritable 01/14/2020   Non-restorative sleep 01/14/2020   Snoring 01/14/2020   Poor concentration 01/14/2020   Resolved Ambulatory Problems    Diagnosis Date Noted   No Resolved Ambulatory Problems   Past Medical History:  Diagnosis Date   Diabetes mellitus without complication (HCC)    Family history of adverse reaction to anesthesia    No pertinent past medical history    Reviewed med, allergy, problem list.   Observations/Objective: No acute distress Normal mood and appearance. No labored breathing.   .. Today's Vitals   02/24/20 1019  BP: 120/82  Pulse: 95  Weight: 270 lb (122.5 kg)  Height: 6' (1.829 m)   Body mass index is 36.62 kg/m.    Assessment and Plan: Marland KitchenMarland KitchenJaylend was seen today for medication refill.  Diagnoses and all orders for this visit:  Class 2 severe obesity due to excess calories with serious comorbidity and body mass index (BMI) of 36.0 to 36.9 in adult (HCC) -     phentermine (ADIPEX-P) 37.5 MG tablet; Take 1 tablet (37.5 mg total) by mouth daily before breakfast.  Controlled type 2 diabetes mellitus with other specified complication, without long-term current use of insulin (HCC) -     metFORMIN (GLUCOPHAGE) 1000 MG tablet; Take 1 tablet (1,000 mg total) by mouth 2 (two) times daily with a meal.  ..Discussed low carb diet with 1500 calories and 80g of protein.  Exercising at least 150 minutes a week.  [employer] could be a Chief Technology Officer.  Doing better on  phentermine now.  90 day supply given.      Follow Up Instructions:    I discussed the assessment and treatment plan with the patient. The patient was provided an opportunity to ask questions and all were answered. The patient agreed with the plan and demonstrated an understanding of the instructions.   The patient was advised to call back or seek an in-person evaluation if the symptoms worsen or if the condition fails  to improve as anticipated.    Tandy Gaw, PA-C

## 2020-03-01 ENCOUNTER — Telehealth (INDEPENDENT_AMBULATORY_CARE_PROVIDER_SITE_OTHER): Payer: 59 | Admitting: Physician Assistant

## 2020-03-01 ENCOUNTER — Encounter: Payer: Self-pay | Admitting: Physician Assistant

## 2020-03-01 VITALS — BP 121/76 | Temp 97.6°F

## 2020-03-01 DIAGNOSIS — J329 Chronic sinusitis, unspecified: Secondary | ICD-10-CM | POA: Diagnosis not present

## 2020-03-01 DIAGNOSIS — J40 Bronchitis, not specified as acute or chronic: Secondary | ICD-10-CM | POA: Diagnosis not present

## 2020-03-01 MED ORDER — PREDNISONE 50 MG PO TABS: ORAL_TABLET | ORAL | 0 refills | Status: DC

## 2020-03-01 MED ORDER — AZITHROMYCIN 250 MG PO TABS: ORAL_TABLET | ORAL | 0 refills | Status: DC

## 2020-03-01 NOTE — Progress Notes (Signed)
Patient ID: Kenneth Mcbride, male   DOB: [birth year], 35 y.o.   MRN: [id number] .Marland KitchenVirtual Visit via Video Note  I connected with Kenneth Mcbride on 03/01/2020 at  3:00 PM EST by a video enabled telemedicine application and verified that I am speaking with the correct person using two identifiers.  Location: Patient: home Provider: home  .Marland KitchenParticipating in visit:  Patient: Kenneth Mcbride Provider: Tandy Gaw [facility]   I discussed the limitations of evaluation and management by telemedicine and the availability of in person appointments. The patient expressed understanding and agreed to proceed.  History of Present Illness: Pt is a 35 yo male with 1 week of sinus symptoms that he gets almost every year that is progressing into chest congestion, tightness and ongoing cough. He has not had covid or flu vaccine. He was using mucinex DM which helped for a little while but symptoms have continued. He is getting more and more sinus pressure and ongoing cough. No fever, chills, body aches, ST, SOB or wheezing.  No other sick contacts until yesterday his wife started with symptoms.   .. Active Ambulatory Problems    Diagnosis Date Noted  . Closed fracture of proximal phalanx of third finger of left hand 11/28/2013  . Tendinitis of left hand 01/16/2014  . Lumbar spinal stenosis 05/20/2019  . Class 2 severe obesity due to excess calories with serious comorbidity and body mass index (BMI) of 36.0 to 36.9 in adult [facility]) 08/12/2019  . Diabetes mellitus type II, controlled (HCC) 08/12/2019  . Spinal stenosis 08/27/2019  . Injury of left ring finger 09/22/2019  . Left fourth distal phalangeal exostosis 09/22/2019  . Dyslipidemia 10/28/2019  . Anxiety 12/01/2019  . Elevated blood pressure reading without diagnosis of hypertension 12/26/2019  . Acute nonintractable headache 12/26/2019  . Dizziness 12/26/2019  . Depressed mood 01/14/2020  . Irritable 01/14/2020  . Non-restorative sleep 01/14/2020  . Snoring  01/14/2020  . Poor concentration 01/14/2020   Resolved Ambulatory Problems    Diagnosis Date Noted  . No Resolved Ambulatory Problems   Past Medical History:  Diagnosis Date  . Diabetes mellitus without complication (HCC)   . Family history of adverse reaction to anesthesia   . No pertinent past medical history    Reviewed med, allergy, problem list.      Observations/Objective: No acute distress Normal breathing and able to talk in sentences Dry coughing  .[name] Today's Vitals   03/01/20 1450  BP: 121/76  Temp: 97.6 F (36.4 C)  TempSrc: Oral   There is no height or weight on file to calculate BMI.    Assessment and Plan: Marland KitchenMarland KitchenDiagnoses and all orders for this visit:  Sinobronchitis -     azithromycin (ZITHROMAX Z-PAK) 250 MG tablet; Take 2 tablets (500 mg) on  Day 1,  followed by 1 tablet (250 mg) once daily on Days 2 through 5. -     predniSONE (DELTASONE) 50 MG tablet; One tab PO daily for 5 days.   2 weeks of symptoms out of window for testing for flu/covid/strep. Hx of sinobronchitis.  Sent zpak and prednisone. Rest and hydration. Use albuterol inhaler as needed. Follow up as needed.  Wife just started having symptoms. Sent for covid testing.    Follow Up Instructions:    I discussed the assessment and treatment plan with the patient. The patient was provided an opportunity to ask questions and all were answered. The patient agreed with the plan and demonstrated an understanding of the instructions.   The  patient was advised to call back or seek an in-person evaluation if the symptoms worsen or if the condition fails to improve as anticipated.   Iran Planas, PA-C

## 2020-04-19 ENCOUNTER — Ambulatory Visit: Payer: 59 | Admitting: Psychology

## 2020-04-19 ENCOUNTER — Encounter: Payer: Self-pay | Admitting: Physician Assistant

## 2020-04-23 ENCOUNTER — Other Ambulatory Visit: Payer: Self-pay | Admitting: Physician Assistant

## 2020-04-23 NOTE — Telephone Encounter (Signed)
Patient also left a vm re: these symptoms. He states sinus issues, chills, sweats, fatigue. Please advise.

## 2020-04-23 NOTE — Telephone Encounter (Signed)
Sounds like a URI viral sinusitis. Tylenol cold sinus severe and flonase with rest and hydration. He just had covid.

## 2020-04-28 ENCOUNTER — Other Ambulatory Visit: Payer: Self-pay | Admitting: Physician Assistant

## 2020-04-28 DIAGNOSIS — F419 Anxiety disorder, unspecified: Secondary | ICD-10-CM

## 2020-04-28 DIAGNOSIS — R4589 Other symptoms and signs involving emotional state: Secondary | ICD-10-CM

## 2020-04-28 DIAGNOSIS — R41840 Attention and concentration deficit: Secondary | ICD-10-CM

## 2020-05-03 ENCOUNTER — Ambulatory Visit (INDEPENDENT_AMBULATORY_CARE_PROVIDER_SITE_OTHER): Payer: 59 | Admitting: Physician Assistant

## 2020-05-03 ENCOUNTER — Other Ambulatory Visit: Payer: Self-pay

## 2020-05-03 ENCOUNTER — Encounter: Payer: Self-pay | Admitting: Physician Assistant

## 2020-05-03 VITALS — BP 113/77 | HR 88 | Ht 72.0 in | Wt 266.0 lb

## 2020-05-03 DIAGNOSIS — R41840 Attention and concentration deficit: Secondary | ICD-10-CM

## 2020-05-03 DIAGNOSIS — L918 Other hypertrophic disorders of the skin: Secondary | ICD-10-CM

## 2020-05-03 DIAGNOSIS — E1169 Type 2 diabetes mellitus with other specified complication: Secondary | ICD-10-CM | POA: Diagnosis not present

## 2020-05-03 DIAGNOSIS — R4589 Other symptoms and signs involving emotional state: Secondary | ICD-10-CM

## 2020-05-03 DIAGNOSIS — R195 Other fecal abnormalities: Secondary | ICD-10-CM | POA: Insufficient documentation

## 2020-05-03 LAB — POCT GLYCOSYLATED HEMOGLOBIN (HGB A1C): Hemoglobin A1C: 5.5 % (ref 4.0–5.6)

## 2020-05-03 MED ORDER — METFORMIN HCL 500 MG PO TABS: 500.0000 mg | ORAL_TABLET | Freq: Two times a day (BID) | ORAL | 0 refills | Status: DC

## 2020-05-03 NOTE — Progress Notes (Signed)
Subjective:    Patient ID: Kenneth Mcbride, male    DOB: 07-Jun-1985, 35 y.o.   MRN: [id number]  HPI  Patient is a 35 year old obese male with type 2 diabetes, hyperlipidemia, poor concentration, depressed mood who presents to the clinic for follow-up.  Patient is checking his sugars and range about 113 every morning fasting.  He is only on Metformin.  He is walking more.  He is trying to keep to a low sugar low-carb diet.  He denies any hypoglycemic events.  He denies any open sores or wounds.  Patient not have any chest pain, palpitations, headaches.  He has been off his Zoloft and phentermine for 2 weeks to prepare for ADHD testing.  He has not had any major mood changes.  He feels like he could stay off Zoloft and does not need it from moving.  Since his back has fully resolved and is able to do more things he feels healthier and happier.  The phentermine was helping with his focus and his energy.  He does feel not having this.  Patient does mention some more recent stool consistency changes.  His stools are still soft but harder to get completely clean when he is wiping.  He noticed some residual feces when he wipes even a second time.no abdominal pain or constipation.  Wonders if it could be a medication.  Patient has some skin tags and moles he would like looked up   .[name] Active Ambulatory Problems    Diagnosis Date Noted  . Closed fracture of proximal phalanx of third finger of left hand 11/28/2013  . Tendinitis of left hand 01/16/2014  . Lumbar spinal stenosis 05/20/2019  . Class 2 severe obesity due to excess calories with serious comorbidity and body mass index (BMI) of 36.0 to 36.9 in adult [facility]) 08/12/2019  . Diabetes mellitus type II, controlled (HCC) 08/12/2019  . Spinal stenosis 08/27/2019  . Injury of left ring finger 09/22/2019  . Left fourth distal phalangeal exostosis 09/22/2019  . Dyslipidemia 10/28/2019  . Anxiety 12/01/2019  . Elevated blood pressure reading without  diagnosis of hypertension 12/26/2019  . Acute nonintractable headache 12/26/2019  . Dizziness 12/26/2019  . Depressed mood 01/14/2020  . Irritable 01/14/2020  . Non-restorative sleep 01/14/2020  . Snoring 01/14/2020  . Poor concentration 01/14/2020  . Change in consistency of stool 05/03/2020  . Skin tag 05/03/2020   Resolved Ambulatory Problems    Diagnosis Date Noted  . No Resolved Ambulatory Problems   Past Medical History:  Diagnosis Date  . Diabetes mellitus without complication (HCC)   . Family history of adverse reaction to anesthesia   . No pertinent past medical history     Review of Systems   see HPI.  Objective:   Physical Exam Vitals reviewed.  Constitutional:      Appearance: Normal appearance.  HENT:     Head: Normocephalic.  Cardiovascular:     Rate and Rhythm: Normal rate and regular rhythm.     Pulses: Normal pulses.     Heart sounds: Normal heart sounds.  Skin:    Comments: Skin tags round neck, multiple.  Right temple non pigmented nevus.   Neurological:     General: No focal deficit present.     Mental Status: He is alert and oriented to person, place, and time.  Psychiatric:        Mood and Affect: Mood normal.        .. Lab Results  Component Value Date  HGBA1C 5.5 05/03/2020   .[name] Depression screen [facility] 2/9 02/02/2020 01/13/2020 11/26/2019 07/21/2019  Decreased Interest 1 3 0 3  Down, Depressed, Hopeless 0 1 0 1  PHQ - 2 Score 1 4 0 4  Altered sleeping 1 0 0 3  Tired, decreased energy 1 3 1 2   Change in appetite 0 2 1 0  Feeling bad or failure about yourself  1 3 0 1  Trouble concentrating 2 3 3 1   Moving slowly or fidgety/restless 0 0 0 0  Suicidal thoughts 0 1 0 0  PHQ-9 Score 6 16 5 11   Difficult doing work/chores - Very difficult Somewhat difficult Not difficult at all   . GAD 7 : Generalized Anxiety Score 02/02/2020 01/13/2020 11/26/2019 07/21/2019  Nervous, Anxious, on Edge 1 3 3  0  Control/stop worrying 1 3 3  0  Worry  too much - different things 1 3 3 2   Trouble relaxing 2 3 3 3   Restless 2 3 1 3   Easily annoyed or irritable 1 3 3 1   Afraid - awful might happen 1 3 3 3   Total GAD 7 Score 9 21 19 12   Anxiety Difficulty - Very difficult Very difficult Not difficult at all        Assessment & Plan:  11/18/202110/1/2021Christ was seen today for diabetes.  Diagnoses and all orders for this visit:  Controlled type 2 diabetes mellitus with other specified complication, without long-term current use of insulin (HCC) -     POCT glycosylated hemoglobin (Hb A1C) -     metFORMIN (GLUCOPHAGE) 500 MG tablet; Take 1 tablet (500 mg total) by mouth 2 (two) times daily with a meal.  Depressed mood  Poor concentration  Change in consistency of stool  Skin tag    A1C is great at 5.5. decreased metformin to 500mg  bid.  Recheck in 3 months.  On ACE. BP to goal.  On STATIN.  Eye exam and foot exam UTD.  Pneumonia/covid/flu shot UTD.   Metformin could be causing some looser stools and not able to clean as well. Get flushable wipes. Maybe decrease of metformin will help.   Testing next week to determine if needs treatment for anxiety/depression/ADHD. Off meds for 2 weeks. Focus is terrible. Mood is good.  PHQ and GAD much improved.   Follow up after testing.   Skin tags around neck. Ok to make appt for removal.

## 2020-05-03 NOTE — Patient Instructions (Signed)
Decrease metformin 500mg  twice a day.

## 2020-05-10 ENCOUNTER — Ambulatory Visit: Payer: 59 | Admitting: Psychology

## 2020-05-18 ENCOUNTER — Encounter: Payer: Self-pay | Admitting: Physician Assistant

## 2020-05-19 ENCOUNTER — Telehealth (INDEPENDENT_AMBULATORY_CARE_PROVIDER_SITE_OTHER): Payer: 59 | Admitting: Physician Assistant

## 2020-05-19 ENCOUNTER — Encounter: Payer: Self-pay | Admitting: Physician Assistant

## 2020-05-19 VITALS — BP 100/77 | HR 116 | Temp 98.5°F | Ht 72.0 in | Wt 266.0 lb

## 2020-05-19 DIAGNOSIS — R52 Pain, unspecified: Secondary | ICD-10-CM | POA: Diagnosis not present

## 2020-05-19 DIAGNOSIS — R000 Tachycardia, unspecified: Secondary | ICD-10-CM

## 2020-05-19 DIAGNOSIS — R519 Headache, unspecified: Secondary | ICD-10-CM | POA: Diagnosis not present

## 2020-05-19 DIAGNOSIS — R6883 Chills (without fever): Secondary | ICD-10-CM | POA: Diagnosis not present

## 2020-05-19 LAB — POCT INFLUENZA A/B
Influenza A, POC: NEGATIVE
Influenza B, POC: NEGATIVE

## 2020-05-19 MED ORDER — IBUPROFEN 800 MG PO TABS
800.0000 mg | ORAL_TABLET | Freq: Three times a day (TID) | ORAL | 0 refills | Status: DC | PRN
Start: 2020-05-19 — End: 2021-01-13

## 2020-05-19 NOTE — Progress Notes (Signed)
Started late yesterday Muscle aches Headache Chills Low grade fever (99)  No N/V/D  Taken tylenol, no other meds

## 2020-05-19 NOTE — Progress Notes (Signed)
Patient ID: Kenneth Mcbride, male   DOB: Aug 03, 1985, 35 y.o.   MRN: [id number] .Marland KitchenVirtual Visit via Video Note  I connected with Kenneth Mcbride on 05/19/2020 at  1:20 PM EDT by a video enabled telemedicine application and verified that I am speaking with the correct person using two identifiers.  Location: Patient: home Provider: clinic  .Marland KitchenParticipating in visit:  Patient: Kenneth Mcbride  Provider: Tandy Gaw PA-C   I discussed the limitations of evaluation and management by telemedicine and the availability of in person appointments. The patient expressed understanding and agreed to proceed.  History of Present Illness: Patient is a 35 year old male who calls into the clinic with muscle aches, chills, headache, low-grade fever, malaise that started late yesterday suddenly.  He denies any sick contacts.  He has had COVID twice and fully vaccinated.  His last time having Covid was January.  He denies any cough, shortness of breath, wheezing, he denies any sore throat or sinus pressure.  He denies any GI symptoms of nausea, vomiting, diarrhea.  He has not started any new medications or suddenly stopped any new medication.  He has taken Tylenol with minimal relief.   .. Active Ambulatory Problems    Diagnosis Date Noted  . Closed fracture of proximal phalanx of third finger of left hand 11/28/2013  . Tendinitis of left hand 01/16/2014  . Lumbar spinal stenosis 05/20/2019  . Class 2 severe obesity due to excess calories with serious comorbidity and body mass index (BMI) of 36.0 to 36.9 in adult [facility]) 08/12/2019  . Diabetes mellitus type II, controlled (HCC) 08/12/2019  . Spinal stenosis 08/27/2019  . Injury of left ring finger 09/22/2019  . Left fourth distal phalangeal exostosis 09/22/2019  . Dyslipidemia 10/28/2019  . Anxiety 12/01/2019  . Elevated blood pressure reading without diagnosis of hypertension 12/26/2019  . Acute nonintractable headache 12/26/2019  . Dizziness 12/26/2019  . Depressed  mood 01/14/2020  . Irritable 01/14/2020  . Non-restorative sleep 01/14/2020  . Snoring 01/14/2020  . Poor concentration 01/14/2020  . Change in consistency of stool 05/03/2020  . Skin tag 05/03/2020   Resolved Ambulatory Problems    Diagnosis Date Noted  . No Resolved Ambulatory Problems   Past Medical History:  Diagnosis Date  . Diabetes mellitus without complication (HCC)   . Family history of adverse reaction to anesthesia   . No pertinent past medical history    Reviewed med, allergy, problem list.    Observations/Objective: No acute distress Normal breathing on video. No cough or wheezing.  Appeared a pale complexion.   .. Today's Vitals   05/19/20 1316  BP: 100/77  Pulse: (!) 116  Temp: 98.5 F (36.9 C)  TempSrc: Oral  Weight: 266 lb (120.7 kg)  Height: 6' (1.829 m)   Body mass index is 36.08 kg/m.  .. Results for orders placed or performed in visit on 05/19/20  Novel Coronavirus, NAA ([facility])   Specimen: Nasopharyngeal(NP) swabs in vial transport medium   Nasopharynge  Result Value Ref Range   SARS-CoV-2, NAA Not Detected Not Detected  SARS-COV-2, NAA 2 DAY TAT   Nasopharynge  Result Value Ref Range   SARS-CoV-2, NAA 2 DAY TAT Performed   POCT Influenza A/B  Result Value Ref Range   Influenza A, POC Negative Negative   Influenza B, POC Negative Negative     Assessment and Plan: Marland KitchenMarland KitchenArlington was seen today for muscle pain.  Diagnoses and all orders for this visit:  Body aches -     ibuprofen (ADVIL)  800 MG tablet; Take 1 tablet (800 mg total) by mouth every 8 (eight) hours as needed. -     Novel Coronavirus, NAA ([facility]) -     POCT Influenza A/B -     SARS-COV-2, NAA 2 DAY TAT  Chills -     ibuprofen (ADVIL) 800 MG tablet; Take 1 tablet (800 mg total) by mouth every 8 (eight) hours as needed. -     Novel Coronavirus, NAA ([facility]) -     POCT Influenza A/B -     SARS-COV-2, NAA 2 DAY TAT  Acute nonintractable headache, unspecified  headache type -     ibuprofen (ADVIL) 800 MG tablet; Take 1 tablet (800 mg total) by mouth every 8 (eight) hours as needed. -     Novel Coronavirus, NAA ([facility]) -     POCT Influenza A/B -     SARS-COV-2, NAA 2 DAY TAT  Tachycardia -     SARS-COV-2, NAA 2 DAY TAT   Pt has had covid twice and fully vaccinated but will test today with flu.  Likely viral etiology with suddenly feeling bad.  No sudden withdrawal from medication.  Discussed symptomatic care with ibuprofen, tylenol, rest, hydration.     Follow Up Instructions:    I discussed the assessment and treatment plan with the patient. The patient was provided an opportunity to ask questions and all were answered. The patient agreed with the plan and demonstrated an understanding of the instructions.   The patient was advised to call back or seek an in-person evaluation if the symptoms worsen or if the condition fails to improve as anticipated.    Tandy Gaw, PA-C

## 2020-05-19 NOTE — Progress Notes (Signed)
Negative for flu. We did test P[facility] covid just in case. Ibuprofen 800mg  alternate with tylenol 1000mg  every 4 to 6 hours. Lots of fluid. There is a GI virus going around so don't be surprised if you get some GI symptoms.

## 2020-05-21 LAB — NOVEL CORONAVIRUS, NAA: SARS-CoV-2, NAA: NOT DETECTED

## 2020-05-21 LAB — SARS-COV-2, NAA 2 DAY TAT

## 2020-05-21 NOTE — Progress Notes (Signed)
Negative for covid. How are you feeling?

## 2020-05-31 ENCOUNTER — Ambulatory Visit: Payer: 59 | Admitting: Psychology

## 2020-06-07 ENCOUNTER — Ambulatory Visit (INDEPENDENT_AMBULATORY_CARE_PROVIDER_SITE_OTHER): Payer: 59 | Admitting: Psychology

## 2020-06-07 DIAGNOSIS — F902 Attention-deficit hyperactivity disorder, combined type: Secondary | ICD-10-CM

## 2020-06-07 DIAGNOSIS — F331 Major depressive disorder, recurrent, moderate: Secondary | ICD-10-CM

## 2020-06-07 NOTE — Telephone Encounter (Signed)
Can you print attachment for me to view.

## 2020-06-23 ENCOUNTER — Encounter: Payer: Self-pay | Admitting: Physician Assistant

## 2020-06-23 ENCOUNTER — Telehealth (INDEPENDENT_AMBULATORY_CARE_PROVIDER_SITE_OTHER): Payer: 59 | Admitting: Physician Assistant

## 2020-06-23 VITALS — BP 130/82 | HR 91

## 2020-06-23 DIAGNOSIS — F411 Generalized anxiety disorder: Secondary | ICD-10-CM | POA: Diagnosis not present

## 2020-06-23 DIAGNOSIS — F902 Attention-deficit hyperactivity disorder, combined type: Secondary | ICD-10-CM

## 2020-06-23 DIAGNOSIS — F330 Major depressive disorder, recurrent, mild: Secondary | ICD-10-CM | POA: Diagnosis not present

## 2020-06-23 MED ORDER — AMPHETAMINE-DEXTROAMPHET ER 20 MG PO CP24
20.0000 mg | ORAL_CAPSULE | ORAL | 0 refills | Status: DC
Start: 2020-06-23 — End: 2020-07-19

## 2020-06-23 MED ORDER — SERTRALINE HCL 100 MG PO TABS: 1.0000 | ORAL_TABLET | Freq: Every day | ORAL | 1 refills | Status: DC

## 2020-06-23 MED ORDER — AMPHETAMINE-DEXTROAMPHET ER 20 MG PO CP24: 20.0000 mg | ORAL_CAPSULE | ORAL | 0 refills | Status: DC

## 2020-06-23 MED ORDER — AMPHETAMINE-DEXTROAMPHET ER 20 MG PO CP24: 20.0000 mg | ORAL_CAPSULE | Freq: Every day | ORAL | 0 refills | Status: DC

## 2020-06-23 NOTE — Progress Notes (Signed)
Patient ID: Kenneth Mcbride, male   DOB: 02-09-1986, 35 y.o.   MRN: [id number] .Marland KitchenVirtual Visit via Video Note  I connected with Jaquita Folds on 06/23/20 at  9:10 AM EDT by a video enabled telemedicine application and verified that I am speaking with the correct person using two identifiers.  Location: Patient: home Provider: clinic  .Marland KitchenParticipating in visit:  Patient: Kenneth Mcbride Provider: Tandy Gaw PA-C   I discussed the limitations of evaluation and management by telemedicine and the availability of in person appointments. The patient expressed understanding and agreed to proceed.  History of Present Illness: Pt presents to the clinic to discuss formal ADHD testing and further treatment plan.   .. Active Ambulatory Problems    Diagnosis Date Noted  . Closed fracture of proximal phalanx of third finger of left hand 11/28/2013  . Tendinitis of left hand 01/16/2014  . Lumbar spinal stenosis 05/20/2019  . Class 2 severe obesity due to excess calories with serious comorbidity and body mass index (BMI) of 36.0 to 36.9 in adult [facility]) 08/12/2019  . Diabetes mellitus type II, controlled (HCC) 08/12/2019  . Spinal stenosis 08/27/2019  . Injury of left ring finger 09/22/2019  . Left fourth distal phalangeal exostosis 09/22/2019  . Dyslipidemia 10/28/2019  . Anxiety 12/01/2019  . Elevated blood pressure reading without diagnosis of hypertension 12/26/2019  . Acute nonintractable headache 12/26/2019  . Dizziness 12/26/2019  . Depressed mood 01/14/2020  . Irritable 01/14/2020  . Non-restorative sleep 01/14/2020  . Snoring 01/14/2020  . Poor concentration 01/14/2020  . Change in consistency of stool 05/03/2020  . Skin tag 05/03/2020  . ADHD (attention deficit hyperactivity disorder), combined type 06/23/2020  . 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 (HCC) 06/23/2020  . GAD (generalized anxiety disorder) 06/23/2020   Resolved Ambulatory Problems    Diagnosis Date Noted  .  No Resolved Ambulatory Problems   Past Medical History:  Diagnosis Date  . Diabetes mellitus without complication (HCC)   . Family history of adverse reaction to anesthesia   . No pertinent past medical history        Observations/Objective: No acute distress Normal mood and appearance  .[name] Today's Vitals   06/23/20 0849  BP: 130/82  Pulse: 91   There is no height or weight on file to calculate BMI.    Assessment and Plan:  Marland KitchenMarland KitchenKenyatte was seen today for results.  Diagnoses and all orders for this visit:  ADHD (attention deficit hyperactivity disorder), combined type -     amphetamine-dextroamphetamine (ADDERALL XR) 20 MG 24 hr capsule; Take 1 capsule (20 mg total) by mouth daily. -     amphetamine-dextroamphetamine (ADDERALL XR) 20 MG 24 hr capsule; Take 1 capsule (20 mg total) by mouth every morning. -     amphetamine-dextroamphetamine (ADDERALL XR) 20 MG 24 hr capsule; Take 1 capsule (20 mg total) by mouth every morning.  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 (HCC) -     sertraline (ZOLOFT) 100 MG tablet; Take 1 tablet (100 mg total) by mouth daily.  GAD (generalized anxiety disorder) -     sertraline (ZOLOFT) 100 MG tablet; Take 1 tablet (100 mg total) by mouth daily.   Reviewed formal testing to include MDD with anxious distress and ADHD combined.  Started adderall XR 20mg  daily. Discussed side effects.  Continue zoloft.  Discussed more behavioral habits to help with focus/mood.  Follow up in 3 months.  Follow Up Instructions:    I discussed the assessment and treatment plan  with the patient. The patient was provided an opportunity to ask questions and all were answered. The patient agreed with the plan and demonstrated an understanding of the instructions.   The patient was advised to call back or seek an in-person evaluation if the symptoms worsen or if the condition fails to improve as anticipated.  Tandy Gaw, PA-C

## 2020-06-27 ENCOUNTER — Encounter: Payer: Self-pay | Admitting: Physician Assistant

## 2020-07-19 ENCOUNTER — Encounter: Payer: Self-pay | Admitting: Physician Assistant

## 2020-07-19 ENCOUNTER — Ambulatory Visit (INDEPENDENT_AMBULATORY_CARE_PROVIDER_SITE_OTHER): Payer: 59 | Admitting: Physician Assistant

## 2020-07-19 ENCOUNTER — Other Ambulatory Visit: Payer: Self-pay

## 2020-07-19 VITALS — BP 122/68 | HR 84 | Temp 98.5°F | Ht 72.0 in | Wt 262.0 lb

## 2020-07-19 DIAGNOSIS — F902 Attention-deficit hyperactivity disorder, combined type: Secondary | ICD-10-CM

## 2020-07-19 DIAGNOSIS — L723 Sebaceous cyst: Secondary | ICD-10-CM | POA: Diagnosis not present

## 2020-07-19 MED ORDER — AMPHETAMINE-DEXTROAMPHET ER 30 MG PO CP24: 30.0000 mg | ORAL_CAPSULE | ORAL | 0 refills | Status: DC

## 2020-07-19 MED ORDER — DOXYCYCLINE HYCLATE 100 MG PO TABS: 100.0000 mg | ORAL_TABLET | Freq: Two times a day (BID) | ORAL | 0 refills | Status: DC

## 2020-07-19 NOTE — Progress Notes (Signed)
Subjective:    Patient ID: Kenneth Mcbride, male    DOB: November 10, 1985, 35 y.o.   MRN: [id number]  HPI  Pt is a 35 yo male with ADHD, anxiety, depression who presents to the clinic to discuss bump on left chest wall.   Noticed a bump there for a while but just recently came to a head. He mashed it and a little white stuff and then blood came out. It seemed to get redder and more painful after that. Since has gotten a little better. Not tried anything else to treat. No fever, chills, nausea or vomiting.   He has been taking adderall 20mg  XR and not noticing a lot of improvement in focus and attention. Initially he was more anxious but that has improved. No problems with depression or sleep.   .. Active Ambulatory Problems    Diagnosis Date Noted  . Closed fracture of proximal phalanx of third finger of left hand 11/28/2013  . Tendinitis of left hand 01/16/2014  . Lumbar spinal stenosis 05/20/2019  . Class 2 severe obesity due to excess calories with serious comorbidity and body mass index (BMI) of 36.0 to 36.9 in adult [facility]) 08/12/2019  . Diabetes mellitus type II, controlled (HCC) 08/12/2019  . Spinal stenosis 08/27/2019  . Injury of left ring finger 09/22/2019  . Left fourth distal phalangeal exostosis 09/22/2019  . Dyslipidemia 10/28/2019  . Anxiety 12/01/2019  . Elevated blood pressure reading without diagnosis of hypertension 12/26/2019  . Acute nonintractable headache 12/26/2019  . Dizziness 12/26/2019  . Depressed mood 01/14/2020  . Irritable 01/14/2020  . Non-restorative sleep 01/14/2020  . Snoring 01/14/2020  . Poor concentration 01/14/2020  . Change in consistency of stool 05/03/2020  . Skin tag 05/03/2020  . ADHD (attention deficit hyperactivity disorder), combined type 06/23/2020  . 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 (HCC) 06/23/2020  . GAD (generalized anxiety disorder) 06/23/2020   Resolved Ambulatory Problems    Diagnosis Date Noted  . No Resolved  Ambulatory Problems   Past Medical History:  Diagnosis Date  . Diabetes mellitus without complication (HCC)   . Family history of adverse reaction to anesthesia   . No pertinent past medical history      Review of Systems See HPI.     Objective:   Physical Exam Vitals reviewed.  Constitutional:      Appearance: Normal appearance. He is obese.  HENT:     Head: Normocephalic.  Cardiovascular:     Rate and Rhythm: Normal rate and regular rhythm.     Pulses: Normal pulses.  Pulmonary:     Effort: Pulmonary effort is normal.  Skin:    Comments: Left chest wall indurated and erythematous area of about 3cm by 2cm with central punctate nodule. Not warm to touch.   Neurological:     General: No focal deficit present.     Mental Status: He is alert and oriented to person, place, and time.  Psychiatric:        Mood and Affect: Mood normal.           Assessment & Plan:  04/29/2022Marland KitchenDiagnoses and all orders for this visit:  Inflamed sebaceous cyst -     doxycycline (VIBRA-TABS) 100 MG tablet; Take 1 tablet (100 mg total) by mouth 2 (two) times daily.  ADHD (attention deficit hyperactivity disorder), combined type -     amphetamine-dextroamphetamine (ADDERALL XR) 30 MG 24 hr capsule; Take 1 capsule (30 mg total) by mouth every morning.   Started  doxycycline and warm compresses. May return if not resolved in next few weeks for removal. HO given. Consider topical witch hazel to keep surface area clean.   Called to stop adderall XR 20mg  and sent 30mg  XR to try for the next month and then follow up in 1 month.

## 2020-07-19 NOTE — Patient Instructions (Addendum)
If not improving come back for cyst removal.   Epidermoid Cyst  An epidermoid cyst, also called an epidermal cyst, is a small lump under your skin. The cyst contains a substance called keratin. Do not try to pop or open the cyst yourself. What are the causes?  A blocked hair follicle.  A hair that curls and re-enters the skin instead of growing straight out of the skin.  A blocked pore.  Irritated skin.  An injury to the skin.  Certain conditions that are passed along from parent to child.  Human papillomavirus (HPV). This happens rarely when cysts occur on the bottom of the feet.  Long-term sun damage to the skin. What increases the risk?  Having acne.  Being male.  Having an injury to the skin.  Being past puberty.  Having certain conditions caused by genes (genetic disorder) What are the signs or symptoms? These cysts are usually harmless, but they can get infected. Symptoms of infection may include:  Redness.  Inflammation.  Tenderness.  Warmth.  Fever.  A bad-smelling substance that drains from the cyst.  Pus that drains from the cyst. How is this treated? In many cases, epidermoid cysts go away on their own without treatment. If a cyst becomes infected, treatment may include:  Opening and draining the cyst, done by a doctor. After draining, you may need minor surgery to remove the rest of the cyst.  Antibiotic medicine.  Shots of medicines (steroids) that help to reduce inflammation.  Surgery to remove the cyst. Surgery may be done if the cyst: ? Becomes large. ? Bothers you. ? Has a chance of turning into cancer.  Do not try to open a cyst yourself. Follow these instructions at home: Medicines  Take over-the-counter and prescription medicines as told by your doctor.  If you were prescribed an antibiotic medicine, take it as told by your doctor. Do not stop taking it even if you start to feel better. General instructions  Keep the area  around your cyst clean and dry.  Wear loose, dry clothing.  Avoid touching your cyst.  Check your cyst every day for signs of infection. Check for: ? Redness, swelling, or pain. ? Fluid or blood. ? Warmth. ? Pus or a bad smell.  Keep all follow-up visits. How is this prevented?  Wear clean, dry, clothing.  Avoid wearing tight clothing.  Keep your skin clean and dry. Take showers or baths every day. Contact a doctor if:  Your cyst has symptoms of infection.  Your condition does not improve or gets worse.  You have a cyst that looks different from other cysts you have had.  You have a fever. Get help right away if:  Redness spreads from the cyst into the area close by. Summary  An epidermoid cyst is a small lump under your skin.  If a cyst becomes infected, treatment may include surgery to open and drain the cyst, or to remove it.  Take over-the-counter and prescription medicines only as told by your doctor.  Contact a doctor if your condition is not improving or is getting worse.  Keep all follow-up visits. This information is not intended to replace advice given to you by your health care provider. Make sure you discuss any questions you have with your health care provider. Document Revised: 05/21/2019 Document Reviewed: 05/21/2019 Elsevier Patient Education  2021 [employer].

## 2020-08-03 ENCOUNTER — Encounter: Payer: Self-pay | Admitting: Physician Assistant

## 2020-08-03 ENCOUNTER — Ambulatory Visit (INDEPENDENT_AMBULATORY_CARE_PROVIDER_SITE_OTHER): Payer: 59 | Admitting: Physician Assistant

## 2020-08-03 ENCOUNTER — Other Ambulatory Visit: Payer: Self-pay

## 2020-08-03 VITALS — BP 115/72 | HR 80 | Ht 72.0 in | Wt 264.0 lb

## 2020-08-03 DIAGNOSIS — E1169 Type 2 diabetes mellitus with other specified complication: Secondary | ICD-10-CM

## 2020-08-03 DIAGNOSIS — F330 Major depressive disorder, recurrent, mild: Secondary | ICD-10-CM

## 2020-08-03 DIAGNOSIS — F902 Attention-deficit hyperactivity disorder, combined type: Secondary | ICD-10-CM | POA: Diagnosis not present

## 2020-08-03 DIAGNOSIS — Z6835 Body mass index (BMI) 35.0-35.9, adult: Secondary | ICD-10-CM

## 2020-08-03 LAB — POCT GLYCOSYLATED HEMOGLOBIN (HGB A1C): Hemoglobin A1C: 5.4 % (ref 4.0–5.6)

## 2020-08-03 MED ORDER — LISDEXAMFETAMINE DIMESYLATE 50 MG PO CAPS: 50.0000 mg | ORAL_CAPSULE | Freq: Every day | ORAL | 0 refills | Status: DC

## 2020-08-03 MED ORDER — METFORMIN HCL 500 MG PO TABS: 500.0000 mg | ORAL_TABLET | Freq: Two times a day (BID) | ORAL | 1 refills | Status: DC

## 2020-08-03 NOTE — Progress Notes (Signed)
Subjective:    Patient ID: Kenneth Mcbride, male    DOB: 1985-11-17, 35 y.o.   MRN: [id number]  HPI  Patient is a 35 year old male with type 2 diabetes, ADHD, MDD who presents to the clinic for follow-up.  Patient is on metformin 500 mg twice a day.  This was decreased from his last visit when his A1c was 5.5.  He has done fairly well with his diet.  He has gained a few pounds and worried about his sugars actually going up.  He feels like his impulsive eating has improved some but he does admit to going and buying a bag of candy at the grocery store recently.  He did drink only water today.  He denies any open sores or wounds.  He denies any hypoglycemia.  He denies any chest pain, shortness of breath, headache or vision changes.  Patient has been on Adderall 30 mg for the last month.  He does not notice a drastic increase in focus.  He also feels like he is having excessive sweating and tingling of arms.  He almost feels like his fingertips are cold.  He is also on Zoloft for his mood.  He does feel like overall it is helping him with his anxiety and depression.  He denies any suicidal thoughts or homicidal idealizations.   .. Active Ambulatory Problems    Diagnosis Date Noted  . Closed fracture of proximal phalanx of third finger of left hand 11/28/2013  . Tendinitis of left hand 01/16/2014  . Lumbar spinal stenosis 05/20/2019  . Class 2 severe obesity due to excess calories with serious comorbidity and body mass index (BMI) of 35.0 to 35.9 in adult (HCC) 08/12/2019  . Diabetes mellitus type II, controlled (HCC) 08/12/2019  . Spinal stenosis 08/27/2019  . Injury of left ring finger 09/22/2019  . Left fourth distal phalangeal exostosis 09/22/2019  . Dyslipidemia 10/28/2019  . Anxiety 12/01/2019  . Elevated blood pressure reading without diagnosis of hypertension 12/26/2019  . Acute nonintractable headache 12/26/2019  . Dizziness 12/26/2019  . Depressed mood 01/14/2020  . Irritable  01/14/2020  . Non-restorative sleep 01/14/2020  . Snoring 01/14/2020  . Poor concentration 01/14/2020  . Change in consistency of stool 05/03/2020  . Skin tag 05/03/2020  . ADHD (attention deficit hyperactivity disorder), combined type 06/23/2020  . 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 (HCC) 06/23/2020  . GAD (generalized anxiety disorder) 06/23/2020   Resolved Ambulatory Problems    Diagnosis Date Noted  . No Resolved Ambulatory Problems   Past Medical History:  Diagnosis Date  . Diabetes mellitus without complication (HCC)   . Family history of adverse reaction to anesthesia   . No pertinent past medical history     Review of Systems  All other systems reviewed and are negative.      Objective:   Physical Exam Vitals reviewed.  Constitutional:      Appearance: Normal appearance. He is obese.  HENT:     Head: Normocephalic.  Cardiovascular:     Rate and Rhythm: Normal rate and regular rhythm.  Pulmonary:     Effort: Pulmonary effort is normal.     Breath sounds: Normal breath sounds.  Neurological:     General: No focal deficit present.     Mental Status: He is alert and oriented to person, place, and time.  Psychiatric:        Mood and Affect: Mood normal.        Behavior: Behavior normal.       .[name]  Results for orders placed or performed in visit on 08/03/20  POCT glycosylated hemoglobin (Hb A1C)  Result Value Ref Range   Hemoglobin A1C 5.4 4.0 - 5.6 %   HbA1c POC (<> result, manual entry)     HbA1c, POC (prediabetic range)     HbA1c, POC (controlled diabetic range)     .[name] Depression screen [facility] 2/9 02/02/2020 01/13/2020 11/26/2019 07/21/2019  Decreased Interest 1 3 0 3  Down, Depressed, Hopeless 0 1 0 1  PHQ - 2 Score 1 4 0 4  Altered sleeping 1 0 0 3  Tired, decreased energy 1 3 1 2   Change in appetite 0 2 1 0  Feeling bad or failure about yourself  1 3 0 1  Trouble concentrating 2 3 3 1   Moving slowly or fidgety/restless 0 0 0 0   Suicidal thoughts 0 1 0 0  PHQ-9 Score 6 16 5 11   Difficult doing work/chores - Very difficult Somewhat difficult Not difficult at all   . GAD 7 : Generalized Anxiety Score 02/02/2020 01/13/2020 11/26/2019 07/21/2019  Nervous, Anxious, on Edge 1 3 3  0  Control/stop worrying 1 3 3  0  Worry too much - different things 1 3 3 2   Trouble relaxing 2 3 3 3   Restless 2 3 1 3   Easily annoyed or irritable 1 3 3 1   Afraid - awful might happen 1 3 3 3   Total GAD 7 Score 9 21 19 12   Anxiety Difficulty - Very difficult Very difficult Not difficult at all        Assessment & Plan:  11/18/202110/1/2021Garik was seen today for skin problem and diabetes.  Diagnoses and all orders for this visit:  Controlled type 2 diabetes mellitus with other specified complication, without long-term current use of insulin (HCC) -     POCT glycosylated hemoglobin (Hb A1C) -     metFORMIN (GLUCOPHAGE) 500 MG tablet; Take 1 tablet (500 mg total) by mouth 2 (two) times daily with a meal.  ADHD (attention deficit hyperactivity disorder), combined type  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 (HCC)  Class 2 severe obesity due to excess calories with serious comorbidity and body mass index (BMI) of 35.0 to 35.9 in adult [facility])  Other orders -     lisdexamfetamine (VYVANSE) 50 MG capsule; Take 1 capsule (50 mg total) by mouth daily.   A1C to goal. Looks great. Went down even with the decrease in metformin.  Continue on metformin.  Continue with DM diet.  On ACE. BP to goal.  On statin.  Foot exam/eye exam UTD.  Covid/flu/pneumonia vaccine UTD.   Stop adderall. Start vyvanse to see if tolerates better. Follow up in 1 month.   GAD/PHQ-9 improving. On zoloft. ? If zoloft is causing the excessive sweating and not the stimulant. Hold on changing this for now.

## 2020-09-20 ENCOUNTER — Other Ambulatory Visit: Payer: Self-pay | Admitting: Neurology

## 2020-09-20 DIAGNOSIS — F330 Major depressive disorder, recurrent, mild: Secondary | ICD-10-CM

## 2020-09-20 DIAGNOSIS — F411 Generalized anxiety disorder: Secondary | ICD-10-CM

## 2020-10-04 ENCOUNTER — Encounter: Payer: Self-pay | Admitting: Physician Assistant

## 2020-10-05 ENCOUNTER — Other Ambulatory Visit: Payer: Self-pay | Admitting: Physician Assistant

## 2020-10-06 MED ORDER — LISDEXAMFETAMINE DIMESYLATE 50 MG PO CAPS: 50.0000 mg | ORAL_CAPSULE | Freq: Every day | ORAL | 0 refills | Status: DC

## 2020-10-06 NOTE — Telephone Encounter (Signed)
Pt has an additional refill at the pharmacy for the Metformin, which should be enough to last him until December.  Vyvanse was last prescribed on 08/03/2020.  Please review and refill if appropriate.  Pt has an appt with you on 11/02/2020.  Tiajuana Amass, CMA

## 2020-11-02 ENCOUNTER — Ambulatory Visit (INDEPENDENT_AMBULATORY_CARE_PROVIDER_SITE_OTHER): Payer: 59 | Admitting: Physician Assistant

## 2020-11-02 ENCOUNTER — Encounter: Payer: Self-pay | Admitting: Physician Assistant

## 2020-11-02 VITALS — BP 127/78 | HR 99 | Temp 98.2°F | Ht 72.0 in | Wt 264.0 lb

## 2020-11-02 DIAGNOSIS — Z1159 Encounter for screening for other viral diseases: Secondary | ICD-10-CM

## 2020-11-02 DIAGNOSIS — E785 Hyperlipidemia, unspecified: Secondary | ICD-10-CM

## 2020-11-02 DIAGNOSIS — Z114 Encounter for screening for human immunodeficiency virus [HIV]: Secondary | ICD-10-CM

## 2020-11-02 DIAGNOSIS — E1169 Type 2 diabetes mellitus with other specified complication: Secondary | ICD-10-CM

## 2020-11-02 DIAGNOSIS — F330 Major depressive disorder, recurrent, mild: Secondary | ICD-10-CM

## 2020-11-02 DIAGNOSIS — F902 Attention-deficit hyperactivity disorder, combined type: Secondary | ICD-10-CM | POA: Diagnosis not present

## 2020-11-02 DIAGNOSIS — F411 Generalized anxiety disorder: Secondary | ICD-10-CM

## 2020-11-02 DIAGNOSIS — R519 Headache, unspecified: Secondary | ICD-10-CM

## 2020-11-02 DIAGNOSIS — Z6835 Body mass index (BMI) 35.0-35.9, adult: Secondary | ICD-10-CM

## 2020-11-02 MED ORDER — LISINOPRIL 2.5 MG PO TABS: 2.5000 mg | ORAL_TABLET | Freq: Every day | ORAL | 1 refills | Status: DC

## 2020-11-02 MED ORDER — SUMATRIPTAN SUCCINATE 50 MG PO TABS: ORAL_TABLET | ORAL | 3 refills | Status: AC

## 2020-11-02 MED ORDER — METFORMIN HCL 500 MG PO TABS: 500.0000 mg | ORAL_TABLET | Freq: Two times a day (BID) | ORAL | 1 refills | Status: DC

## 2020-11-02 MED ORDER — ROSUVASTATIN CALCIUM 10 MG PO TABS: 10.0000 mg | ORAL_TABLET | Freq: Every day | ORAL | 3 refills | Status: DC

## 2020-11-02 MED ORDER — SERTRALINE HCL 100 MG PO TABS: 100.0000 mg | ORAL_TABLET | Freq: Every day | ORAL | 1 refills | Status: DC

## 2020-11-02 MED ORDER — LISDEXAMFETAMINE DIMESYLATE 50 MG PO CAPS: 50.0000 mg | ORAL_CAPSULE | Freq: Every day | ORAL | 0 refills | Status: DC

## 2020-11-02 NOTE — Progress Notes (Signed)
Subjective:    Patient ID: Kenneth Mcbride, male    DOB: 02/01/1986, 35 y.o.   MRN: [id number]  HPI Pt is a 35 yo male with T2DM, ADHD, MDD, GAD with hx of lumbar stenosis and lumbar decompression surgery 07/2019 who presents to the clinic for 3 month follow up.   He lost his insurance this month and has not had vyvanse, lisinopril, sumatriptan, crestor.   He did feel like the combination of medications was good but he was only on them briefly all together.   Not checking sugars. Taking metformin only. No hypoglycemic events. No open sores or wounds. Not exercising or watching diet.   .. Active Ambulatory Problems    Diagnosis Date Noted   Closed fracture of proximal phalanx of third finger of left hand 11/28/2013   Tendinitis of left hand 01/16/2014   Lumbar spinal stenosis 05/20/2019   Class 2 severe obesity due to excess calories with serious comorbidity and body mass index (BMI) of 35.0 to 35.9 in adult (HCC) 08/12/2019   Diabetes mellitus type II, controlled (HCC) 08/12/2019   Spinal stenosis 08/27/2019   Injury of left ring finger 09/22/2019   Left fourth distal phalangeal exostosis 09/22/2019   Dyslipidemia 10/28/2019   Anxiety 12/01/2019   Elevated blood pressure reading without diagnosis of hypertension 12/26/2019   Acute nonintractable headache 12/26/2019   Dizziness 12/26/2019   Depressed mood 01/14/2020   Irritable 01/14/2020   Non-restorative sleep 01/14/2020   Snoring 01/14/2020   Poor concentration 01/14/2020   Change in consistency of stool 05/03/2020   Skin tag 05/03/2020   ADHD (attention deficit hyperactivity disorder), combined type 06/23/2020   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 (HCC) 06/23/2020   GAD (generalized anxiety disorder) 06/23/2020   Resolved Ambulatory Problems    Diagnosis Date Noted   No Resolved Ambulatory Problems   Past Medical History:  Diagnosis Date   Diabetes mellitus without complication (HCC)    Family history  of adverse reaction to anesthesia    No pertinent past medical history        Review of Systems See HPI.    Objective:   Physical Exam Vitals reviewed.  Constitutional:      Appearance: Normal appearance. He is obese.  HENT:     Head: Normocephalic.  Cardiovascular:     Rate and Rhythm: Normal rate and regular rhythm.     Pulses: Normal pulses.     Heart sounds: Normal heart sounds.  Pulmonary:     Effort: Pulmonary effort is normal.     Breath sounds: Normal breath sounds.  Neurological:     General: No focal deficit present.     Mental Status: He is alert and oriented to person, place, and time.  Psychiatric:        Mood and Affect: Mood normal.          Assessment & Plan:  Marland KitchenMarland KitchenNehemias was seen today for diabetes, adhd, depression and obesity.  Diagnoses and all orders for this visit:  Controlled type 2 diabetes mellitus with other specified complication, without long-term current use of insulin (HCC) -     HgB A1c -     lisinopril (ZESTRIL) 2.5 MG tablet; Take 1 tablet (2.5 mg total) by mouth daily. -     metFORMIN (GLUCOPHAGE) 500 MG tablet; Take 1 tablet (500 mg total) by mouth 2 (two) times daily with a meal.  ADHD (attention deficit hyperactivity disorder), combined type -     lisdexamfetamine (VYVANSE) 50  MG capsule; Take 1 capsule (50 mg total) by mouth daily.  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 (HCC) -     sertraline (ZOLOFT) 100 MG tablet; Take 1 tablet (100 mg total) by mouth daily.  Class 2 severe obesity due to excess calories with serious comorbidity and body mass index (BMI) of 35.0 to 35.9 in adult (HCC) -     COMPLETE METABOLIC PANEL WITH GFR  Need for hepatitis C screening test -     Hepatitis C antibody  Screening for HIV (human immunodeficiency virus) -     HIV Antibody (routine testing w rflx)  Acute nonintractable headache, unspecified headache type -     SUMAtriptan (IMITREX) 50 MG tablet; Take 1 tab (50 mg) by mouth  for migraine headache. May repeat dose one time if headache has not subsided in 2 hours. Do not take more than 2 tabs (100 mg) in a 24 hour period.  Dyslipidemia -     rosuvastatin (CRESTOR) 10 MG tablet; Take 1 tablet (10 mg total) by mouth daily.  GAD (generalized anxiety disorder) -     sertraline (ZOLOFT) 100 MG tablet; Take 1 tablet (100 mg total) by mouth daily.   Too soon for A1C. Ordered with labs.  On metformin. Continue.  BP to goal. Cut lisinopril to 2.5mg  for kidney protection.  Go back on statin.  Declined covid vaccines.  Pnuemonia vaccine UTD.  Need for flu shot.   Refilled zoloft for mood.   Refilled vyvanse. Will see if affordable on this insurance. If not will consider cotempla at retail pharmacy.   Refilled Imitrex for migraine rescue.

## 2020-11-03 ENCOUNTER — Encounter: Payer: Self-pay | Admitting: Physician Assistant

## 2020-11-06 IMAGING — CR DG LUMBAR SPINE 2-3V
2 series · 2 of 2 positions shown · non-contrast
Comparison: MRI May 31, 2019.

CLINICAL DATA: Lumbar decompression.

EXAM:
LUMBAR SPINE - 2-3 VIEW

[lateral (1 of 2)]
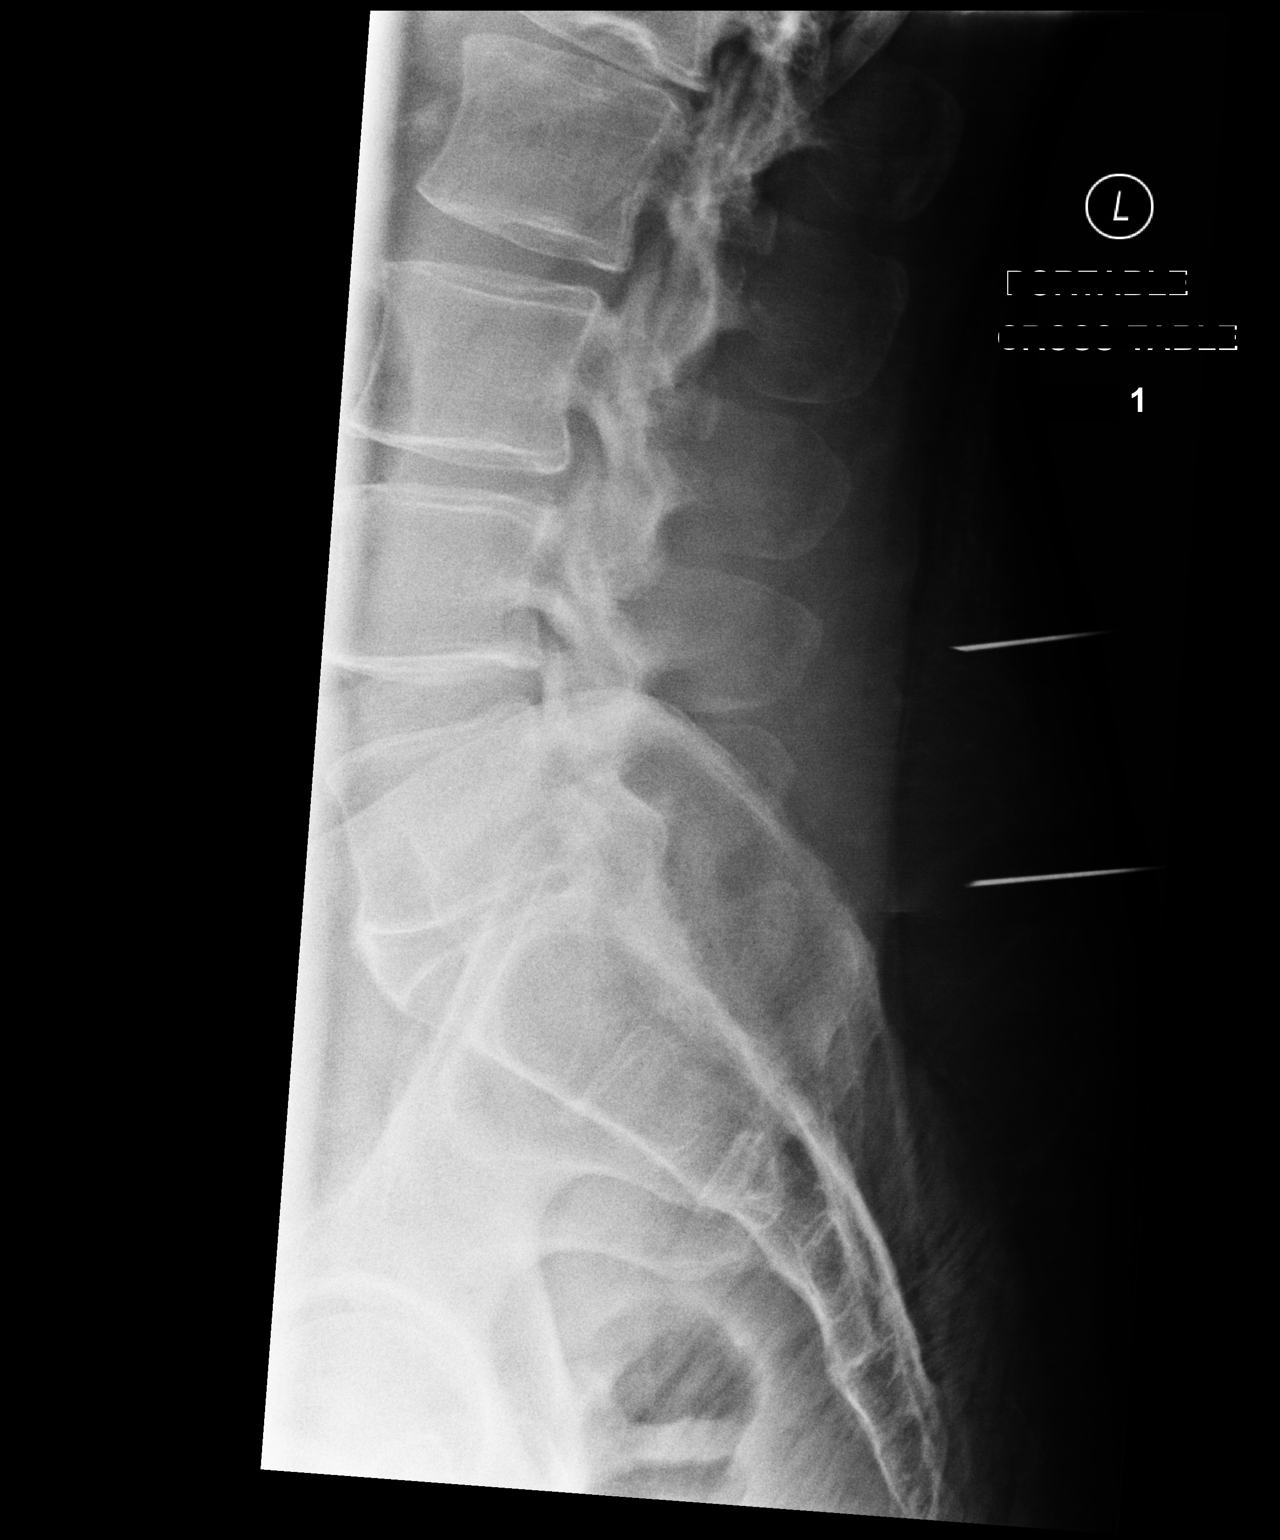

[lateral (2 of 2)]
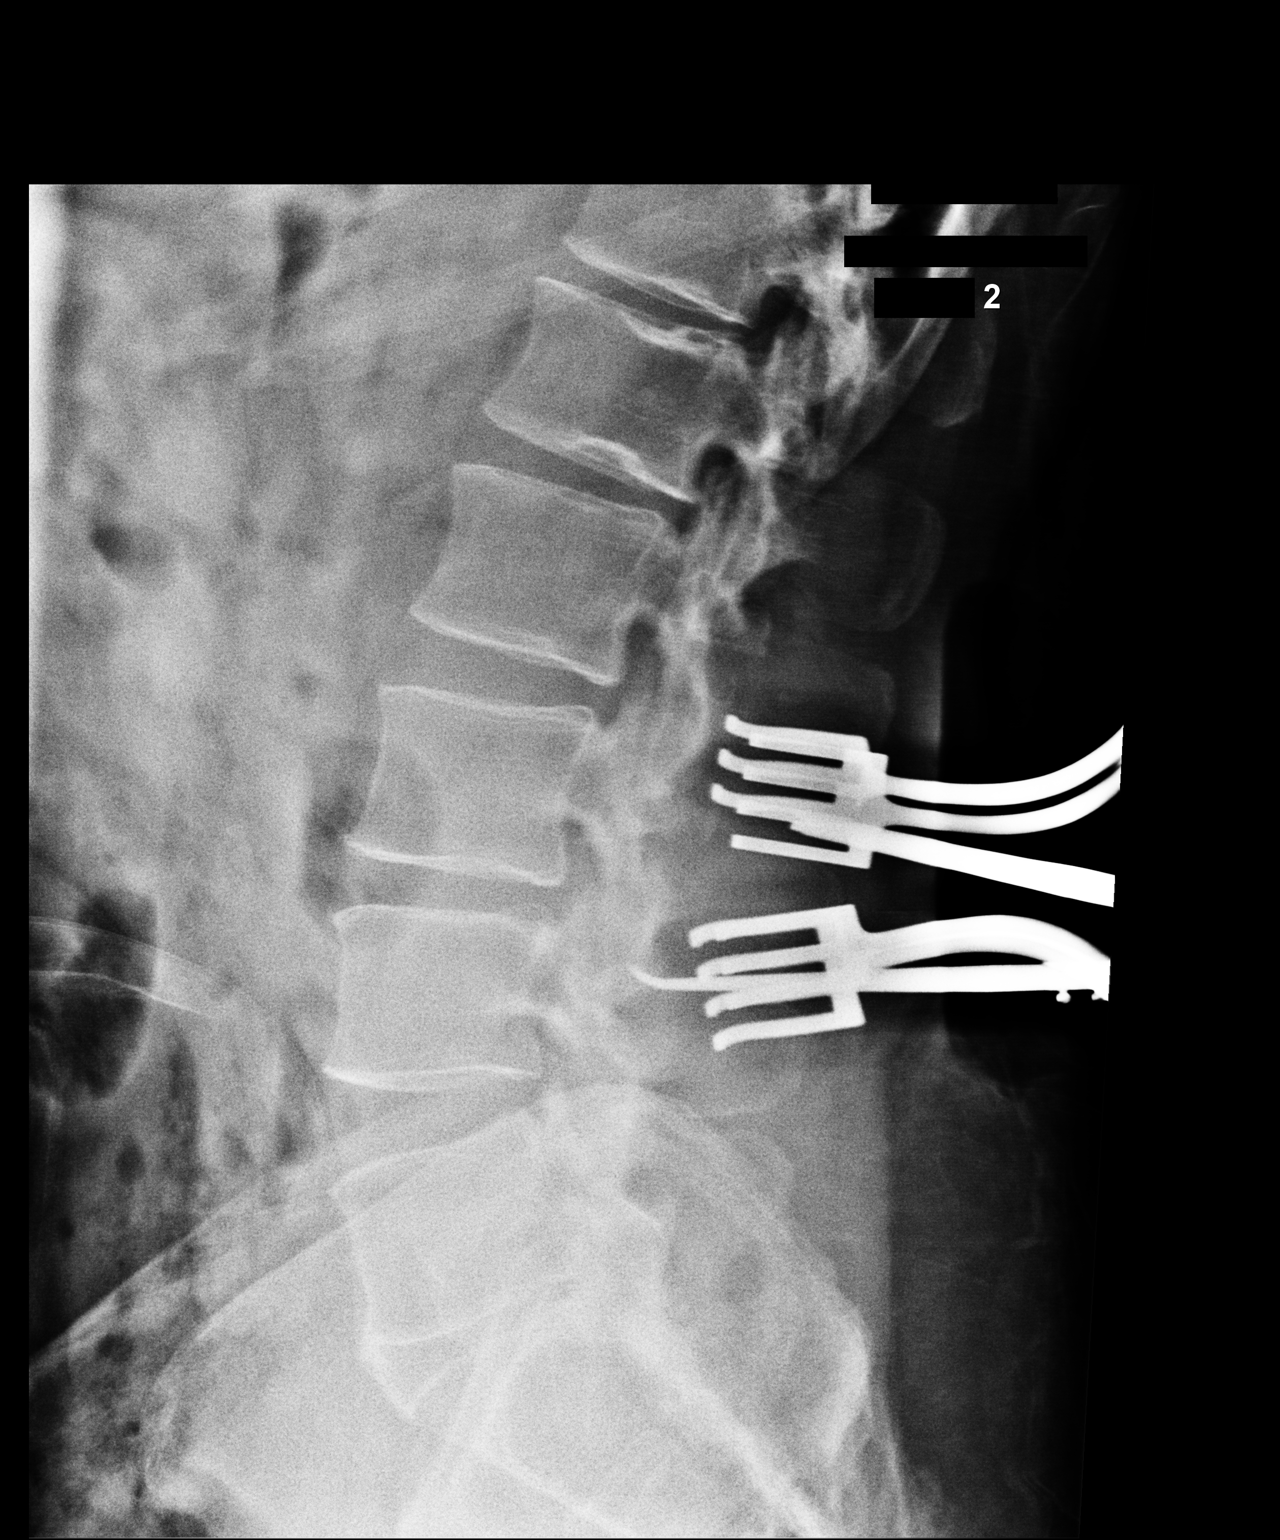

[2 of 2 positions shown; findings below may reference images not displayed]

FINDINGS: Two intraoperative cross-table lateral projections were obtained of
the lumbar spine. The first image demonstrates surgical probes
directed toward the posterior spinous process of L4 as well as the
posterior portions of S1. The second image demonstrates surgical
probes projected over the posterior elements of L3 and L4.
IMPRESSION: Surgical localization as described above.

## 2020-11-08 MED ORDER — COTEMPLA XR-ODT 17.3 MG PO TBED: 1.0000 | EXTENDED_RELEASE_TABLET | ORAL | 0 refills | Status: DC

## 2020-11-08 NOTE — Addendum Note (Signed)
Addended by: Jomarie Longs on: 11/08/2020 01:06 PM   Modules accepted: Orders

## 2020-11-10 ENCOUNTER — Other Ambulatory Visit: Payer: Self-pay

## 2020-11-10 DIAGNOSIS — F902 Attention-deficit hyperactivity disorder, combined type: Secondary | ICD-10-CM

## 2020-11-10 MED ORDER — LISDEXAMFETAMINE DIMESYLATE 50 MG PO CAPS: 50.0000 mg | ORAL_CAPSULE | Freq: Every day | ORAL | 0 refills | Status: DC

## 2020-11-30 ENCOUNTER — Encounter: Payer: Self-pay | Admitting: Physician Assistant

## 2020-11-30 LAB — HIV ANTIBODY (ROUTINE TESTING W REFLEX): HIV 1&2 Ab, 4th Generation: NONREACTIVE

## 2020-11-30 LAB — HEMOGLOBIN A1C
Hgb A1c MFr Bld: 5.5 % of total Hgb (ref <5.7)
Mean Plasma Glucose: 111 mg/dL
eAG (mmol/L): 6.2 mmol/L

## 2020-11-30 LAB — COMPLETE METABOLIC PANEL WITH GFR
AG Ratio: 2.1 (calc) (ref 1.0–2.5)
ALT: 27 U/L (ref 9–46)
AST: 21 U/L (ref 10–40)
Albumin: 4.4 g/dL (ref 3.6–5.1)
Alkaline phosphatase (APISO): 90 U/L (ref 36–130)
BUN: 22 mg/dL (ref 7–25)
CO2: 28 mmol/L (ref 20–32)
Calcium: 9.6 mg/dL (ref 8.6–10.3)
Chloride: 104 mmol/L (ref 98–110)
Creat: 1.03 mg/dL (ref 0.60–1.26)
Globulin: 2.1 g/dL (calc) (ref 1.9–3.7)
Glucose, Bld: 103 mg/dL — ABNORMAL HIGH (ref 65–99)
Potassium: 4.2 mmol/L (ref 3.5–5.3)
Sodium: 140 mmol/L (ref 135–146)
Total Bilirubin: 0.4 mg/dL (ref 0.2–1.2)
Total Protein: 6.5 g/dL (ref 6.1–8.1)
eGFR: 96 mL/min/{1.73_m2} (ref >=60)

## 2020-11-30 LAB — HEPATITIS C ANTIBODY
Hepatitis C Ab: NONREACTIVE
SIGNAL TO CUT-OFF: 0.01 (ref <1.00)

## 2020-11-30 NOTE — Progress Notes (Signed)
A1C is 5.5 and looks great. Mean glucose reading is 111.  Kidney and liver look great.

## 2020-12-02 IMAGING — DX DG FINGER RING 2+V*L*
3 series · 3 of 3 positions shown · non-contrast
Comparison: None.

CLINICAL DATA: Deformity fourth DIP joint

EXAM:
LEFT FOURTH FINGER 2+V

[finger ap]
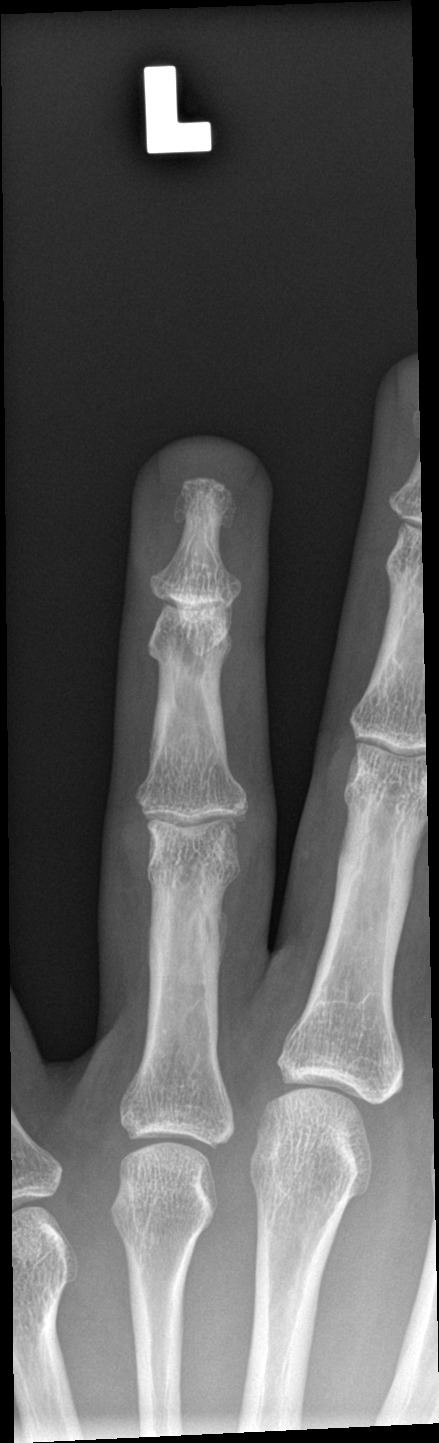

[finger obl]
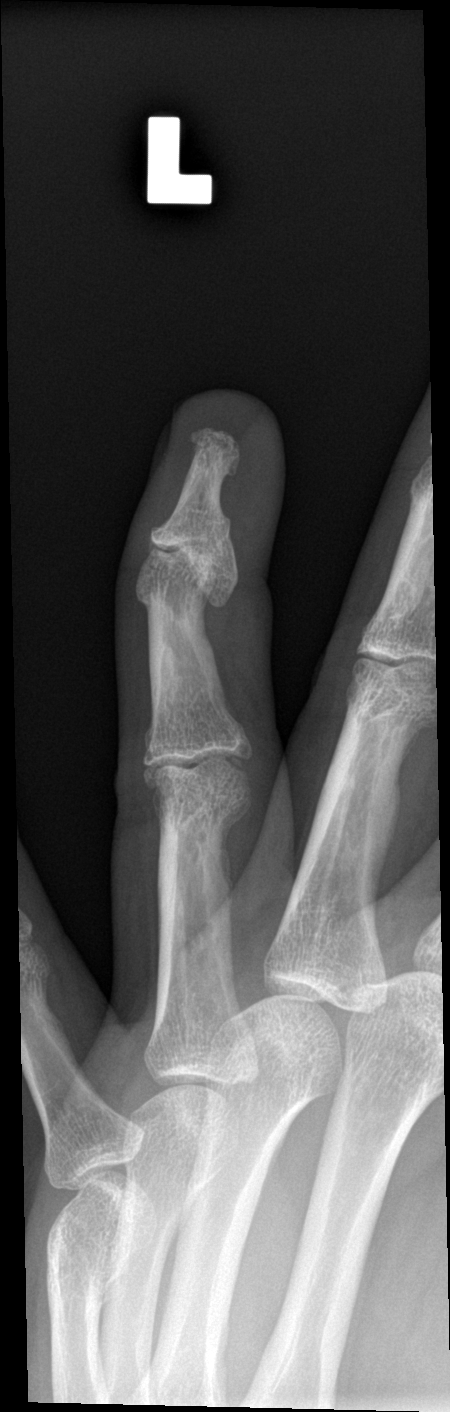

[finger lat]
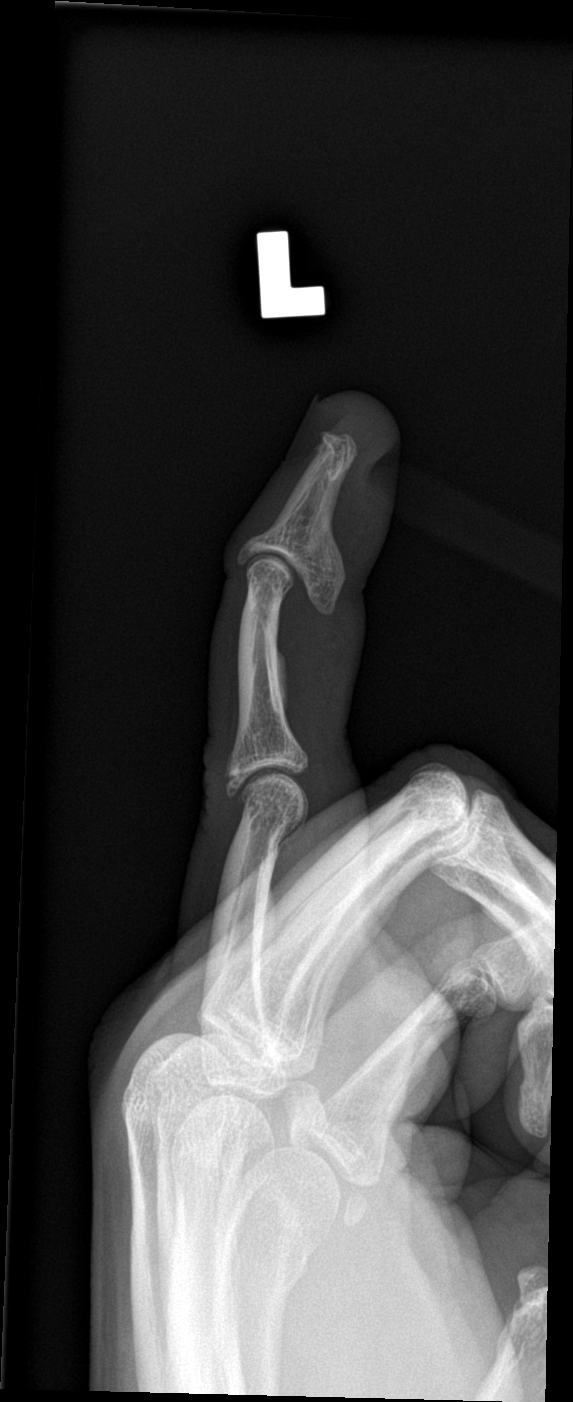

[3 of 3 positions shown; findings below may reference images not displayed]

FINDINGS: Frontal, oblique, and lateral views were obtained. There is evidence
of old trauma involving the distal most aspect of fourth distal
phalanx with remodeling. No acute fracture or dislocation. There is
marked bony overgrowth along the volar aspect of the proximal
portion of the fourth distal phalanx. There is no appreciable joint
space narrowing or erosion. There is milder bony overgrowth along
the dorsal aspect of the proximal most aspect of the fourth middle
phalanx.
IMPRESSION: Extensive bony overgrowth along the volar aspect of the proximal
most aspect of the fourth distal phalanx, benign in appearance. A
smaller area of bony overgrowth is noted along the dorsal aspect of
the proximal most aspect of the fourth middle phalanx. No
appreciable joint space narrowing or erosion. No acute fracture or
dislocation. Evidence of old trauma with remodeling involving the
distal aspect of the fourth distal phalanx noted.

## 2020-12-16 ENCOUNTER — Encounter: Payer: Self-pay | Admitting: Physician Assistant

## 2020-12-17 ENCOUNTER — Encounter: Payer: Self-pay | Admitting: Physician Assistant

## 2020-12-17 ENCOUNTER — Telehealth (INDEPENDENT_AMBULATORY_CARE_PROVIDER_SITE_OTHER): Payer: 59 | Admitting: Physician Assistant

## 2020-12-17 VITALS — Temp 97.0°F | Ht 72.0 in | Wt 264.0 lb

## 2020-12-17 DIAGNOSIS — J329 Chronic sinusitis, unspecified: Secondary | ICD-10-CM | POA: Diagnosis not present

## 2020-12-17 DIAGNOSIS — R051 Acute cough: Secondary | ICD-10-CM

## 2020-12-17 DIAGNOSIS — B9789 Other viral agents as the cause of diseases classified elsewhere: Secondary | ICD-10-CM

## 2020-12-17 MED ORDER — ALBUTEROL SULFATE HFA 108 (90 BASE) MCG/ACT IN AERS
2.0000 | INHALATION_SPRAY | Freq: Four times a day (QID) | RESPIRATORY_TRACT | 0 refills | Status: DC | PRN
Start: 2020-12-17 — End: 2022-06-19

## 2020-12-17 MED ORDER — METHYLPREDNISOLONE 4 MG PO TBPK: ORAL_TABLET | ORAL | 0 refills | Status: DC

## 2020-12-17 NOTE — Progress Notes (Signed)
..Virtual Visit via Video Note  I connected with Jaquita Folds on 12/20/20 at 11:30 AM EDT by a video enabled telemedicine application and verified that I am speaking with the correct person using two identifiers.  Location: Patient: home Provider: clinic  .Marland KitchenParticipating in visit:  Patient: Kevyn Provider: Tandy Gaw PA-C   I discussed the limitations of evaluation and management by telemedicine and the availability of in person appointments. The patient expressed understanding and agreed to proceed.  History of Present Illness: Pt is a 35 yo male who presents to the clinic with sinus pressure, cough, congestion, sinus drainage, ear popping. Hx of bronchitis. Pt "wants to catch it before goes to bronchitis". No fever, chills, body aches. Taking mucinex. Covid negative 3 times. No SOB.   [name]. Active Ambulatory Problems    Diagnosis Date Noted   Closed fracture of proximal phalanx of third finger of left hand 11/28/2013   Tendinitis of left hand 01/16/2014   Lumbar spinal stenosis 05/20/2019   Class 2 severe obesity due to excess calories with serious comorbidity and body mass index (BMI) of 35.0 to 35.9 in adult [facility]) 08/12/2019   Diabetes mellitus type II, controlled (HCC) 08/12/2019   Spinal stenosis 08/27/2019   Injury of left ring finger 09/22/2019   Left fourth distal phalangeal exostosis 09/22/2019   Dyslipidemia 10/28/2019   Anxiety 12/01/2019   Elevated blood pressure reading without diagnosis of hypertension 12/26/2019   Acute nonintractable headache 12/26/2019   Dizziness 12/26/2019   Depressed mood 01/14/2020   Irritable 01/14/2020   Non-restorative sleep 01/14/2020   Snoring 01/14/2020   Poor concentration 01/14/2020   Change in consistency of stool 05/03/2020   Skin tag 05/03/2020   ADHD (attention deficit hyperactivity disorder), combined type 06/23/2020   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 (HCC) 06/23/2020   GAD (generalized anxiety  disorder) 06/23/2020   Resolved Ambulatory Problems    Diagnosis Date Noted   No Resolved Ambulatory Problems   Past Medical History:  Diagnosis Date   Diabetes mellitus without complication (HCC)    Family history of adverse reaction to anesthesia    No pertinent past medical history        Observations/Objective: No acute distress Normal breathing No coughing or wheezing  .[name] Today's Vitals   12/17/20 1115  Temp: (!) 97 F (36.1 C)  TempSrc: Oral  Weight: 264 lb (119.7 kg)  Height: 6' (1.829 m)   Body mass index is 35.8 kg/m.    Assessment and Plan: Marland KitchenMarland KitchenTashawn was seen today for sinus problem.  Diagnoses and all orders for this visit:  Viral sinusitis -     albuterol (VENTOLIN HFA) 108 (90 Base) MCG/ACT inhaler; Inhale 2 puffs into the lungs every 6 (six) hours as needed. -     methylPREDNISolone (MEDROL DOSEPAK) 4 MG TBPK tablet; Take as directed by package insert.  Acute cough -     methylPREDNISolone (MEDROL DOSEPAK) 4 MG TBPK tablet; Take as directed by package insert.  Discussed likely virus.  Start medrol dose pack and albuterol as needed.  Rest and hydrate.  Continue mucinex.  Call if continuing to have symptoms. After 7-14 days would consider abx.    Follow Up Instructions:    I discussed the assessment and treatment plan with the patient. The patient was provided an opportunity to ask questions and all were answered. The patient agreed with the plan and demonstrated an understanding of the instructions.   The patient was advised to call back or seek  an in-person evaluation if the symptoms worsen or if the condition fails to improve as anticipated.    Tandy Gaw, PA-C

## 2020-12-17 NOTE — Progress Notes (Signed)
Started Wednesday evening Sinus pressure Runny/stuffy nose Sore throat (a little - from drainage) Cough (not bad)  Taking mucinex, nasal sprays  3 negative Covid tests

## 2020-12-20 MED ORDER — AZITHROMYCIN 250 MG PO TABS: ORAL_TABLET | ORAL | 0 refills | Status: DC

## 2021-01-13 ENCOUNTER — Emergency Department
Admission: EM | Admit: 2021-01-13 | Discharge: 2021-01-13 | Disposition: A | Discharge status: Home / Self Care | Payer: 59 | Source: Home / Self Care | Attending: Family Medicine | Admitting: Family Medicine

## 2021-01-13 ENCOUNTER — Other Ambulatory Visit: Payer: Self-pay

## 2021-01-13 ENCOUNTER — Encounter: Payer: Self-pay | Admitting: Physician Assistant

## 2021-01-13 DIAGNOSIS — S20212A Contusion of left front wall of thorax, initial encounter: Secondary | ICD-10-CM | POA: Diagnosis not present

## 2021-01-13 MED ORDER — IBUPROFEN 800 MG PO TABS: 800.0000 mg | ORAL_TABLET | Freq: Three times a day (TID) | ORAL | 0 refills | Status: DC

## 2021-01-13 NOTE — ED Provider Notes (Signed)
[facility]    CSN: [id number] Arrival date & time: 01/13/21  1440      History   Chief Complaint Chief Complaint  Patient presents with   Rib Injury    HPI Riyad Keena is a 35 y.o. male.   HPI Patient climbed behind his dryer in order to fix a belt.  In order to get out from behind the dryer he had to climb up over it.  During this process he bruised his ribs on the left anterior region.  He states has been bowling since then.  Full activities.  He is having increasing pain in his ribs.  Certain movements are painful.  He is able to take a deep breath.  No fever or chills  Past Medical History:  Diagnosis Date   Diabetes mellitus without complication (HCC)    Family history of adverse reaction to anesthesia    father and grand father both heart stopped during surgery   No pertinent past medical history    Spinal stenosis     Patient Active Problem List   Diagnosis Date Noted   ADHD (attention deficit hyperactivity disorder), combined type 06/23/2020   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 (HCC) 06/23/2020   GAD (generalized anxiety disorder) 06/23/2020   Change in consistency of stool 05/03/2020   Skin tag 05/03/2020   Depressed mood 01/14/2020   Irritable 01/14/2020   Non-restorative sleep 01/14/2020   Snoring 01/14/2020   Poor concentration 01/14/2020   Elevated blood pressure reading without diagnosis of hypertension 12/26/2019   Acute nonintractable headache 12/26/2019   Dizziness 12/26/2019   Anxiety 12/01/2019   Dyslipidemia 10/28/2019   Injury of left ring finger 09/22/2019   Left fourth distal phalangeal exostosis 09/22/2019   Spinal stenosis 08/27/2019   Class 2 severe obesity due to excess calories with serious comorbidity and body mass index (BMI) of 35.0 to 35.9 in adult [facility]) 08/12/2019   Diabetes mellitus type II, controlled (HCC) 08/12/2019   Lumbar spinal stenosis 05/20/2019   Tendinitis of left hand 01/16/2014     Past Surgical History:  Procedure Laterality Date   LUMBAR LAMINECTOMY/DECOMPRESSION MICRODISCECTOMY N/A 08/27/2019   Procedure: LUMBAR 2-LUMBAR 5 DECOMPRESSION;  Surgeon: Estill Bamberg, MD;  Location: [facility] OR;  Service: Orthopedics;  Laterality: N/A;   NO PAST SURGERIES         Home Medications    Prior to Admission medications   Medication Sig Start Date End Date Taking? Authorizing Provider  ibuprofen (ADVIL) 800 MG tablet Take 1 tablet (800 mg total) by mouth 3 (three) times daily. 01/13/21  Yes Eustace Moore, MD  albuterol (VENTOLIN HFA) 108 (90 Base) MCG/ACT inhaler Inhale 2 puffs into the lungs every 6 (six) hours as needed. 12/17/20   Breeback, Jade L, PA-C  AMBULATORY NON FORMULARY MEDICATION Glucometer lancets, strips to test blood sugar once a day for T2DM. 10/13/19   Breeback, Jade L, PA-C  GLUCOSAMINE-CHONDROITIN PO Take 1 tablet by mouth daily.    [provider]  lisdexamfetamine (VYVANSE) 50 MG capsule Take 1 capsule (50 mg total) by mouth daily. 11/10/20   Breeback, Jade L, PA-C  lisinopril (ZESTRIL) 2.5 MG tablet Take 1 tablet (2.5 mg total) by mouth daily. 11/02/20   Jomarie Longs, PA-C  metFORMIN (GLUCOPHAGE) 500 MG tablet Take 1 tablet (500 mg total) by mouth 2 (two) times daily with a meal. 11/02/20   Breeback, Jade L, PA-C  Methylphenidate (COTEMPLA XR-ODT) 17.3 MG TBED Take 1 tablet by  mouth every morning. 11/08/20   Breeback, Lonna Cobb, PA-C  Multiple Vitamin (MULTIVITAMIN WITH MINERALS) TABS tablet Take 1 tablet by mouth daily.    [provider]  rosuvastatin (CRESTOR) 10 MG tablet Take 1 tablet (10 mg total) by mouth daily. 11/02/20   Breeback, Jade L, PA-C  sertraline (ZOLOFT) 100 MG tablet Take 1 tablet (100 mg total) by mouth daily. 11/02/20   Breeback, Lonna Cobb, PA-C  SUMAtriptan (IMITREX) 50 MG tablet Take 1 tab (50 mg) by mouth for migraine headache. May repeat dose one time if headache has not subsided in 2 hours. Do not take more than 2 tabs  (100 mg) in a 24 hour period. 11/02/20   Jomarie Longs, PA-C    Family History Family History  Problem Relation Age of Onset   Diabetes Father    Heart disease Father    Diabetes Paternal Grandfather    COPD Paternal Grandfather     Social History Social History   Tobacco Use   Smoking status: Never   Smokeless tobacco: Never  Vaping Use   Vaping Use: Never used  Substance Use Topics   Alcohol use: Yes    Comment: ocassionally   Drug use: Never     Allergies   Buspar [buspirone]   Review of Systems Review of Systems See HPI  Physical Exam Triage Vital Signs ED Triage Vitals  Enc Vitals Group     BP 01/13/21 1517 121/79     Pulse Rate 01/13/21 1517 80     Resp 01/13/21 1517 18     Temp 01/13/21 1517 98.9 F (37.2 C)     Temp Source 01/13/21 1517 Oral     SpO2 01/13/21 1517 100 %     Weight --      Height --      Head Circumference --      Peak Flow --      Pain Score 01/13/21 1518 3     Pain Loc --      Pain Edu? --      Excl. in GC? --    No data found.  Updated Vital Signs BP 121/79 (BP Location: Right Arm)   Pulse 80   Temp 98.9 F (37.2 C) (Oral)   Resp 18   SpO2 100%       Physical Exam Constitutional:      General: He is not in acute distress.    Appearance: He is well-developed. He is not ill-appearing.  HENT:     Head: Normocephalic and atraumatic.     Mouth/Throat:     Comments: Mask is in place Eyes:     Conjunctiva/sclera: Conjunctivae normal.     Pupils: Pupils are equal, round, and reactive to light.  Cardiovascular:     Rate and Rhythm: Normal rate.     Heart sounds: Normal heart sounds.  Pulmonary:     Effort: Pulmonary effort is normal. No respiratory distress.     Breath sounds: Normal breath sounds.     Comments: There is tenderness at the lower rib border on the left at the anterior axillary line.  No bruising.  No crepitus.  No deformity. Chest:     Chest wall: Tenderness present.  Abdominal:     General:  There is no distension.     Palpations: Abdomen is soft.  Musculoskeletal:        General: Normal range of motion.     Cervical back: Normal range of motion.  Skin:  General: Skin is warm and dry.  Neurological:     Mental Status: He is alert.     UC Treatments / Results  Labs (all labs ordered are listed, but only abnormal results are displayed) Labs Reviewed - No data to display  EKG   Radiology No results found.  Procedures Procedures (including critical care time)  Medications Ordered in UC Medications - No data to display  Initial Impression / Assessment and Plan / UC Course  I have reviewed the triage vital signs and the nursing notes.  Pertinent labs & imaging results that were available during my care of the patient were reviewed by me and considered in my medical decision making (see chart for details).     The history and physical examination supports bruised ribs.  Costochondritis.  We will treat with ibuprofen and rest.  See PCP if fails to improve.  Do not see medical indication for x-rays Final Clinical Impressions(s) / UC Diagnoses   Final diagnoses:  Chest wall contusion, left, initial encounter     Discharge Instructions      Take ibuprofen 3 times a day with food Use ice or heat to painful rib area Make careful with movements until pain improves Call your doctor if not improving by Monday   ED Prescriptions     Medication Sig Dispense Auth. Provider   ibuprofen (ADVIL) 800 MG tablet Take 1 tablet (800 mg total) by mouth 3 (three) times daily. 21 tablet Eustace Moore, MD      PDMP not reviewed this encounter.   Eustace Moore, MD 01/13/21 Ebony Cargo

## 2021-01-13 NOTE — Discharge Instructions (Addendum)
Take ibuprofen 3 times a day with food Use ice or heat to painful rib area Make careful with movements until pain improves Call your doctor if not improving by Monday

## 2021-01-13 NOTE — ED Triage Notes (Signed)
Pt present left side rib injury on Monday. Pt states he was climbing from a dryer and injured his left side rib. Pt states that is a dull sharp pain.

## 2021-03-05 ENCOUNTER — Other Ambulatory Visit: Payer: Self-pay | Admitting: Physician Assistant

## 2021-03-05 DIAGNOSIS — E1169 Type 2 diabetes mellitus with other specified complication: Secondary | ICD-10-CM

## 2021-03-30 ENCOUNTER — Encounter: Payer: Self-pay | Admitting: Physician Assistant

## 2021-04-26 ENCOUNTER — Other Ambulatory Visit: Payer: Self-pay | Admitting: Physician Assistant

## 2021-04-26 DIAGNOSIS — F411 Generalized anxiety disorder: Secondary | ICD-10-CM

## 2021-04-26 DIAGNOSIS — F330 Major depressive disorder, recurrent, mild: Secondary | ICD-10-CM

## 2021-04-26 DIAGNOSIS — E1169 Type 2 diabetes mellitus with other specified complication: Secondary | ICD-10-CM

## 2021-06-17 ENCOUNTER — Encounter: Payer: Self-pay | Admitting: Physician Assistant

## 2021-06-17 ENCOUNTER — Other Ambulatory Visit: Payer: Self-pay | Admitting: Physician Assistant

## 2021-06-22 ENCOUNTER — Telehealth (INDEPENDENT_AMBULATORY_CARE_PROVIDER_SITE_OTHER): Payer: BC Managed Care – PPO | Admitting: Physician Assistant

## 2021-06-22 ENCOUNTER — Encounter: Payer: Self-pay | Admitting: Physician Assistant

## 2021-06-22 DIAGNOSIS — J301 Allergic rhinitis due to pollen: Secondary | ICD-10-CM

## 2021-06-22 DIAGNOSIS — J309 Allergic rhinitis, unspecified: Secondary | ICD-10-CM | POA: Diagnosis not present

## 2021-06-22 DIAGNOSIS — R519 Headache, unspecified: Secondary | ICD-10-CM

## 2021-06-22 MED ORDER — AZITHROMYCIN 250 MG PO TABS: ORAL_TABLET | ORAL | 0 refills | Status: AC

## 2021-06-22 MED ORDER — FLUTICASONE PROPIONATE 50 MCG/ACT NA SUSP: 2.0000 | Freq: Every day | NASAL | 6 refills | Status: AC

## 2021-06-22 MED ORDER — PREDNISONE 50 MG PO TABS: ORAL_TABLET | ORAL | 0 refills | Status: DC

## 2021-06-22 MED ORDER — LEVOCETIRIZINE DIHYDROCHLORIDE 5 MG PO TABS: 5.0000 mg | ORAL_TABLET | Freq: Every evening | ORAL | 1 refills | Status: DC

## 2021-06-22 NOTE — Progress Notes (Signed)
Pt reports that his sxs began on Monday evening, he has been taking sinus meds, hasn't taken a COVID test. Green color to mucus.  ? ?His last temp was 100.3 last night. Hasn't taken another temp since ?

## 2021-06-22 NOTE — Progress Notes (Signed)
..Virtual Visit via Video Note ? ?I connected with Kenneth Mcbride on 06/22/21 at  2:00 PM EDT by a video enabled telemedicine application and verified that I am speaking with the correct person using two identifiers. ? ?Location: ?Patient: home ?Provider: clinic ? ?[name].Participating in visit:  ?Patient: Kenneth Mcbride ?Provider: Iran Planas PA-C ?  ?I discussed the limitations of evaluation and management by telemedicine and the availability of in person appointments. The patient expressed understanding and agreed to proceed. ? ?History of Present Illness: ?Pt is a 36 yo male who has a hx of having allergies just in the spring and fall who started having sinus pressure, congestion, headache that started Monday. He has had a low grade fever. He is taking OtC mucinex and sinus medication with some benefit. No SOB or body aches. Ears are popping. He has hx of this at least 3 times a year and easily goes into his chest. He is not on allergy medication. Some of his sinus drainage is green. Home covid is negative. No sick exposures.  ? ? .. ?Active Ambulatory Problems  ?  Diagnosis Date Noted  ? Tendinitis of left hand 01/16/2014  ? Lumbar spinal stenosis 05/20/2019  ? Class 2 severe obesity due to excess calories with serious comorbidity and body mass index (BMI) of 35.0 to 35.9 in adult [facility]) 08/12/2019  ? Diabetes mellitus type II, controlled ([place]) 08/12/2019  ? Spinal stenosis 08/27/2019  ? Injury of left ring finger 09/22/2019  ? Left fourth distal phalangeal exostosis 09/22/2019  ? Dyslipidemia 10/28/2019  ? Anxiety 12/01/2019  ? Elevated blood pressure reading without diagnosis of hypertension 12/26/2019  ? Acute nonintractable headache 12/26/2019  ? Dizziness 12/26/2019  ? Depressed mood 01/14/2020  ? Irritable 01/14/2020  ? Non-restorative sleep 01/14/2020  ? Snoring 01/14/2020  ? Poor concentration 01/14/2020  ? Change in consistency of stool 05/03/2020  ? Skin tag 05/03/2020  ? ADHD (attention deficit hyperactivity  disorder), combined type 06/23/2020  ? 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 ([place]) 06/23/2020  ? GAD (generalized anxiety disorder) 06/23/2020  ? Sinus headache 06/22/2021  ? Allergic sinusitis 06/22/2021  ? Seasonal allergic rhinitis due to pollen 06/22/2021  ? ?Resolved Ambulatory Problems  ?  Diagnosis Date Noted  ? Closed fracture of proximal phalanx of third finger of left hand 11/28/2013  ? ?Past Medical History:  ?Diagnosis Date  ? Diabetes mellitus without complication ([place])   ? Family history of adverse reaction to anesthesia   ? No pertinent past medical history   ? ? ?Observations/Objective: ?No acute distress ?Normal mood and appearance ?No labored breathing ?Dry cough ? ? ?[name].There were no vitals filed for this visit. ?There is no height or weight on file to calculate BMI. ? ? ?Assessment and Plan: ?..Kenneth Mcbride was seen today for sinusitis and fever. ? ?Diagnoses and all orders for this visit: ? ?Allergic sinusitis ?-     predniSONE (DELTASONE) 50 MG tablet; Take one tablet for 5 days. ?-     fluticasone (FLONASE) 50 MCG/ACT nasal spray; Place 2 sprays into both nostrils daily. ?-     azithromycin (ZITHROMAX) 250 MG tablet; Take 2 tablets on day 1, then 1 tablet daily on days 2 through 5 ? ?Sinus headache ?-     predniSONE (DELTASONE) 50 MG tablet; Take one tablet for 5 days. ?-     fluticasone (FLONASE) 50 MCG/ACT nasal spray; Place 2 sprays into both nostrils daily. ?-     azithromycin (ZITHROMAX) 250 MG tablet; Take  2 tablets on day 1, then 1 tablet daily on days 2 through 5 ? ?Seasonal allergic rhinitis due to pollen ?-     levocetirizine (XYZAL) 5 MG tablet; Take 1 tablet (5 mg total) by mouth every evening. ? ? ?I suspect allergy induced sinusitis ?Covid negative.  ?Start xzyal daily during allergy season ?Prednisone burst given today with flonase to use daily as needed ?Zpak only if in 24 to 48 hours symptoms persist ?Rest and hydrate ?Follow up as needed or if symptoms persist.   ? ? ?Follow Up Instructions: ? ?  ?I discussed the assessment and treatment plan with the patient. The patient was provided an opportunity to ask questions and all were answered. The patient agreed with the plan and demonstrated an understanding of the instructions. ?  ?The patient was advised to call back or seek an in-person evaluation if the symptoms worsen or if the condition fails to improve as anticipated. ? ? ?Iran Planas, PA-C ? ?

## 2021-07-11 ENCOUNTER — Other Ambulatory Visit: Payer: Self-pay

## 2021-07-11 ENCOUNTER — Emergency Department (HOSPITAL_BASED_OUTPATIENT_CLINIC_OR_DEPARTMENT_OTHER): Payer: BC Managed Care – PPO

## 2021-07-11 ENCOUNTER — Emergency Department (HOSPITAL_BASED_OUTPATIENT_CLINIC_OR_DEPARTMENT_OTHER)
Admission: EM | Admit: 2021-07-11 | Discharge: 2021-07-11 | Disposition: A | Discharge status: Home / Self Care | Payer: BC Managed Care – PPO | Attending: Emergency Medicine | Admitting: Emergency Medicine

## 2021-07-11 ENCOUNTER — Encounter (HOSPITAL_BASED_OUTPATIENT_CLINIC_OR_DEPARTMENT_OTHER): Payer: Self-pay

## 2021-07-11 DIAGNOSIS — E119 Type 2 diabetes mellitus without complications: Secondary | ICD-10-CM | POA: Diagnosis not present

## 2021-07-11 DIAGNOSIS — Y92410 Unspecified street and highway as the place of occurrence of the external cause: Secondary | ICD-10-CM | POA: Diagnosis not present

## 2021-07-11 DIAGNOSIS — M25512 Pain in left shoulder: Secondary | ICD-10-CM | POA: Diagnosis not present

## 2021-07-11 DIAGNOSIS — I10 Essential (primary) hypertension: Secondary | ICD-10-CM | POA: Insufficient documentation

## 2021-07-11 DIAGNOSIS — Z7984 Long term (current) use of oral hypoglycemic drugs: Secondary | ICD-10-CM | POA: Diagnosis not present

## 2021-07-11 DIAGNOSIS — M542 Cervicalgia: Secondary | ICD-10-CM | POA: Diagnosis not present

## 2021-07-11 DIAGNOSIS — S3992XA Unspecified injury of lower back, initial encounter: Secondary | ICD-10-CM | POA: Diagnosis not present

## 2021-07-11 DIAGNOSIS — Z79899 Other long term (current) drug therapy: Secondary | ICD-10-CM | POA: Insufficient documentation

## 2021-07-11 DIAGNOSIS — S199XXA Unspecified injury of neck, initial encounter: Secondary | ICD-10-CM | POA: Diagnosis not present

## 2021-07-11 DIAGNOSIS — M545 Low back pain, unspecified: Secondary | ICD-10-CM | POA: Diagnosis not present

## 2021-07-11 HISTORY — DX: Essential (primary) hypertension: I10

## 2021-07-11 MED ORDER — KETOROLAC TROMETHAMINE 30 MG/ML IJ SOLN
15.0000 mg | Freq: Once | INTRAMUSCULAR | Status: AC
  Administered 2021-07-11: 15 mg via INTRAMUSCULAR
  Filled 2021-07-11: qty 1

## 2021-07-11 MED ORDER — CYCLOBENZAPRINE HCL 10 MG PO TABS
10.0000 mg | ORAL_TABLET | Freq: Once | ORAL | Status: AC
  Administered 2021-07-11: 10 mg via ORAL
  Filled 2021-07-11: qty 1
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
## 2021-07-11 NOTE — ED Notes (Signed)
Patient transported to X-ray 
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
## 2021-07-12 ENCOUNTER — Telehealth: Payer: Self-pay | Admitting: General Practice
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
## 2021-07-14 ENCOUNTER — Encounter: Payer: Self-pay | Admitting: Physician Assistant

## 2021-07-14 ENCOUNTER — Ambulatory Visit (INDEPENDENT_AMBULATORY_CARE_PROVIDER_SITE_OTHER): Payer: BC Managed Care – PPO | Admitting: Physician Assistant

## 2021-07-14 ENCOUNTER — Ambulatory Visit
Payer: BC Managed Care – PPO | Attending: Physician Assistant | Admitting: Rehabilitative and Restorative Service Providers"

## 2021-07-14 ENCOUNTER — Encounter: Payer: Self-pay | Admitting: Rehabilitative and Restorative Service Providers"

## 2021-07-14 DIAGNOSIS — M542 Cervicalgia: Secondary | ICD-10-CM

## 2021-07-14 DIAGNOSIS — F330 Major depressive disorder, recurrent, mild: Secondary | ICD-10-CM

## 2021-07-14 DIAGNOSIS — F411 Generalized anxiety disorder: Secondary | ICD-10-CM

## 2021-07-14 DIAGNOSIS — E1169 Type 2 diabetes mellitus with other specified complication: Secondary | ICD-10-CM

## 2021-07-14 DIAGNOSIS — M5416 Radiculopathy, lumbar region: Secondary | ICD-10-CM | POA: Insufficient documentation

## 2021-07-14 DIAGNOSIS — M5441 Lumbago with sciatica, right side: Secondary | ICD-10-CM

## 2021-07-14 DIAGNOSIS — R29898 Other symptoms and signs involving the musculoskeletal system: Secondary | ICD-10-CM

## 2021-07-14 DIAGNOSIS — M4807 Spinal stenosis, lumbosacral region: Secondary | ICD-10-CM | POA: Insufficient documentation

## 2021-07-14 DIAGNOSIS — E785 Hyperlipidemia, unspecified: Secondary | ICD-10-CM

## 2021-07-14 MED ORDER — PREDNISONE 50 MG PO TABS
ORAL_TABLET | ORAL | 0 refills | Status: DC
Start: 2021-07-14 — End: 2022-02-07

## 2021-07-14 MED ORDER — LISINOPRIL 2.5 MG PO TABS: 2.5000 mg | ORAL_TABLET | Freq: Every day | ORAL | 1 refills | Status: DC

## 2021-07-14 MED ORDER — METFORMIN HCL 500 MG PO TABS: 500.0000 mg | ORAL_TABLET | Freq: Two times a day (BID) | ORAL | 3 refills | Status: DC

## 2021-07-14 MED ORDER — SERTRALINE HCL 100 MG PO TABS: 100.0000 mg | ORAL_TABLET | Freq: Every day | ORAL | 3 refills | Status: DC

## 2021-07-14 MED ORDER — CYCLOBENZAPRINE HCL 10 MG PO TABS
10.0000 mg | ORAL_TABLET | Freq: Three times a day (TID) | ORAL | 0 refills | Status: DC | PRN
Start: 2021-07-14 — End: 2021-10-04

## 2021-07-14 MED ORDER — TRAMADOL HCL 50 MG PO TABS: 50.0000 mg | ORAL_TABLET | Freq: Three times a day (TID) | ORAL | 0 refills | Status: DC | PRN
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
## 2021-07-14 NOTE — Patient Instructions (Signed)
Start prednisone/flexeril Will get in with PT Tramadol as needed

## 2021-07-14 NOTE — Patient Instructions (Signed)
Access Code: [email or web address] URL: [email or web address] Date: 07/14/2021 Prepared by: Corlis Leak  Exercises - Prone Press Up  - 2 x daily - 7 x weekly - 1 sets - 10 reps - 2-3 sec  hold - Standing Lumbar Extension  - 2 x daily - 7 x weekly - 1 sets - 2-3 reps - 2-3 sec  hold - Seated Hip Flexor Stretch  - 2 x daily - 7 x weekly - 1 sets - 3 reps - 30 sec  hold - Supine Cervical Retraction with Towel  - 2 x daily - 7 x weekly - 1 sets - 5-10 reps - 10 sec  hold - Seated Cervical Retraction  - 2 x daily - 7 x weekly - 1-2 sets - 5-10 reps - 10 sec  hold - Supine Transversus Abdominis Bracing with Pelvic Floor Contraction  - 2 x daily - 7 x weekly - 1 sets - 10 reps - 10sec  hold - Walking  - 2-3 x daily - 7 x weekly  Patient Education - Hospital doctor - Office Posture - TENS Unit

## 2021-07-18 ENCOUNTER — Inpatient Hospital Stay: Payer: BC Managed Care – PPO | Admitting: Physician Assistant

## 2021-07-18 ENCOUNTER — Encounter: Payer: Self-pay | Admitting: Physician Assistant

## 2021-07-27 ENCOUNTER — Encounter: Payer: Self-pay | Admitting: Rehabilitative and Restorative Service Providers"

## 2021-07-27 ENCOUNTER — Ambulatory Visit: Payer: BC Managed Care – PPO | Admitting: Rehabilitative and Restorative Service Providers"

## 2021-07-27 DIAGNOSIS — R29898 Other symptoms and signs involving the musculoskeletal system: Secondary | ICD-10-CM

## 2021-07-27 DIAGNOSIS — M542 Cervicalgia: Secondary | ICD-10-CM

## 2021-07-27 DIAGNOSIS — M5416 Radiculopathy, lumbar region: Secondary | ICD-10-CM

## 2021-07-27 DIAGNOSIS — M4807 Spinal stenosis, lumbosacral region: Secondary | ICD-10-CM | POA: Diagnosis not present

## 2021-07-27 DIAGNOSIS — M5441 Lumbago with sciatica, right side: Secondary | ICD-10-CM | POA: Diagnosis not present

## 2021-07-27 NOTE — Therapy (Signed)
[facility] [address] [place], [place], [address] Phone: [phone]   Fax:  [phone]  Physical Therapy Treatment Rationale for Evaluation and Treatment Rehabilitation  Patient Details  Name: Kenneth FoldsStephen Mcbride MRN: [id number] Date of Birth: 09/13/1985 Referring Provider (PT): Tandy GawJade Breeback, [place]   Encounter Date: 07/27/2021   PT End of Session - 07/27/21 1441     Visit Number 2    Number of Visits 12    Date for PT Re-Evaluation 08/25/21    PT Start Time 1441    PT Stop Time 1531    PT Time Calculation (min) 50 min    Activity Tolerance Patient tolerated treatment well             Past Medical History:  Diagnosis Date   Diabetes mellitus without complication (HCC)    Family history of adverse reaction to anesthesia    father and grand father both heart stopped during surgery   Hypertension    No pertinent past medical history    Spinal stenosis     Past Surgical History:  Procedure Laterality Date   LUMBAR LAMINECTOMY/DECOMPRESSION MICRODISCECTOMY N/A 08/27/2019   Procedure: LUMBAR 2-LUMBAR 5 DECOMPRESSION;  Surgeon: Estill Bambergumonski, Mark, MD;  Location: [facility] OR;  Service: Orthopedics;  Laterality: N/A;   NO PAST SURGERIES      There were no vitals filed for this visit.   Subjective Assessment - 07/27/21 1447     Subjective Patient reports that the pain in the Lt foot, calf, thigh has improved but he continues to have pain in the LB and tingling in the Rt foot. The neck tightness is improved. He has worked on improving the positions for sleep, rest  and work. Notices more popping in the neck but no pain.    Currently in Pain? Yes    Pain Score 3     Pain Location Back    Pain Orientation Left;Right;Lower    Pain Descriptors / Indicators Aching;Nagging    Pain Type Acute pain    Pain Onset 1 to 4 weeks ago    Pain Frequency Intermittent    Aggravating Factors  sitting; driving; walking; bending    Pain  Relieving Factors sleeping; meds                [facility] - 07/27/21 0001       Assessment   Medical Diagnosis LBP; radicular symptoms    Referring Provider (PT) Tandy GawJade Breeback, PA-C    Onset Date/Surgical Date 07/11/21   lumbar surgery 6/21 and history of LBP on an intermittent basis x ~ 10 years   Hand Dominance Left    Next MD Visit none scheduled    Prior Therapy for LBP after surgery      AROM   Lumbar Extension 50% no pain      Flexibility   Hamstrings tight Rt 60 deg; Lt 65deg      Special Tests   Other special tests (+) neural tension test Rt LE                           [facility] Adult PT Treatment/Exercise - 07/27/21 0001       Self-Care   Lifting modified deadlift 20# KB from 8 inch stool 10 reps core engaged      Lumbar Exercises: Stretches   Passive Hamstring Stretch Right;Left;2 reps;30 seconds    Passive Hamstring Stretch Limitations with strap  Hip Flexor Stretch Right;Left;2 reps;30 seconds    Hip Flexor Stretch Limitations sitting VC to engage core    Standing Extension 3 reps;5 seconds    Press Ups 10 reps;5 seconds    Press Ups Limitations exhale and sag end range      Lumbar Exercises: Standing   Wall Slides 10 reps    Wall Slides Limitations 10 sec hold core engaged    Scapular Retraction Strengthening;Both;10 reps;Theraband    Theraband Level (Scapular Retraction) Level 2 (Red)    Row Strengthening;Both;10 reps;Theraband   3 sec hold core engaged   Theraband Level (Row) Level 4 (Blue)    Row Limitations bow and arrow blue TB x 10 reps each side    Other Standing Lumbar Exercises antirotation blue TB one layer x 10 reps each side 3 sec hold      Lumbar Exercises: Seated   Sit to Stand 10 reps    Sit to Stand Limitations VC to engage core      Lumbar Exercises: Supine   AB Set Limitations pulling belly button to back bone to engage core    Other Supine Lumbar Exercises neural stretch supine HS x 10 reps       Shoulder Exercises: Stretch   Other Shoulder Stretches doorway 3 positions 30 sec hold x 1 each position                     PT Education - 07/27/21 1522     Education Details HEP    Person(s) Educated Patient    Methods Explanation;Demonstration;Tactile cues;Verbal cues;Handout    Comprehension Verbalized understanding;Returned demonstration;Verbal cues required;Tactile cues required                 PT Long Term Goals - 07/14/21 1756       PT LONG TERM GOAL #1   Title Decrease pain in lumbar and cervical spine as well as radicular symptoms in bilat LE's by 50-75% allowing patient to return to normal functional activities    Time 6    Period Weeks    Status New    Target Date 08/25/21      PT LONG TERM GOAL #2   Title Increase AROM through lumbar and cervical spine to WFL's with no pain or pain no > than 1 to 2/10 on 0/10 pain scale    Time 6    Period Weeks    Status New    Target Date 08/25/21      PT LONG TERM GOAL #3   Title Patient to demonatrate correct bady mechanics with functional activities including sitting and position for sleep    Time 6    Period Weeks    Status New    Target Date 08/25/21      PT LONG TERM GOAL #4   Title Independent in HEP (including aquatic therapy as indicated)    Time 6    Period Weeks    Status New    Target Date 08/25/21      PT LONG TERM GOAL #5   Title Improve functional limitation score to 67    Time 6    Period Weeks    Status New    Target Date 08/25/21                   Plan - 07/27/21 1441     Clinical Impression Statement Patient returns with resolving Lt LE radicular symptoms and decreased back and neck pain.  He demonstrates improved trunk extension. Added core stabilization for upper and lower core; continued with education re- back care and body mechanics; lifting. Added neural mobilization for Rt LE (tingling present from earlier back injury and surgery). Progressing well toward stated  goals of therapy.    Rehab Potential Good    PT Frequency 2x / week    PT Duration 6 weeks    PT Treatment/Interventions ADLs/Self Care Home Management;Aquatic Therapy;Cryotherapy;Electrical Stimulation;Iontophoresis 4mg /ml Dexamethasone;Moist Heat;Ultrasound;Therapeutic activities;Functional mobility training;Therapeutic exercise;Balance training;Neuromuscular re-education;Patient/family education;Manual techniques;Passive range of motion;Dry needling;Taping    PT Next Visit Plan assess cervical ROM/musculat tightness; review and progress exercise program for spinal stability ad strengthening; postural education and correction; manual work/DN/modalities as indicated    PT Home Exercise Plan    Consulted and Agree with Plan of Care Patient             Patient will benefit from skilled therapeutic intervention in order to improve the following deficits and impairments:     Visit Diagnosis: Lumbar radiculopathy  Cervicalgia  Other symptoms and signs involving the musculoskeletal system     Problem List Patient Active Problem List   Diagnosis Date Noted   Acute right-sided low back pain with right-sided sciatica 07/14/2021   Neck pain 07/14/2021   Sinus headache 06/22/2021   Allergic sinusitis 06/22/2021   Seasonal allergic rhinitis due to pollen 06/22/2021   ADHD (attention deficit hyperactivity disorder), combined type 06/23/2020   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 (HCC) 06/23/2020   GAD (generalized anxiety disorder) 06/23/2020   Change in consistency of stool 05/03/2020   Skin tag 05/03/2020   Depressed mood 01/14/2020   Irritable 01/14/2020   Non-restorative sleep 01/14/2020   Snoring 01/14/2020   Poor concentration 01/14/2020   Elevated blood pressure reading without diagnosis of hypertension 12/26/2019   Acute nonintractable headache 12/26/2019   Dizziness 12/26/2019   Anxiety 12/01/2019   Dyslipidemia, goal LDL below 70 10/28/2019    Injury of left ring finger 09/22/2019   Left fourth distal phalangeal exostosis 09/22/2019   Spinal stenosis 08/27/2019   Class 2 severe obesity due to excess calories with serious comorbidity and body mass index (BMI) of 35.0 to 35.9 in adult (HCC) 08/12/2019   Diabetes mellitus type II, controlled (HCC) 08/12/2019   Lumbar spinal stenosis 05/20/2019   Tendinitis of left hand 01/16/2014    Vibha Ferdig 01/18/2014, PT, MPH  07/27/2021, 4:21 PM  [facility] [address] [place], [place], [place] Phone: [phone]   Fax:  [phone]  Name: Bunny Lowdermilk MRN: Kenneth Mcbride Date of Birth: 12/22/1985

## 2021-07-27 NOTE — Patient Instructions (Addendum)
Access Code: [email or web address] URL: [email or web address] Date: 07/27/2021 Prepared by: Corlis Leak  Exercises - Prone Press Up  - 2 x daily - 7 x weekly - 1 sets - 10 reps - 2-3 sec  hold - Standing Lumbar Extension  - 2 x daily - 7 x weekly - 1 sets - 2-3 reps - 2-3 sec  hold - Seated Hip Flexor Stretch  - 2 x daily - 7 x weekly - 1 sets - 3 reps - 30 sec  hold - Supine Cervical Retraction with Towel  - 2 x daily - 7 x weekly - 1 sets - 5-10 reps - 10 sec  hold - Seated Cervical Retraction  - 2 x daily - 7 x weekly - 1-2 sets - 5-10 reps - 10 sec  hold - Supine Transversus Abdominis Bracing with Pelvic Floor Contraction  - 2 x daily - 7 x weekly - 1 sets - 10 reps - 10sec  hold - Walking  - 2-3 x daily - 7 x weekly - Doorway Pec Stretch at 60 Degrees Abduction  - 3 x daily - 7 x weekly - 1 sets - 3 reps - Doorway Pec Stretch at 90 Degrees Abduction  - 3 x daily - 7 x weekly - 1 sets - 3 reps - 30 seconds  hold - Doorway Pec Stretch at 120 Degrees Abduction  - 3 x daily - 7 x weekly - 1 sets - 3 reps - 30 second hold  hold - Hooklying Hamstring Stretch with Strap  - 2 x daily - 7 x weekly - 1 sets - 3 reps - 30 sec  hold - Sit to Stand  - 2 x daily - 7 x weekly - 1 sets - 10 reps - 3-5 sec  hold - Wall Quarter Squat  - 2 x daily - 7 x weekly - 1-2 sets - 10 reps - 5-10 sec  hold - Shoulder External Rotation and Scapular Retraction with Resistance  - 2 x daily - 7 x weekly - 3 sets - 10 reps - Scapular Retraction with Resistance  - 2 x daily - 7 x weekly - 3 sets - 10 reps - Drawing Bow  - 1 x daily - 7 x weekly - 1 sets - 10 reps - 3 sec  hold - Anti-Rotation Lateral Stepping with Press  - 2 x daily - 7 x weekly - 1-2 sets - 10 reps - 2-3 sec  hold - Modified Deadlift with Pelvic Contraction  - 1 x daily - 7 x weekly - 1 sets - 10 reps - Supine Sciatic Nerve Glide  - 2 x daily - 7 x weekly - 1 sets - 8-10 reps - 1-2 sec  hold

## 2021-07-29 ENCOUNTER — Encounter: Payer: Self-pay | Admitting: Physician Assistant

## 2021-08-02 ENCOUNTER — Ambulatory Visit: Payer: BC Managed Care – PPO | Attending: Physician Assistant | Admitting: Physical Therapy

## 2021-08-02 DIAGNOSIS — M542 Cervicalgia: Secondary | ICD-10-CM | POA: Diagnosis not present

## 2021-08-02 DIAGNOSIS — M5416 Radiculopathy, lumbar region: Secondary | ICD-10-CM | POA: Diagnosis not present

## 2021-08-02 DIAGNOSIS — R29898 Other symptoms and signs involving the musculoskeletal system: Secondary | ICD-10-CM | POA: Diagnosis not present

## 2021-08-02 NOTE — Therapy (Signed)
[facility] [place] [address] [place], [place], [address] Phone: [phone]   Fax:  [phone]  Physical Therapy Treatment  Patient Details  Name: Kenneth Mcbride MRN: [id number] Date of Birth: 02-09-1986 Referring Provider (PT): Tandy Gaw, [place]   Encounter Date: 08/02/2021   PT End of Session - 08/02/21 1532     Visit Number 3    Number of Visits 12    Date for PT Re-Evaluation 08/25/21    PT Start Time 1532    PT Stop Time 1615    PT Time Calculation (min) 43 min    Activity Tolerance Patient tolerated treatment well             Past Medical History:  Diagnosis Date   Diabetes mellitus without complication (HCC)    Family history of adverse reaction to anesthesia    father and grand father both heart stopped during surgery   Hypertension    No pertinent past medical history    Spinal stenosis     Past Surgical History:  Procedure Laterality Date   LUMBAR LAMINECTOMY/DECOMPRESSION MICRODISCECTOMY N/A 08/27/2019   Procedure: LUMBAR 2-LUMBAR 5 DECOMPRESSION;  Surgeon: Estill Bamberg, MD;  Location: [facility] OR;  Service: Orthopedics;  Laterality: N/A;   NO PAST SURGERIES      There were no vitals filed for this visit.   Subjective Assessment - 08/02/21 1540     Subjective Pt reports he is feeling better in a lot of ways. Continues to feel tingling in his right foot. Low back remains aggravated.    Pertinent History lumbar surgery 08/27/19 for disectomy and laminectomy after 3 months of increased LBP and Rt LE pain and tingling with good resolution followng surgery; intermittent LBP pain for ~ 10 years; HTN; AODM    Pain Onset 1 to 4 weeks ago                [facility] PT Assessment - 08/02/21 0001       Assessment   Medical Diagnosis LBP; radicular symptoms    Referring Provider (PT) Tandy Gaw, PA-C    Onset Date/Surgical Date 07/11/21    Hand Dominance Left                            [facility] Adult PT Treatment/Exercise - 08/02/21 0001       Lumbar Exercises: Stretches   Passive Hamstring Stretch Right;Left;2 reps;30 seconds    Passive Hamstring Stretch Limitations seated    Hip Flexor Stretch Right;Left;2 reps;30 seconds    Hip Flexor Stretch Limitations sitting VC to engage core    Other Lumbar Stretch Exercise Child's pose x30 sec with ltaeral flexion x30 sec each side    Other Lumbar Stretch Exercise Fibularis stretch 2x30 sec; gastroc stretch 2x30 sec      Lumbar Exercises: Aerobic   Nustep L5 x 5 min      Lumbar Exercises: Standing   Row Strengthening;Both;Theraband;20 reps    Theraband Level (Row) Level 4 (Blue)    Shoulder Extension Strengthening;Both;20 reps;Theraband    Theraband Level (Shoulder Extension) Level 4 (Blue)    Other Standing Lumbar Exercises antirotation green TB two layer x 10 reps each side 3 sec hold    Other Standing Lumbar Exercises tandem stance x30 sec                          PT Long Term Goals -  07/14/21 1756       PT LONG TERM GOAL #1   Title Decrease pain in lumbar and cervical spine as well as radicular symptoms in bilat LE's by 50-75% allowing patient to return to normal functional activities    Time 6    Period Weeks    Status New    Target Date 08/25/21      PT LONG TERM GOAL #2   Title Increase AROM through lumbar and cervical spine to WFL's with no pain or pain no > than 1 to 2/10 on 0/10 pain scale    Time 6    Period Weeks    Status New    Target Date 08/25/21      PT LONG TERM GOAL #3   Title Patient to demonatrate correct bady mechanics with functional activities including sitting and position for sleep    Time 6    Period Weeks    Status New    Target Date 08/25/21      PT LONG TERM GOAL #4   Title Independent in HEP (including aquatic therapy as indicated)    Time 6    Period Weeks    Status New    Target Date 08/25/21      PT LONG TERM GOAL #5    Title Improve functional limitation score to 67    Time 6    Period Weeks    Status New    Target Date 08/25/21                   Plan - 08/02/21 1713     Clinical Impression Statement Continued intermittent N/T in R foot. Discussed performing ankle/calf stretching to decrease any muscle tension pushing into nerve as it comes down his leg. Continued to progress core exercises this session. Deferred cervical assessment as he felt the back needed to continue to be addressed as this has been more problematic.    Personal Factors and Comorbidities Comorbidity 1;Comorbidity 2;Fitness    Comorbidities AAODM; HTN; sedentary lifestyle    Examination-Activity Limitations Sit;Sleep;Lift;Stand    Examination-Participation Restrictions Occupation;Driving;Yard Work    Rehab Potential Good    PT Frequency 2x / week    PT Duration 6 weeks    PT Treatment/Interventions ADLs/Self Care Home Management;Aquatic Therapy;Cryotherapy;Electrical Stimulation;Iontophoresis 4mg /ml Dexamethasone;Moist Heat;Ultrasound;Therapeutic activities;Functional mobility training;Therapeutic exercise;Balance training;Neuromuscular re-education;Patient/family education;Manual techniques;Passive range of motion;Dry needling;Taping    PT Next Visit Plan assess cervical ROM/musculat tightness; review and progress exercise program for spinal stability ad strengthening; postural education and correction; manual work/DN/modalities as indicated    PT Home Exercise Plan [id number]    Consulted and Agree with Plan of Care Patient             Patient will benefit from skilled therapeutic intervention in order to improve the following deficits and impairments:  Decreased range of motion, Decreased activity tolerance, Pain, Decreased balance, Hypomobility, Impaired flexibility, Improper body mechanics, Decreased mobility, Decreased strength, Impaired sensation, Postural dysfunction  Visit Diagnosis: Lumbar  radiculopathy  Cervicalgia  Other symptoms and signs involving the musculoskeletal system     Problem List Patient Active Problem List   Diagnosis Date Noted   Acute right-sided low back pain with right-sided sciatica 07/14/2021   Neck pain 07/14/2021   Sinus headache 06/22/2021   Allergic sinusitis 06/22/2021   Seasonal allergic rhinitis due to pollen 06/22/2021   ADHD (attention deficit hyperactivity disorder), combined type 06/23/2020   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 (HCC) 06/23/2020  GAD (generalized anxiety disorder) 06/23/2020   Change in consistency of stool 05/03/2020   Skin tag 05/03/2020   Depressed mood 01/14/2020   Irritable 01/14/2020   Non-restorative sleep 01/14/2020   Snoring 01/14/2020   Poor concentration 01/14/2020   Elevated blood pressure reading without diagnosis of hypertension 12/26/2019   Acute nonintractable headache 12/26/2019   Dizziness 12/26/2019   Anxiety 12/01/2019   Dyslipidemia, goal LDL below 70 10/28/2019   Injury of left ring finger 09/22/2019   Left fourth distal phalangeal exostosis 09/22/2019   Spinal stenosis 08/27/2019   Class 2 severe obesity due to excess calories with serious comorbidity and body mass index (BMI) of 35.0 to 35.9 in adult [facility]) 08/12/2019   Diabetes mellitus type II, controlled (HCC) 08/12/2019   Lumbar spinal stenosis 05/20/2019   Tendinitis of left hand 01/16/2014    [facility] April Dell Ponto, PT, DPT 08/02/2021, 5:16 PM  [facility] [address] [place], [place], [address] Phone: [phone]   Fax:  [phone]  Name: Boysie Bonebrake MRN: [id number] Date of Birth: 04/15/1985

## 2021-08-04 ENCOUNTER — Ambulatory Visit: Payer: BC Managed Care – PPO | Admitting: Physical Therapy

## 2021-08-04 DIAGNOSIS — M542 Cervicalgia: Secondary | ICD-10-CM | POA: Diagnosis not present

## 2021-08-04 DIAGNOSIS — R29898 Other symptoms and signs involving the musculoskeletal system: Secondary | ICD-10-CM

## 2021-08-04 DIAGNOSIS — M5416 Radiculopathy, lumbar region: Secondary | ICD-10-CM | POA: Diagnosis not present

## 2021-08-04 NOTE — Therapy (Signed)
[facility] [place] [address] [place], [place], [address] Phone: [phone]   Fax:  [phone]  Physical Therapy Treatment  Patient Details  Name: Kenneth FoldsStephen Mcbride MRN: [id number] Date of Birth: 11-12-1985 Referring Provider (PT): Tandy GawJade Breeback, [place]   Encounter Date: 08/04/2021   PT End of Session - 08/04/21 1530     Visit Number 4    Number of Visits 12    Date for PT Re-Evaluation 08/25/21    PT Start Time 1530    PT Stop Time 1615    PT Time Calculation (min) 45 min    Activity Tolerance Patient tolerated treatment well             Past Medical History:  Diagnosis Date   Diabetes mellitus without complication (HCC)    Family history of adverse reaction to anesthesia    father and grand father both heart stopped during surgery   Hypertension    No pertinent past medical history    Spinal stenosis     Past Surgical History:  Procedure Laterality Date   LUMBAR LAMINECTOMY/DECOMPRESSION MICRODISCECTOMY N/A 08/27/2019   Procedure: LUMBAR 2-LUMBAR 5 DECOMPRESSION;  Surgeon: Estill Bambergumonski, Mark, MD;  Location: [facility] OR;  Service: Orthopedics;  Laterality: N/A;   NO PAST SURGERIES      There were no vitals filed for this visit.   Subjective Assessment - 08/04/21 1532     Subjective Pt states he bowled last night. States when he pushed off his left foot to bowl he felt a pop in his L low back causing his L LE to go limp and a sharp spasm pain in his posterior R thigh.    Pertinent History lumbar surgery 08/27/19 for disectomy and laminectomy after 3 months of increased LBP and Rt LE pain and tingling with good resolution followng surgery; intermittent LBP pain for ~ 10 years; HTN; AODM    Patient Stated Goals get rid of pain and tingling    Currently in Pain? Yes    Pain Score 3     Pain Location Back    Pain Orientation Left;Right;Lower    Pain Descriptors / Indicators Sore    Pain Type Acute pain    Pain Onset 1 to 4  weeks ago                [facility] PT Assessment - 08/04/21 0001       Assessment   Medical Diagnosis LBP; radicular symptoms    Referring Provider (PT) Tandy GawJade Breeback, PA-C    Onset Date/Surgical Date 07/11/21    Hand Dominance Left                           [facility] Adult PT Treatment/Exercise - 08/04/21 0001       Lumbar Exercises: Stretches   Piriformis Stretch Right;30 seconds;2 reps    Other Lumbar Stretch Exercise pball forward flexion x30 sec; with lateral flexion x30 sec    Other Lumbar Stretch Exercise Fibularis stretch 2x30 sec; gastroc stretch 2x30 sec      Lumbar Exercises: Standing   Other Standing Lumbar Exercises antirotation green TB two layer x 10 reps each side 3 sec hold    Other Standing Lumbar Exercises sports cord side stepping, forward and backwards stepping x10 each direction; lateral L hip shift 2x10   increased tingling when L side stepping     Lumbar Exercises: Seated   Other Seated Lumbar Exercises Ankle inversion/eversion  2x10      Lumbar Exercises: Supine   AB Set Limitations pulling belly button to back bone to engage core    Pelvic Tilt 3 reps;10 seconds    Other Supine Lumbar Exercises ab set with feet on pball flexing and extending 2x10; feet on pball alternating marching 2x10      Lumbar Exercises: Quadruped   Opposite Arm/Leg Raise Right arm/Left leg;Left arm/Right leg;10 reps                          PT Long Term Goals - 07/14/21 1756       PT LONG TERM GOAL #1   Title Decrease pain in lumbar and cervical spine as well as radicular symptoms in bilat LE's by 50-75% allowing patient to return to normal functional activities    Time 6    Period Weeks    Status New    Target Date 08/25/21      PT LONG TERM GOAL #2   Title Increase AROM through lumbar and cervical spine to WFL's with no pain or pain no > than 1 to 2/10 on 0/10 pain scale    Time 6    Period Weeks    Status New    Target Date 08/25/21       PT LONG TERM GOAL #3   Title Patient to demonatrate correct bady mechanics with functional activities including sitting and position for sleep    Time 6    Period Weeks    Status New    Target Date 08/25/21      PT LONG TERM GOAL #4   Title Independent in HEP (including aquatic therapy as indicated)    Time 6    Period Weeks    Status New    Target Date 08/25/21      PT LONG TERM GOAL #5   Title Improve functional limitation score to 67    Time 6    Period Weeks    Status New    Target Date 08/25/21                   Plan - 08/04/21 1619     Clinical Impression Statement Treatment continues to focus on core and trunk stability/strengthening. Found decrease in R foot N/T with L lateral hip shift into wall. Will continues to improve pelvic shift to decrease neural tension into R foot.    Personal Factors and Comorbidities Comorbidity 1;Comorbidity 2;Fitness    Comorbidities AAODM; HTN; sedentary lifestyle    Examination-Activity Limitations Sit;Sleep;Lift;Stand    Examination-Participation Restrictions Occupation;Driving;Yard Work    Rehab Potential Good    PT Frequency 2x / week    PT Duration 6 weeks    PT Treatment/Interventions ADLs/Self Care Home Management;Aquatic Therapy;Cryotherapy;Electrical Stimulation;Iontophoresis 4mg /ml Dexamethasone;Moist Heat;Ultrasound;Therapeutic activities;Functional mobility training;Therapeutic exercise;Balance training;Neuromuscular re-education;Patient/family education;Manual techniques;Passive range of motion;Dry needling;Taping    PT Next Visit Plan assess cervical ROM/musculat tightness; review and progress exercise program for spinal stability ad strengthening; postural education and correction; manual work/DN/modalities as indicated    PT Home Exercise Plan [phone]    Consulted and Agree with Plan of Care Patient             Patient will benefit from skilled therapeutic intervention in order to improve the following  deficits and impairments:  Decreased range of motion, Decreased activity tolerance, Pain, Decreased balance, Hypomobility, Impaired flexibility, Improper body mechanics, Decreased mobility, Decreased strength, Impaired sensation, Postural dysfunction  Visit Diagnosis: Lumbar radiculopathy  Cervicalgia  Other symptoms and signs involving the musculoskeletal system     Problem List Patient Active Problem List   Diagnosis Date Noted   Acute right-sided low back pain with right-sided sciatica 07/14/2021   Neck pain 07/14/2021   Sinus headache 06/22/2021   Allergic sinusitis 06/22/2021   Seasonal allergic rhinitis due to pollen 06/22/2021   ADHD (attention deficit hyperactivity disorder), combined type 06/23/2020   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 (HCC) 06/23/2020   GAD (generalized anxiety disorder) 06/23/2020   Change in consistency of stool 05/03/2020   Skin tag 05/03/2020   Depressed mood 01/14/2020   Irritable 01/14/2020   Non-restorative sleep 01/14/2020   Snoring 01/14/2020   Poor concentration 01/14/2020   Elevated blood pressure reading without diagnosis of hypertension 12/26/2019   Acute nonintractable headache 12/26/2019   Dizziness 12/26/2019   Anxiety 12/01/2019   Dyslipidemia, goal LDL below 70 10/28/2019   Injury of left ring finger 09/22/2019   Left fourth distal phalangeal exostosis 09/22/2019   Spinal stenosis 08/27/2019   Class 2 severe obesity due to excess calories with serious comorbidity and body mass index (BMI) of 35.0 to 35.9 in adult [facility]) 08/12/2019   Diabetes mellitus type II, controlled (HCC) 08/12/2019   Lumbar spinal stenosis 05/20/2019   Tendinitis of left hand 01/16/2014    [facility] April Dell Ponto, PT, DPT 08/04/2021, 4:21 PM  [facility] [address] [place] [address] [place], [place], [address] Phone: [phone]   Fax:  [phone]  Name: Kenneth Mcbride MRN: [id number] Date  of Birth: [birth year]

## 2021-08-09 ENCOUNTER — Ambulatory Visit: Payer: BC Managed Care – PPO | Admitting: Physical Therapy

## 2021-08-09 DIAGNOSIS — R29898 Other symptoms and signs involving the musculoskeletal system: Secondary | ICD-10-CM | POA: Diagnosis not present

## 2021-08-09 DIAGNOSIS — M5416 Radiculopathy, lumbar region: Secondary | ICD-10-CM

## 2021-08-09 DIAGNOSIS — M542 Cervicalgia: Secondary | ICD-10-CM

## 2021-08-09 NOTE — Therapy (Signed)
[facility] [place] [address] [place] [address] [place], [place], [address] Phone: [phone]   Fax:  [phone]  Physical Therapy Treatment  Patient Details  Name: Kenneth Mcbride MRN: [id number] Date of Birth: June 27, 1985 Referring Provider (PT): Tandy Gaw, [place]   Encounter Date: 08/09/2021   PT End of Session - 08/09/21 1615     Visit Number 5    Number of Visits 12    Date for PT Re-Evaluation 08/25/21    PT Start Time 1617    PT Stop Time 1700    PT Time Calculation (min) 43 min    Activity Tolerance Patient tolerated treatment well    Behavior During Therapy [facility] for tasks assessed/performed             Past Medical History:  Diagnosis Date   Diabetes mellitus without complication (HCC)    Family history of adverse reaction to anesthesia    father and grand father both heart stopped during surgery   Hypertension    No pertinent past medical history    Spinal stenosis     Past Surgical History:  Procedure Laterality Date   LUMBAR LAMINECTOMY/DECOMPRESSION MICRODISCECTOMY N/A 08/27/2019   Procedure: LUMBAR 2-LUMBAR 5 DECOMPRESSION;  Surgeon: Estill Bamberg, MD;  Location: [facility] OR;  Service: Orthopedics;  Laterality: N/A;   NO PAST SURGERIES      There were no vitals filed for this visit.   Subjective Assessment - 08/09/21 1617     Subjective Pt states he was on the way to work and had a flat tire. Pt states he was sore after doing this. Pt reports before this he has been doing well. Pt notes that he gets full relief of his foot symptoms with doing the lateral shift into the wall; however, once he stops within 10 min the tingling will begin again.    Pertinent History lumbar surgery 08/27/19 for disectomy and laminectomy after 3 months of increased LBP and Rt LE pain and tingling with good resolution followng surgery; intermittent LBP pain for ~ 10 years; HTN; AODM    Patient Stated Goals get rid of pain and tingling    Pain  Onset 1 to 4 weeks ago                [facility] PT Assessment - 08/09/21 0001       Assessment   Medical Diagnosis LBP; radicular symptoms    Referring Provider (PT) Tandy Gaw, PA-C    Onset Date/Surgical Date 07/11/21    Hand Dominance Left                           [facility] Adult PT Treatment/Exercise - 08/09/21 0001       Lumbar Exercises: Stretches   Other Lumbar Stretch Exercise child's pose x30 sec      Lumbar Exercises: Aerobic   Tread Mill 1.8 mph x 5 min warm up      Lumbar Exercises: Standing   Other Standing Lumbar Exercises in front of mirror: Standing L hip shifts without wall wall 2x10; antirotation red tband doubled maintaining hip shift to the L 2x10    Other Standing Lumbar Exercises lateral L hip shift 10x10 sec, hip shift with extension 10x10 sec into wall; step up taps on 4" maintaining lateral shift 2x10 each leg      Lumbar Exercises: Quadruped   Straight Leg Raise 20 reps    Plank 2x20 sec forearms  PT Long Term Goals - 07/14/21 1756       PT LONG TERM GOAL #1   Title Decrease pain in lumbar and cervical spine as well as radicular symptoms in bilat LE's by 50-75% allowing patient to return to normal functional activities    Time 6    Period Weeks    Status New    Target Date 08/25/21      PT LONG TERM GOAL #2   Title Increase AROM through lumbar and cervical spine to WFL's with no pain or pain no > than 1 to 2/10 on 0/10 pain scale    Time 6    Period Weeks    Status New    Target Date 08/25/21      PT LONG TERM GOAL #3   Title Patient to demonatrate correct bady mechanics with functional activities including sitting and position for sleep    Time 6    Period Weeks    Status New    Target Date 08/25/21      PT LONG TERM GOAL #4   Title Independent in HEP (including aquatic therapy as indicated)    Time 6    Period Weeks    Status New    Target Date 08/25/21      PT LONG  TERM GOAL #5   Title Improve functional limitation score to 67    Time 6    Period Weeks    Status New    Target Date 08/25/21                   Plan - 08/09/21 1702     Clinical Impression Statement Session focused on neuro re-ed maintaining L lateral hip shift in standing without wall support and during all core and trunk stabilization exercises. Able to maintain while maintaining single leg stability on steps. No tingling noted throughout session except for one instance stepping down from table but quickly resolved with lateral shifting.    Personal Factors and Comorbidities Comorbidity 1;Comorbidity 2;Fitness    Comorbidities AAODM; HTN; sedentary lifestyle    Examination-Activity Limitations Sit;Sleep;Lift;Stand    Examination-Participation Restrictions Occupation;Driving;Yard Work    Rehab Potential Good    PT Frequency 2x / week    PT Duration 6 weeks    PT Treatment/Interventions ADLs/Self Care Home Management;Aquatic Therapy;Cryotherapy;Electrical Stimulation;Iontophoresis 4mg /ml Dexamethasone;Moist Heat;Ultrasound;Therapeutic activities;Functional mobility training;Therapeutic exercise;Balance training;Neuromuscular re-education;Patient/family education;Manual techniques;Passive range of motion;Dry needling;Taping    PT Next Visit Plan assess cervical ROM/musculat tightness; review and progress exercise program for spinal stability ad strengthening; postural education and correction; manual work/DN/modalities as indicated    PT Home Exercise Plan [id number]    Consulted and Agree with Plan of Care Patient             Patient will benefit from skilled therapeutic intervention in order to improve the following deficits and impairments:  Decreased range of motion, Decreased activity tolerance, Pain, Decreased balance, Hypomobility, Impaired flexibility, Improper body mechanics, Decreased mobility, Decreased strength, Impaired sensation, Postural dysfunction  Visit  Diagnosis: Lumbar radiculopathy  Cervicalgia  Other symptoms and signs involving the musculoskeletal system     Problem List Patient Active Problem List   Diagnosis Date Noted   Acute right-sided low back pain with right-sided sciatica 07/14/2021   Neck pain 07/14/2021   Sinus headache 06/22/2021   Allergic sinusitis 06/22/2021   Seasonal allergic rhinitis due to pollen 06/22/2021   ADHD (attention deficit hyperactivity disorder), combined type 06/23/2020   Mild episode of recurrent  major depressive disorder (HCC) 06/23/2020   GAD (generalized anxiety disorder) 06/23/2020   Change in consistency of stool 05/03/2020   Skin tag 05/03/2020   Depressed mood 01/14/2020   Irritable 01/14/2020   Non-restorative sleep 01/14/2020   Snoring 01/14/2020   Poor concentration 01/14/2020   Elevated blood pressure reading without diagnosis of hypertension 12/26/2019   Acute nonintractable headache 12/26/2019   Dizziness 12/26/2019   Anxiety 12/01/2019   Dyslipidemia, goal LDL below 70 10/28/2019   Injury of left ring finger 09/22/2019   Left fourth distal phalangeal exostosis 09/22/2019   Spinal stenosis 08/27/2019   Class 2 severe obesity due to excess calories with serious comorbidity and body mass index (BMI) of 35.0 to 35.9 in adult [facility]) 08/12/2019   Diabetes mellitus type II, controlled (HCC) 08/12/2019   Lumbar spinal stenosis 05/20/2019   Tendinitis of left hand 01/16/2014    [facility] April Ma L Calistro Rauf, PT, DPT 08/09/2021, 5:05 PM  [facility] [address] [place] [address] [place], [place], [address] Phone: [phone]   Fax:  [phone]  Name: Kenneth Mcbride MRN: [id number] Date of Birth: [birth year]

## 2021-08-11 ENCOUNTER — Ambulatory Visit: Payer: BC Managed Care – PPO | Admitting: Rehabilitative and Restorative Service Providers"

## 2021-08-11 ENCOUNTER — Encounter: Payer: Self-pay | Admitting: Rehabilitative and Restorative Service Providers"

## 2021-08-11 DIAGNOSIS — M542 Cervicalgia: Secondary | ICD-10-CM | POA: Diagnosis not present

## 2021-08-11 DIAGNOSIS — M5416 Radiculopathy, lumbar region: Secondary | ICD-10-CM

## 2021-08-11 DIAGNOSIS — R29898 Other symptoms and signs involving the musculoskeletal system: Secondary | ICD-10-CM | POA: Diagnosis not present

## 2021-08-11 NOTE — Therapy (Signed)
[facility] [place] [address] [place], [place], [address] Phone: [phone]   Fax:  [phone]  Physical Therapy Treatment Rationale for Evaluation and Treatment Rehabilitation  Patient Details  Name: Kenneth Mcbride MRN: [id number] Date of Birth: [birth year] Referring Provider (PT): Tandy Gaw, [place]   Encounter Date: 08/11/2021   PT End of Session - 08/11/21 1619     Visit Number 6    Number of Visits 12    Date for PT Re-Evaluation 08/25/21    PT Start Time 1613    PT Stop Time 1700    PT Time Calculation (min) 47 min    Activity Tolerance Patient tolerated treatment well             Past Medical History:  Diagnosis Date   Diabetes mellitus without complication (HCC)    Family history of adverse reaction to anesthesia    father and grand father both heart stopped during surgery   Hypertension    No pertinent past medical history    Spinal stenosis     Past Surgical History:  Procedure Laterality Date   LUMBAR LAMINECTOMY/DECOMPRESSION MICRODISCECTOMY N/A 08/27/2019   Procedure: LUMBAR 2-LUMBAR 5 DECOMPRESSION;  Surgeon: Estill Bamberg, MD;  Location: [facility] OR;  Service: Orthopedics;  Laterality: N/A;   NO PAST SURGERIES      There were no vitals filed for this visit.   Subjective Assessment - 08/11/21 1620     Subjective Patient reports that he has had a flare up of back pain all week after changing a tire Monday and driving back and forth to work all week, then bowling last night. Noted significant increase in LBP last night but better today with less driving and exercises. Neck is what is bothering him the most today. It is tight and painful with movement. Hurting all the time.    Currently in Pain? No/denies    Pain Score 0-No pain    Pain Location Back                [facility] PT Assessment - 08/11/21 0001       Assessment   Medical Diagnosis LBP; radicular symptoms    Referring Provider (PT)  Tandy Gaw, PA-C    Onset Date/Surgical Date 07/11/21    Hand Dominance Left    Next MD Visit none scheduled    Prior Therapy for LBP after surgery      AROM   Cervical Flexion 52    Cervical Extension 41 sore and tight    Cervical - Right Side Bend 24 tight    Cervical - Left Side Bend 27 pain and tight    Cervical - Right Rotation 54    Cervical - Left Rotation 57      Palpation   Palpation comment palpable tightness Lt/Rt cervical paraspinals and scaleni                           [facility] Adult PT Treatment/Exercise - 08/11/21 0001       Neck Exercises: Seated   Neck Retraction 5 reps;5 secs    Cervical Rotation Right;Left    Cervical Rotation Limitations 3 reps 3 sec hold    Lateral Flexion Right;Left;5 reps    Lateral Flexion Limitations 5 sec hold      Neck Exercises: Prone   Neck Retraction 5 reps    Neck Retraction Limitations 5 sec hold arms at side  Lumbar Exercises: Aerobic   Tread Mill 1.8 mph x 6 min warm up      Lumbar Exercises: Standing   Row Strengthening;Both;Theraband;20 reps    Theraband Level (Row) Level 4 (Blue)    Row Limitations bow and arrow blue TB x 10 reps x 2 sets each side    Shoulder Adduction Limitations lat pull blue TB x 20 reps    Other Standing Lumbar Exercises antirotation blue TB x 10 x 2 sets each side      Shoulder Exercises: Standing   Row Strengthening;Both;20 reps;Theraband    Theraband Level (Shoulder Row) Level 4 (Blue)    Row Limitations bow and arrow blue TB x 10 each side x 2 sets              Trigger Point Dry Needling - 08/11/21 0001     Consent Given? Yes    Education Handout Provided Yes    Muscles Treated Head and Neck Scalenes;Cervical multifidi    Other Dry Needling bilat    Scalenes Response Palpable increased muscle length;Twitch reponse elicited    Cervical multifidi Response Palpable increased muscle length;Twitch reponse elicited                   PT Education -  08/11/21 1654     Education Details DN HEP    Person(s) Educated Patient    Methods Explanation;Demonstration;Tactile cues;Verbal cues;Handout    Comprehension Verbalized understanding;Returned demonstration;Verbal cues required;Tactile cues required                 PT Long Term Goals - 07/14/21 1756       PT LONG TERM GOAL #1   Title Decrease pain in lumbar and cervical spine as well as radicular symptoms in bilat LE's by 50-75% allowing patient to return to normal functional activities    Time 6    Period Weeks    Status New    Target Date 08/25/21      PT LONG TERM GOAL #2   Title Increase AROM through lumbar and cervical spine to WFL's with no pain or pain no > than 1 to 2/10 on 0/10 pain scale    Time 6    Period Weeks    Status New    Target Date 08/25/21      PT LONG TERM GOAL #3   Title Patient to demonatrate correct bady mechanics with functional activities including sitting and position for sleep    Time 6    Period Weeks    Status New    Target Date 08/25/21      PT LONG TERM GOAL #4   Title Independent in HEP (including aquatic therapy as indicated)    Time 6    Period Weeks    Status New    Target Date 08/25/21      PT LONG TERM GOAL #5   Title Improve functional limitation score to 67    Time 6    Period Weeks    Status New    Target Date 08/25/21                   Plan - 08/11/21 1624     Clinical Impression Statement Flare up oof pain and radicular symptoms after changing his tire and then driving to work for the past three days followed by bowling last night. Feeling better today. Patient reports and demonstrates good control of Lt lateral hip in standing. Added core stabilization in  neutral spine avoiding trunk. Assessment and treatment of cervical pain and tightness including DN/manual work and exercises.    Rehab Potential Good    PT Frequency 2x / week    PT Duration 6 weeks    PT Treatment/Interventions ADLs/Self Care Home  Management;Aquatic Therapy;Cryotherapy;Electrical Stimulation;Iontophoresis 4mg /ml Dexamethasone;Moist Heat;Ultrasound;Therapeutic activities;Functional mobility training;Therapeutic exercise;Balance training;Neuromuscular re-education;Patient/family education;Manual techniques;Passive range of motion;Dry needling;Taping    PT Next Visit Plan assess response to DN and ex for cervical ROM/muscular tightness; review and progress exercise program for spinal stability ad strengthening; postural education and correction; manual work/DN/modalities as indicated    PT Home Exercise Plan    Consulted and Agree with Plan of Care Patient             Patient will benefit from skilled therapeutic intervention in order to improve the following deficits and impairments:     Visit Diagnosis: Lumbar radiculopathy  Cervicalgia  Other symptoms and signs involving the musculoskeletal system     Problem List Patient Active Problem List   Diagnosis Date Noted   Acute right-sided low back pain with right-sided sciatica 07/14/2021   Neck pain 07/14/2021   Sinus headache 06/22/2021   Allergic sinusitis 06/22/2021   Seasonal allergic rhinitis due to pollen 06/22/2021   ADHD (attention deficit hyperactivity disorder), combined type 06/23/2020   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 (HCC) 06/23/2020   GAD (generalized anxiety disorder) 06/23/2020   Change in consistency of stool 05/03/2020   Skin tag 05/03/2020   Depressed mood 01/14/2020   Irritable 01/14/2020   Non-restorative sleep 01/14/2020   Snoring 01/14/2020   Poor concentration 01/14/2020   Elevated blood pressure reading without diagnosis of hypertension 12/26/2019   Acute nonintractable headache 12/26/2019   Dizziness 12/26/2019   Anxiety 12/01/2019   Dyslipidemia, goal LDL below 70 10/28/2019   Injury of left ring finger 09/22/2019   Left fourth distal phalangeal exostosis 09/22/2019   Spinal stenosis 08/27/2019    Class 2 severe obesity due to excess calories with serious comorbidity and body mass index (BMI) of 35.0 to 35.9 in adult (HCC) 08/12/2019   Diabetes mellitus type II, controlled (HCC) 08/12/2019   Lumbar spinal stenosis 05/20/2019   Tendinitis of left hand 01/16/2014    Khalil Belote 01/18/2014, PT, MPH  08/11/2021, 5:08 PM  [facility] [address] [place], [place], [place] Phone: [phone]   Fax:  [phone]  Name: Aly Hauser MRN: Jaquita Folds Date of Birth: 1985-11-07

## 2021-08-11 NOTE — Patient Instructions (Addendum)
Trigger Point Dry Needling  What is Trigger Point Dry Needling (DN)? DN is a physical therapy technique used to treat muscle pain and dysfunction. Specifically, DN helps deactivate muscle trigger points (muscle knots).  A thin filiform needle is used to penetrate the skin and stimulate the underlying trigger point. The goal is for a local twitch response (LTR) to occur and for the trigger point to relax. No medication of any kind is injected during the procedure.   What Does Trigger Point Dry Needling Feel Like?  The procedure feels different for each individual patient. Some patients report that they do not actually feel the needle enter the skin and overall the process is not painful. Very mild bleeding may occur. However, many patients feel a deep cramping in the muscle in which the needle was inserted. This is the local twitch response.   How Will I feel after the treatment? Soreness is normal, and the onset of soreness may not occur for a few hours. Typically this soreness does not last longer than two days.  Bruising is uncommon, however; ice can be used to decrease any possible bruising.  In rare cases feeling tired or nauseous after the treatment is normal. In addition, your symptoms may get worse before they get better, this period will typically not last longer than 24 hours.   What Can I do After My Treatment? Increase your hydration by drinking more water for the next 24 hours. You may place ice or heat on the areas treated that have become sore, however, do not use heat on inflamed or bruised areas. Heat often brings more relief post needling. You can continue your regular activities, but vigorous activity is not recommended initially after the treatment for 24 hours. DN is best combined with other physical therapy such as strengthening, stretching, and other therapies.  Access Code: [email or web address] URL: [email or web address] Date: 08/11/2021 Prepared by: Corlis Leak  Exercises - Standing Lumbar Extension  - 2 x daily - 7 x weekly - 1 sets - 2-3 reps - 2-3 sec  hold - Seated Hip Flexor Stretch  - 2 x daily - 7 x weekly - 1 sets - 3 reps - 30 sec  hold - Supine Cervical Retraction with Towel  - 2 x daily - 7 x weekly - 1 sets - 5-10 reps - 10 sec  hold - Seated Cervical Retraction  - 2 x daily - 7 x weekly - 1-2 sets - 5-10 reps - 10 sec  hold - Supine Transversus Abdominis Bracing with Pelvic Floor Contraction  - 2 x daily - 7 x weekly - 1 sets - 10 reps - 10sec  hold - Walking  - 2-3 x daily - 7 x weekly - Doorway Pec Stretch at 60 Degrees Abduction  - 3 x daily - 7 x weekly - 1 sets - 3 reps - Doorway Pec Stretch at 90 Degrees Abduction  - 3 x daily - 7 x weekly - 1 sets - 3 reps - 30 seconds  hold - Doorway Pec Stretch at 120 Degrees Abduction  - 3 x daily - 7 x weekly - 1 sets - 3 reps - 30 second hold  hold - Hooklying Hamstring Stretch with Strap  - 2 x daily - 7 x weekly - 1 sets - 3 reps - 30 sec  hold - Sit to Stand  - 2 x daily - 7 x weekly - 1 sets - 10 reps - 3-5 sec  hold - Wall Quarter  Squat  - 2 x daily - 7 x weekly - 1-2 sets - 10 reps - 5-10 sec  hold - Shoulder External Rotation and Scapular Retraction with Resistance  - 2 x daily - 7 x weekly - 3 sets - 10 reps - Scapular Retraction with Resistance  - 2 x daily - 7 x weekly - 3 sets - 10 reps - Drawing Bow  - 1 x daily - 7 x weekly - 1 sets - 10 reps - 3 sec  hold - Left Standing Lateral Shift Correction at Wall - Repetitions  - 1 x daily - 7 x weekly - 2 sets - 10 reps - 5-10 sec hold - Standard Plank  - 1 x daily - 7 x weekly - 2 sets - 30 sec hold - Beginner Front Arm Support  - 1 x daily - 7 x weekly - 2 sets - 10 reps - Standing Anti-Rotation Press with Anchored Resistance  - 1 x daily - 7 x weekly - 2 sets - 10 reps - Step Taps on High Step  - 1 x daily - 7 x weekly - 2 sets - 10 reps - Standing Lat Pull Down with Resistance - Elbows Bent  - 2 x daily - 7 x weekly - 1 sets -  10 reps - 3 sec  hold - Seated Cervical Retraction  - 2 x daily - 7 x weekly - 1-2 sets - 5-10 reps - 10 sec  hold - Seated Cervical Sidebending AROM  - 2 x daily - 7 x weekly - 1 sets - 5 reps - 5-10 sec  hold - Seated Cervical Rotation AROM  - 2 x daily - 7 x weekly - 1 sets - 5 reps - 2-3 sec  hold - Prone Scapular Retraction  - 2 x daily - 7 x weekly - 1 sets - 5-10 reps - 3-5 sec  hold

## 2021-08-16 ENCOUNTER — Encounter: Payer: BC Managed Care – PPO | Admitting: Rehabilitative and Restorative Service Providers"

## 2021-08-18 ENCOUNTER — Ambulatory Visit: Payer: BC Managed Care – PPO | Admitting: Rehabilitative and Restorative Service Providers"

## 2021-08-18 ENCOUNTER — Encounter: Payer: Self-pay | Admitting: Rehabilitative and Restorative Service Providers"

## 2021-08-18 DIAGNOSIS — M542 Cervicalgia: Secondary | ICD-10-CM | POA: Diagnosis not present

## 2021-08-18 DIAGNOSIS — M5416 Radiculopathy, lumbar region: Secondary | ICD-10-CM

## 2021-08-18 DIAGNOSIS — R29898 Other symptoms and signs involving the musculoskeletal system: Secondary | ICD-10-CM | POA: Diagnosis not present

## 2021-08-18 NOTE — Patient Instructions (Signed)
Access Code: [email or web address] URL: [email or web address] Date: 08/18/2021 Prepared by: Corlis Leak  Exercises - Standing Lumbar Extension  - 2 x daily - 7 x weekly - 1 sets - 2-3 reps - 2-3 sec  hold - Seated Hip Flexor Stretch  - 2 x daily - 7 x weekly - 1 sets - 3 reps - 30 sec  hold - Supine Cervical Retraction with Towel  - 2 x daily - 7 x weekly - 1 sets - 5-10 reps - 10 sec  hold - Seated Cervical Retraction  - 2 x daily - 7 x weekly - 1-2 sets - 5-10 reps - 10 sec  hold - Supine Transversus Abdominis Bracing with Pelvic Floor Contraction  - 2 x daily - 7 x weekly - 1 sets - 10 reps - 10sec  hold - Walking  - 2-3 x daily - 7 x weekly - Doorway Pec Stretch at 60 Degrees Abduction  - 3 x daily - 7 x weekly - 1 sets - 3 reps - Doorway Pec Stretch at 90 Degrees Abduction  - 3 x daily - 7 x weekly - 1 sets - 3 reps - 30 seconds  hold - Doorway Pec Stretch at 120 Degrees Abduction  - 3 x daily - 7 x weekly - 1 sets - 3 reps - 30 second hold  hold - Hooklying Hamstring Stretch with Strap  - 2 x daily - 7 x weekly - 1 sets - 3 reps - 30 sec  hold - Sit to Stand  - 2 x daily - 7 x weekly - 1 sets - 10 reps - 3-5 sec  hold - Wall Quarter Squat  - 2 x daily - 7 x weekly - 1-2 sets - 10 reps - 5-10 sec  hold - Shoulder External Rotation and Scapular Retraction with Resistance  - 2 x daily - 7 x weekly - 3 sets - 10 reps - Scapular Retraction with Resistance  - 2 x daily - 7 x weekly - 3 sets - 10 reps - Drawing Bow  - 1 x daily - 7 x weekly - 1 sets - 10 reps - 3 sec  hold - Left Standing Lateral Shift Correction at Wall - Repetitions  - 1 x daily - 7 x weekly - 2 sets - 10 reps - 5-10 sec hold - Standard Plank  - 1 x daily - 7 x weekly - 2 sets - 30 sec hold - Beginner Front Arm Support  - 1 x daily - 7 x weekly - 2 sets - 10 reps - Standing Anti-Rotation Press with Anchored Resistance  - 1 x daily - 7 x weekly - 2 sets - 10 reps - Step Taps on High Step  - 1 x daily - 7 x weekly - 2 sets -  10 reps - Standing Lat Pull Down with Resistance - Elbows Bent  - 2 x daily - 7 x weekly - 1 sets - 10 reps - 3 sec  hold - Seated Cervical Retraction  - 2 x daily - 7 x weekly - 1-2 sets - 5-10 reps - 10 sec  hold - Seated Cervical Sidebending AROM  - 2 x daily - 7 x weekly - 1 sets - 5 reps - 5-10 sec  hold - Seated Cervical Rotation AROM  - 2 x daily - 7 x weekly - 1 sets - 5 reps - 2-3 sec  hold - Prone Scapular Retraction  - 2 x daily - 7 x  weekly - 1 sets - 5-10 reps - 3-5 sec  hold - Seated Shoulder W  - 2 x daily - 7 x weekly - 1 sets - 10 reps - 3 sec  hold - Shoulder W - External Rotation with Resistance  - 2 x daily - 7 x weekly - 1-2 sets - 10 reps - 3 sec  hold

## 2021-08-18 NOTE — Therapy (Signed)
[facility] [place] [address] [place], [place], [address] Phone: [phone]   Fax:  [phone]  Physical Therapy Treatment  Rationale for Evaluation and Treatment Rehabilitation  Patient Details  Name: Kenneth Mcbride MRN: [id number] Date of Birth: Dec 01, 1985 Referring Provider (PT): Tandy Gaw, [place]   Encounter Date: 08/18/2021   PT End of Session - 08/18/21 1621     Visit Number 7    Number of Visits 12    Date for PT Re-Evaluation 08/25/21    PT Start Time 1618    PT Stop Time 1708    PT Time Calculation (min) 50 min    Activity Tolerance Patient tolerated treatment well             Past Medical History:  Diagnosis Date   Diabetes mellitus without complication (HCC)    Family history of adverse reaction to anesthesia    father and grand father both heart stopped during surgery   Hypertension    No pertinent past medical history    Spinal stenosis     Past Surgical History:  Procedure Laterality Date   LUMBAR LAMINECTOMY/DECOMPRESSION MICRODISCECTOMY N/A 08/27/2019   Procedure: LUMBAR 2-LUMBAR 5 DECOMPRESSION;  Surgeon: Estill Bamberg, MD;  Location: [facility] OR;  Service: Orthopedics;  Laterality: N/A;   NO PAST SURGERIES      There were no vitals filed for this visit.   Subjective Assessment - 08/18/21 1623     Subjective Patient reports that the DN helped the neck. Improvement lasted for several days but has tightened up in the past two days. Larey Seat in his driveway yesterday and tightened the neck up. Did bowl last night with no back pain. Has a little tightness in the Rt LB today. Also tight in the Rt neck area.    Currently in Pain? Yes    Pain Score 1     Pain Location Back    Pain Orientation Right;Left;Lower    Pain Descriptors / Indicators Dull    Pain Type Acute pain                               [facility] Adult PT Treatment/Exercise - 08/18/21 0001       Self-Care   ADL's  carry 10# KB bilat UE x 180' arms at side; 180' weights at shoulder height; 180' weights head height shoulders/elbows - 90/90 deg    Lifting modified deadlift 15# KB from 8 inch stool 10 reps core engaged      Neck Exercises: Seated   Neck Retraction 5 reps;5 secs    Cervical Rotation Right;Left    Cervical Rotation Limitations 3 reps 3 sec hold    Lateral Flexion Right;Left;5 reps    Lateral Flexion Limitations 5 sec hold    W Back 15 reps    W Back Weights (lbs) 3 sec hold    W Back Limitations added green TB x 10 reps    Shoulder Rolls Backwards;10 reps      Neck Exercises: Prone   Neck Retraction 5 reps    Neck Retraction Limitations 10 sec hold arms at side      Lumbar Exercises: Aerobic   Tread Mill 1.8 mph x 6 min warm up      Lumbar Exercises: Standing   Other Standing Lumbar Exercises antirotation blue TB x 10 x 2 sets each side; added shoudler flexion in extended arms position blue TB x 10  reps each side              Trigger Point Dry Needling - 08/18/21 0001     Consent Given? Yes    Education Handout Provided Previously provided    Other Dry Needling bilat    Upper Trapezius Response Palpable increased muscle length;Twitch reponse elicited    Suboccipitals Response Palpable increased muscle length;Twitch response elicited    Scalenes Response Palpable increased muscle length;Twitch reponse elicited    Cervical multifidi Response Palpable increased muscle length;Twitch reponse elicited                   PT Education - 08/18/21 1700     Education Details HEP    Person(s) Educated Patient    Methods Explanation;Demonstration;Tactile cues;Verbal cues;Handout    Comprehension Verbalized understanding;Returned demonstration;Verbal cues required;Tactile cues required                 PT Long Term Goals - 07/14/21 1756       PT LONG TERM GOAL #1   Title Decrease pain in lumbar and cervical spine as well as radicular symptoms in bilat LE's by  50-75% allowing patient to return to normal functional activities    Time 6    Period Weeks    Status New    Target Date 08/25/21      PT LONG TERM GOAL #2   Title Increase AROM through lumbar and cervical spine to WFL's with no pain or pain no > than 1 to 2/10 on 0/10 pain scale    Time 6    Period Weeks    Status New    Target Date 08/25/21      PT LONG TERM GOAL #3   Title Patient to demonatrate correct bady mechanics with functional activities including sitting and position for sleep    Time 6    Period Weeks    Status New    Target Date 08/25/21      PT LONG TERM GOAL #4   Title Independent in HEP (including aquatic therapy as indicated)    Time 6    Period Weeks    Status New    Target Date 08/25/21      PT LONG TERM GOAL #5   Title Improve functional limitation score to 67    Time 6    Period Weeks    Status New    Target Date 08/25/21                   Plan - 08/18/21 1626     Clinical Impression Statement Good response to DN in cervical spine. Flare up of symptoms in the neck with increased palpable tightness and report of discomfort. LBP is improved with patient reporting that he bowled last night with no pain. He is working on his exercises at home. Progressing gradually toward stated goals of therapy.    Rehab Potential Good    PT Frequency 2x / week    PT Duration 6 weeks    PT Treatment/Interventions ADLs/Self Care Home Management;Aquatic Therapy;Cryotherapy;Electrical Stimulation;Iontophoresis 4mg /ml Dexamethasone;Moist Heat;Ultrasound;Therapeutic activities;Functional mobility training;Therapeutic exercise;Balance training;Neuromuscular re-education;Patient/family education;Manual techniques;Passive range of motion;Dry needling;Taping    PT Next Visit Plan continue DN and ex for cervical ROM/muscular tightness; review and progress exercise program for spinal stability ad strengthening; postural education and correction; manual work/DN/modalities as  indicated    PT Home Exercise Plan    Consulted and Agree with Plan of Care Patient  Patient will benefit from skilled therapeutic intervention in order to improve the following deficits and impairments:     Visit Diagnosis: Lumbar radiculopathy  Cervicalgia  Other symptoms and signs involving the musculoskeletal system     Problem List Patient Active Problem List   Diagnosis Date Noted   Acute right-sided low back pain with right-sided sciatica 07/14/2021   Neck pain 07/14/2021   Sinus headache 06/22/2021   Allergic sinusitis 06/22/2021   Seasonal allergic rhinitis due to pollen 06/22/2021   ADHD (attention deficit hyperactivity disorder), combined type 06/23/2020   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 (HCC) 06/23/2020   GAD (generalized anxiety disorder) 06/23/2020   Change in consistency of stool 05/03/2020   Skin tag 05/03/2020   Depressed mood 01/14/2020   Irritable 01/14/2020   Non-restorative sleep 01/14/2020   Snoring 01/14/2020   Poor concentration 01/14/2020   Elevated blood pressure reading without diagnosis of hypertension 12/26/2019   Acute nonintractable headache 12/26/2019   Dizziness 12/26/2019   Anxiety 12/01/2019   Dyslipidemia, goal LDL below 70 10/28/2019   Injury of left ring finger 09/22/2019   Left fourth distal phalangeal exostosis 09/22/2019   Spinal stenosis 08/27/2019   Class 2 severe obesity due to excess calories with serious comorbidity and body mass index (BMI) of 35.0 to 35.9 in adult (HCC) 08/12/2019   Diabetes mellitus type II, controlled (HCC) 08/12/2019   Lumbar spinal stenosis 05/20/2019   Tendinitis of left hand 01/16/2014    Fallen Crisostomo Rober Minion, PT, MPH  08/18/2021, 5:07 PM  [facility] [address] [place], [place], [address] Phone: [phone]   Fax:  [phone]  Name: Kenneth Mcbride MRN: [id number] Date of Birth:  1985-11-22

## 2021-08-23 ENCOUNTER — Ambulatory Visit: Payer: BC Managed Care – PPO | Admitting: Rehabilitative and Restorative Service Providers"

## 2021-08-23 ENCOUNTER — Encounter: Payer: Self-pay | Admitting: Rehabilitative and Restorative Service Providers"

## 2021-08-23 DIAGNOSIS — R29898 Other symptoms and signs involving the musculoskeletal system: Secondary | ICD-10-CM

## 2021-08-23 DIAGNOSIS — M542 Cervicalgia: Secondary | ICD-10-CM

## 2021-08-23 DIAGNOSIS — M5416 Radiculopathy, lumbar region: Secondary | ICD-10-CM | POA: Diagnosis not present

## 2021-08-25 ENCOUNTER — Ambulatory Visit: Payer: BC Managed Care – PPO | Admitting: Rehabilitative and Restorative Service Providers"

## 2021-08-25 ENCOUNTER — Encounter: Payer: Self-pay | Admitting: Rehabilitative and Restorative Service Providers"

## 2021-08-25 DIAGNOSIS — M542 Cervicalgia: Secondary | ICD-10-CM

## 2021-08-25 DIAGNOSIS — R29898 Other symptoms and signs involving the musculoskeletal system: Secondary | ICD-10-CM

## 2021-08-25 DIAGNOSIS — M5416 Radiculopathy, lumbar region: Secondary | ICD-10-CM

## 2021-08-25 NOTE — Therapy (Signed)
[facility] [place] [address] [place], [place], [address] Phone: [phone]   Fax:  [phone]  Physical Therapy Treatment  Rationale for Evaluation and Treatment Rehabilitation  Patient Details  Name: Kenneth Mcbride MRN: [id number] Date of Birth: 1985/05/30 Referring Provider (PT): Tandy Gaw, [place]   Encounter Date: 08/25/2021   PT End of Session - 08/25/21 1605     Visit Number 9    Number of Visits 24    Date for PT Re-Evaluation 10/06/21    PT Start Time 1605    PT Stop Time 1649    PT Time Calculation (min) 44 min    Activity Tolerance Patient tolerated treatment well             Past Medical History:  Diagnosis Date   Diabetes mellitus without complication (HCC)    Family history of adverse reaction to anesthesia    father and grand father both heart stopped during surgery   Hypertension    No pertinent past medical history    Spinal stenosis     Past Surgical History:  Procedure Laterality Date   LUMBAR LAMINECTOMY/DECOMPRESSION MICRODISCECTOMY N/A 08/27/2019   Procedure: LUMBAR 2-LUMBAR 5 DECOMPRESSION;  Surgeon: Estill Bamberg, MD;  Location: [facility] OR;  Service: Orthopedics;  Laterality: N/A;   NO PAST SURGERIES      There were no vitals filed for this visit.   Subjective Assessment - 08/25/21 1605     Subjective Neck feels good. Back feels "pretty good". Has been driving to and from work for the past two days which is always harder. Today having some pain in Lt ankle today and he is not sure why - no known injury. Has been having spasms in both legs today. No longer taking pain medication - last time was two weeks ago.    Currently in Pain? Yes    Pain Score 1     Pain Location Back    Pain Orientation Right;Left;Lower    Pain Descriptors / Indicators Dull    Pain Type Acute pain    Pain Onset More than a month ago    Pain Frequency Intermittent    Aggravating Factors  prolonged sitting;  driving    Pain Relieving Factors sleeping; meds                [facility] PT Assessment - 08/25/21 0001       Assessment   Medical Diagnosis LBP; radicular symptoms    Referring Provider (PT) Tandy Gaw, PA-C    Onset Date/Surgical Date 07/11/21    Hand Dominance Left    Next MD Visit none scheduled    Prior Therapy for LBP after surgery      Observation/Other Assessments   Focus on Therapeutic Outcomes (FOTO)  68      Sensation   Additional Comments tingling Rt foot intermittent but daily      AROM   Cervical Flexion 54    Cervical Extension 43    Cervical - Right Side Bend 40    Cervical - Left Side Bend 38 pain and tightness    Cervical - Right Rotation 64    Cervical - Left Rotation 65    Lumbar Flexion 75%    Lumbar Extension 50%    Lumbar - Right Side Bend 80%    Lumbar - Left Side Bend 80% tight Lt LB    Lumbar - Right Rotation 60%    Lumbar - Left Rotation 70%  Palpation   Palpation comment palpable tightness Lt/Rt cervical paraspinals and scaleni                           [facility] Adult PT Treatment/Exercise - 08/25/21 0001       Neck Exercises: Seated   Neck Retraction 5 reps;5 secs    Cervical Rotation Right;Left    Cervical Rotation Limitations 3 reps 3 sec hold    Lateral Flexion Right;Left;5 reps    Lateral Flexion Limitations 5 sec hold    W Back 15 reps    W Back Weights (lbs) 3 sec hold    W Back Limitations green TB x 10 reps    Shoulder Rolls Backwards;10 reps      Neck Exercises: Prone   Neck Retraction 10 reps    Neck Retraction Limitations 10 sec hold arms at side      Lumbar Exercises: Standing   Row Strengthening;Both;Theraband;20 reps    Theraband Level (Row) Level 4 (Blue)      Shoulder Exercises: Standing   ABduction Strengthening;Both;20 reps;Theraband    Theraband Level (Shoulder ABduction) Level 3 (Green)    Row Strengthening;Both;20 reps;Theraband    Theraband Level (Shoulder Row) Level 4 (Blue)     Retraction Strengthening;Both;20 reps;Theraband    Theraband Level (Shoulder Retraction) Level 3 (Green)    Retraction Limitations L's and W's 20 each green TB    Other Standing Exercises shoulder 90/90 holding 5# KB for overhead press x 10 reps the holding static position      Shoulder Exercises: Stretch   Other Shoulder Stretches doorway 3 positions 30 sec hold x 1 each position    Other Shoulder Stretches shoulder flexion hands over doorway 30 sec x 2 reps                          PT Long Term Goals - 08/25/21 1622       PT LONG TERM GOAL #1   Title Decrease pain in lumbar and cervical spine as well as radicular symptoms in bilat LE's by 50-75% allowing patient to return to normal functional activities    Time 6    Period Weeks    Status Achieved    Target Date 08/25/21      PT LONG TERM GOAL #2   Title Increase AROM through lumbar and cervical spine to WFL's with no pain or pain no > than 1 to 2/10 on 0/10 pain scale    Time 6    Period Weeks    Status On-going    Target Date 10/06/21      PT LONG TERM GOAL #3   Title Patient to demonatrate correct bady mechanics with functional activities including sitting and position for sleep    Time 6    Period Weeks    Status On-going    Target Date 10/06/21      PT LONG TERM GOAL #4   Title Independent in HEP (including aquatic therapy as indicated)    Time 6    Period Weeks    Status On-going    Target Date 10/06/21      PT LONG TERM GOAL #5   Baseline 08/23/21 - 68    Time 6    Period Weeks    Status On-going    Target Date 08/25/21  Plan - 08/25/21 1625     Clinical Impression Statement Continued progress with lumbar and cervical pain and dysfunction. Patient has decresaed pain, increased spinal mobility, decreased radicular symptoms Rt LE; increasing functional activities; decreasing palpable tightness. He is working on postural control and core stabilty and strength. Goals  are partially accomplished. Patient will benefit from continued treatment to accomplish goals and reach maximum rehab potential.    Rehab Potential Good    PT Frequency 2x / week    PT Duration 6 weeks    PT Treatment/Interventions ADLs/Self Care Home Management;Aquatic Therapy;Cryotherapy;Electrical Stimulation;Iontophoresis 4mg /ml Dexamethasone;Moist Heat;Ultrasound;Therapeutic activities;Functional mobility training;Therapeutic exercise;Balance training;Neuromuscular re-education;Patient/family education;Manual techniques;Passive range of motion;Dry needling;Taping    PT Next Visit Plan continue DN and ex for cervical ROM/muscular tightness; review and progress exercise program for spinal stability and strengthening; postural education and correction; manual work/DN/modalities as indicated    PT Home Exercise Plan    Consulted and Agree with Plan of Care Patient             Patient will benefit from skilled therapeutic intervention in order to improve the following deficits and impairments:     Visit Diagnosis: Lumbar radiculopathy  Cervicalgia  Other symptoms and signs involving the musculoskeletal system     Problem List Patient Active Problem List   Diagnosis Date Noted   Acute right-sided low back pain with right-sided sciatica 07/14/2021   Neck pain 07/14/2021   Sinus headache 06/22/2021   Allergic sinusitis 06/22/2021   Seasonal allergic rhinitis due to pollen 06/22/2021   ADHD (attention deficit hyperactivity disorder), combined type 06/23/2020   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 (HCC) 06/23/2020   GAD (generalized anxiety disorder) 06/23/2020   Change in consistency of stool 05/03/2020   Skin tag 05/03/2020   Depressed mood 01/14/2020   Irritable 01/14/2020   Non-restorative sleep 01/14/2020   Snoring 01/14/2020   Poor concentration 01/14/2020   Elevated blood pressure reading without diagnosis of hypertension 12/26/2019   Acute  nonintractable headache 12/26/2019   Dizziness 12/26/2019   Anxiety 12/01/2019   Dyslipidemia, goal LDL below 70 10/28/2019   Injury of left ring finger 09/22/2019   Left fourth distal phalangeal exostosis 09/22/2019   Spinal stenosis 08/27/2019   Class 2 severe obesity due to excess calories with serious comorbidity and body mass index (BMI) of 35.0 to 35.9 in adult (HCC) 08/12/2019   Diabetes mellitus type II, controlled (HCC) 08/12/2019   Lumbar spinal stenosis 05/20/2019   Tendinitis of left hand 01/16/2014    Javonda Suh 01/18/2014, PT, MPH  08/25/2021, 4:50 PM  [facility] [address] [place], [place], [place] Phone: [phone]   Fax:  [phone]  Name: Naheim Burgen MRN: Jaquita Folds Date of Birth: [birth year]

## 2021-08-29 ENCOUNTER — Ambulatory Visit
Payer: BC Managed Care – PPO | Attending: Physician Assistant | Admitting: Rehabilitative and Restorative Service Providers"

## 2021-08-29 ENCOUNTER — Encounter: Payer: Self-pay | Admitting: Rehabilitative and Restorative Service Providers"

## 2021-08-29 DIAGNOSIS — M5416 Radiculopathy, lumbar region: Secondary | ICD-10-CM | POA: Insufficient documentation

## 2021-08-29 DIAGNOSIS — R29898 Other symptoms and signs involving the musculoskeletal system: Secondary | ICD-10-CM | POA: Insufficient documentation

## 2021-08-29 DIAGNOSIS — M542 Cervicalgia: Secondary | ICD-10-CM | POA: Diagnosis not present

## 2021-08-29 NOTE — Therapy (Signed)
[facility] [place] [address] [place], [place], [address] Phone: [phone]   Fax:  [phone]  Physical Therapy Treatment  Rationale for Evaluation and Treatment Rehabilitation  Patient Details  Name: Kenneth Mcbride MRN: [id number] Date of Birth: Nov 14, 1985 Referring Provider (PT): Tandy Gaw, [place]   Encounter Date: 08/29/2021   PT End of Session - 08/29/21 1532     Visit Number 10    Number of Visits 24    Date for PT Re-Evaluation 10/06/21    PT Start Time 1520    PT Stop Time 1612    PT Time Calculation (min) 52 min    Activity Tolerance Patient tolerated treatment well             Past Medical History:  Diagnosis Date   Diabetes mellitus without complication (HCC)    Family history of adverse reaction to anesthesia    father and grand father both heart stopped during surgery   Hypertension    No pertinent past medical history    Spinal stenosis     Past Surgical History:  Procedure Laterality Date   LUMBAR LAMINECTOMY/DECOMPRESSION MICRODISCECTOMY N/A 08/27/2019   Procedure: LUMBAR 2-LUMBAR 5 DECOMPRESSION;  Surgeon: Estill Bamberg, MD;  Location: [facility] OR;  Service: Orthopedics;  Laterality: N/A;   NO PAST SURGERIES      There were no vitals filed for this visit.   Subjective Assessment - 08/29/21 1533     Subjective Patient reports that he is "doing pretty good" over the weekend. Even cleaned some in a storage building. Ankle is doing well now. No pain or problems.    Currently in Pain? No/denies    Pain Score 0-No pain                               [facility] Adult PT Treatment/Exercise - 08/29/21 0001       Therapeutic Activites    Other Therapeutic Activities heel tap lateral step 4 inch step x 10 reps x 2 sets each LE UE support      Lumbar Exercises: Aerobic   Tread Mill 1.8 mph x 8 min warm up      Lumbar Exercises: Standing   Functional Squats Limitations one foot on  step, other on floor bending forward knee 3 sec hold x 10 reps x 2 sets each side    Lifting Limitations overhead press with bamboo pole two 5# wts suspended with red TB    Scapular Retraction Limitations bent row 10# KB lifting from 8 in stool SLS opposite LE x 10 reps x 2 sets    Row Strengthening;Both;Theraband;20 reps    Theraband Level (Row) Level 4 (Blue)    Row Limitations bow and arrow 10 reps x 2 sets each side    Shoulder Extension Limitations overhead lift bamboo pole with two 5# KB suspended on red TB for double arm and single arm overhead press 10 reps x 2 sets each    Shoulder Adduction Limitations lat pull blue TB x 10 reps x 2 sets    Other Standing Lumbar Exercises antirotation blue TB x 10 x 2 sets each side; added shoudler flexion in extended arms position blue TB x 10 reps each side    Other Standing Lumbar Exercises SLS with tapping fwd/side/back/behind x 10 x 2 sets each LE; diver x 5 x 5 sets each LE touching the back of the chair  PT Education - 08/29/21 1612     Education Details HEP    Person(s) Educated Patient    Methods Explanation;Demonstration;Tactile cues;Verbal cues    Comprehension Verbalized understanding;Returned demonstration;Verbal cues required;Tactile cues required                 PT Long Term Goals - 08/25/21 1622       PT LONG TERM GOAL #1   Title Decrease pain in lumbar and cervical spine as well as radicular symptoms in bilat LE's by 50-75% allowing patient to return to normal functional activities    Time 6    Period Weeks    Status Achieved    Target Date 08/25/21      PT LONG TERM GOAL #2   Title Increase AROM through lumbar and cervical spine to WFL's with no pain or pain no > than 1 to 2/10 on 0/10 pain scale    Time 6    Period Weeks    Status On-going    Target Date 10/06/21      PT LONG TERM GOAL #3   Title Patient to demonatrate correct bady mechanics with functional activities including  sitting and position for sleep    Time 6    Period Weeks    Status On-going    Target Date 10/06/21      PT LONG TERM GOAL #4   Title Independent in HEP (including aquatic therapy as indicated)    Time 6    Period Weeks    Status On-going    Target Date 10/06/21      PT LONG TERM GOAL #5   Baseline 08/23/21 - 68    Time 6    Period Weeks    Status On-going    Target Date 08/25/21                   Plan - 08/29/21 1534     Clinical Impression Statement Patient reports that he has no pain in neck and back. Patient progressed with core strengthening and stabilization upper body and lower body. Added balance activities and tasks.    Rehab Potential Good    PT Frequency 2x / week    PT Duration 6 weeks    PT Treatment/Interventions ADLs/Self Care Home Management;Aquatic Therapy;Cryotherapy;Electrical Stimulation;Iontophoresis 4mg /ml Dexamethasone;Moist Heat;Ultrasound;Therapeutic activities;Functional mobility training;Therapeutic exercise;Balance training;Neuromuscular re-education;Patient/family education;Manual techniques;Passive range of motion;Dry needling;Taping    PT Next Visit Plan continue DN and ex for cervical ROM/muscular tightness as indicated; review and progress exercise program for spinal stability and strengthening; postural education and correction; dynamic balance activities    PT Home Exercise Plan    Consulted and Agree with Plan of Care Patient             Patient will benefit from skilled therapeutic intervention in order to improve the following deficits and impairments:     Visit Diagnosis: Lumbar radiculopathy  Cervicalgia  Other symptoms and signs involving the musculoskeletal system     Problem List Patient Active Problem List   Diagnosis Date Noted   Acute right-sided low back pain with right-sided sciatica 07/14/2021   Neck pain 07/14/2021   Sinus headache 06/22/2021   Allergic sinusitis 06/22/2021   Seasonal allergic  rhinitis due to pollen 06/22/2021   ADHD (attention deficit hyperactivity disorder), combined type 06/23/2020   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 (HCC) 06/23/2020   GAD (generalized anxiety disorder) 06/23/2020   Change in consistency of stool 05/03/2020   Skin  tag 05/03/2020   Depressed mood 01/14/2020   Irritable 01/14/2020   Non-restorative sleep 01/14/2020   Snoring 01/14/2020   Poor concentration 01/14/2020   Elevated blood pressure reading without diagnosis of hypertension 12/26/2019   Acute nonintractable headache 12/26/2019   Dizziness 12/26/2019   Anxiety 12/01/2019   Dyslipidemia, goal LDL below 70 10/28/2019   Injury of left ring finger 09/22/2019   Left fourth distal phalangeal exostosis 09/22/2019   Spinal stenosis 08/27/2019   Class 2 severe obesity due to excess calories with serious comorbidity and body mass index (BMI) of 35.0 to 35.9 in adult [facility]) 08/12/2019   Diabetes mellitus type II, controlled (HCC) 08/12/2019   Lumbar spinal stenosis 05/20/2019   Tendinitis of left hand 01/16/2014    Vanesa Renier Rober Minion, PT, MPH  08/29/2021, 4:35 PM  [facility] [address] [place], [place], [address] Phone: [phone]   Fax:  [phone]  Name: Kenneth Mcbride MRN: [id number] Date of Birth: 07-24-1985

## 2021-08-29 NOTE — Patient Instructions (Signed)
Access Code: [email or web address] URL: [email or web address] Date: 08/29/2021 Prepared by: Corlis Leak  Exercises - Standing Lumbar Extension  - 2 x daily - 7 x weekly - 1 sets - 2-3 reps - 2-3 sec  hold - Seated Hip Flexor Stretch  - 2 x daily - 7 x weekly - 1 sets - 3 reps - 30 sec  hold - Supine Cervical Retraction with Towel  - 2 x daily - 7 x weekly - 1 sets - 5-10 reps - 10 sec  hold - Seated Cervical Retraction  - 2 x daily - 7 x weekly - 1-2 sets - 5-10 reps - 10 sec  hold - Supine Transversus Abdominis Bracing with Pelvic Floor Contraction  - 2 x daily - 7 x weekly - 1 sets - 10 reps - 10sec  hold - Walking  - 2-3 x daily - 7 x weekly - Doorway Pec Stretch at 60 Degrees Abduction  - 3 x daily - 7 x weekly - 1 sets - 3 reps - Doorway Pec Stretch at 90 Degrees Abduction  - 3 x daily - 7 x weekly - 1 sets - 3 reps - 30 seconds  hold - Doorway Pec Stretch at 120 Degrees Abduction  - 3 x daily - 7 x weekly - 1 sets - 3 reps - 30 second hold  hold - Hooklying Hamstring Stretch with Strap  - 2 x daily - 7 x weekly - 1 sets - 3 reps - 30 sec  hold - Sit to Stand  - 2 x daily - 7 x weekly - 1 sets - 10 reps - 3-5 sec  hold - Wall Quarter Squat  - 2 x daily - 7 x weekly - 1-2 sets - 10 reps - 5-10 sec  hold - Shoulder External Rotation and Scapular Retraction with Resistance  - 2 x daily - 7 x weekly - 3 sets - 10 reps - Scapular Retraction with Resistance  - 2 x daily - 7 x weekly - 3 sets - 10 reps - Drawing Bow  - 1 x daily - 7 x weekly - 1 sets - 10 reps - 3 sec  hold - Left Standing Lateral Shift Correction at Wall - Repetitions  - 1 x daily - 7 x weekly - 2 sets - 10 reps - 5-10 sec hold - Standard Plank  - 1 x daily - 7 x weekly - 2 sets - 30 sec hold - Beginner Front Arm Support  - 1 x daily - 7 x weekly - 2 sets - 10 reps - Standing Anti-Rotation Press with Anchored Resistance  - 1 x daily - 7 x weekly - 2 sets - 10 reps - Step Taps on High Step  - 1 x daily - 7 x weekly - 2 sets -  10 reps - Standing Lat Pull Down with Resistance - Elbows Bent  - 2 x daily - 7 x weekly - 1 sets - 10 reps - 3 sec  hold - Seated Cervical Retraction  - 2 x daily - 7 x weekly - 1-2 sets - 5-10 reps - 10 sec  hold - Seated Cervical Sidebending AROM  - 2 x daily - 7 x weekly - 1 sets - 5 reps - 5-10 sec  hold - Seated Cervical Rotation AROM  - 2 x daily - 7 x weekly - 1 sets - 5 reps - 2-3 sec  hold - Prone Scapular Retraction  - 2 x daily - 7 x  weekly - 1 sets - 5-10 reps - 3-5 sec  hold - Seated Shoulder W  - 2 x daily - 7 x weekly - 1 sets - 10 reps - 3 sec  hold - Shoulder W - External Rotation with Resistance  - 2 x daily - 7 x weekly - 1-2 sets - 10 reps - 3 sec  hold - The Diver  - 1 x daily - 7 x weekly - 2-3 sets - 10 reps - 2-3 sec  hold - Forward Reach with Kettlebell  - 1 x daily - 7 x weekly - 2-3 sets - 10 reps - Standing 4-Way Leg Reach with Unilateral Counter Support  - 1 x daily - 7 x weekly - 2-3 sets - 10 reps - Lateral Step Up  - 1 x daily - 7 x weekly - 2-3 sets - 10 reps - 2 sec  hold -knee bend back foot resting step  10 reps x 2 sets

## 2021-09-01 ENCOUNTER — Ambulatory Visit: Payer: BC Managed Care – PPO | Admitting: Rehabilitative and Restorative Service Providers"

## 2021-09-01 ENCOUNTER — Encounter: Payer: Self-pay | Admitting: Rehabilitative and Restorative Service Providers"

## 2021-09-01 DIAGNOSIS — M5416 Radiculopathy, lumbar region: Secondary | ICD-10-CM

## 2021-09-01 DIAGNOSIS — M542 Cervicalgia: Secondary | ICD-10-CM | POA: Diagnosis not present

## 2021-09-01 DIAGNOSIS — R29898 Other symptoms and signs involving the musculoskeletal system: Secondary | ICD-10-CM | POA: Diagnosis not present

## 2021-09-01 NOTE — Therapy (Signed)
[facility] [place] [address] [place], [place], [address] Phone: [phone]   Fax:  [phone]  Physical Therapy Treatment  Rationale for Evaluation and Treatment Rehabilitation  Patient Details  Name: Kenneth Mcbride MRN: [id number] Date of Birth: December 01, 1985 Referring Provider (PT): Tandy Gaw, [place]   Encounter Date: 09/01/2021   PT End of Session - 09/01/21 1601     Visit Number 11    Number of Visits 24    Date for PT Re-Evaluation 10/06/21    PT Start Time 1522    PT Stop Time 1611    PT Time Calculation (min) 49 min    Activity Tolerance Patient tolerated treatment well             Past Medical History:  Diagnosis Date   Diabetes mellitus without complication (HCC)    Family history of adverse reaction to anesthesia    father and grand father both heart stopped during surgery   Hypertension    No pertinent past medical history    Spinal stenosis     Past Surgical History:  Procedure Laterality Date   LUMBAR LAMINECTOMY/DECOMPRESSION MICRODISCECTOMY N/A 08/27/2019   Procedure: LUMBAR 2-LUMBAR 5 DECOMPRESSION;  Surgeon: Estill Bamberg, MD;  Location: [facility] OR;  Service: Orthopedics;  Laterality: N/A;   NO PAST SURGERIES      There were no vitals filed for this visit.   Subjective Assessment - 09/01/21 1555     Subjective Patient reports that his knee 'gave way" last night when he was bowling and he twisted his back to keep from falling. Now having pain in the Lt mid back area.    Currently in Pain? Yes    Pain Score 5     Pain Location Back    Pain Orientation Left;Mid    Pain Descriptors / Indicators Aching;Tightness    Pain Type Acute pain                [facility] PT Assessment - 09/01/21 0001       Assessment   Medical Diagnosis LBP; radicular symptoms    Referring Provider (PT) Tandy Gaw, PA-C    Onset Date/Surgical Date 07/11/21    Hand Dominance Left    Next MD Visit none scheduled     Prior Therapy for LBP after surgery      Palpation   Palpation comment palpable tightness Lt/Rt thoracic  paraspinals; lats                           [facility] Adult PT Treatment/Exercise - 09/01/21 0001       Lumbar Exercises: Stretches   Other Lumbar Stretch Exercise child's pose x30 sec    Other Lumbar Stretch Exercise sitting foward trunk flexion to stretch through the Lt thoracic spine; lateral trunk fexion in sitting 20 sec hold x 2 reps each      Lumbar Exercises: Aerobic   Tread Mill 1.8 mph x 8 min warm up      Shoulder Exercises: Stretch   Other Shoulder Stretches doorway 3 positions 30 sec hold x 1 each position    Other Shoulder Stretches shoulder flexion hands over doorway 30 sec x 2 reps      Cryotherapy   Number Minutes Cryotherapy 15 Minutes    Cryotherapy Location Lumbar Spine   thoracolumbar   Type of Cryotherapy Ice pack      Electrical Stimulation   Electrical Stimulation Location Lt thoracolumbar  musculature    Electrical Stimulation Action TENS    Electrical Stimulation Parameters to tolerance    Electrical Stimulation Goals Pain;Tone      Manual Therapy   Manual therapy comments soft tissue work through the Lt thoracolumbar area in area of palpable tightness                     PT Education - 09/01/21 1606     Education Details TENS    Person(s) Educated Patient    Methods Explanation;Handout;Demonstration    Comprehension Verbalized understanding                 PT Long Term Goals - 08/25/21 1622       PT LONG TERM GOAL #1   Title Decrease pain in lumbar and cervical spine as well as radicular symptoms in bilat LE's by 50-75% allowing patient to return to normal functional activities    Time 6    Period Weeks    Status Achieved    Target Date 08/25/21      PT LONG TERM GOAL #2   Title Increase AROM through lumbar and cervical spine to WFL's with no pain or pain no > than 1 to 2/10 on 0/10 pain scale     Time 6    Period Weeks    Status On-going    Target Date 10/06/21      PT LONG TERM GOAL #3   Title Patient to demonatrate correct bady mechanics with functional activities including sitting and position for sleep    Time 6    Period Weeks    Status On-going    Target Date 10/06/21      PT LONG TERM GOAL #4   Title Independent in HEP (including aquatic therapy as indicated)    Time 6    Period Weeks    Status On-going    Target Date 10/06/21      PT LONG TERM GOAL #5   Baseline 08/23/21 - 68    Time 6    Period Weeks    Status On-going    Target Date 08/25/21                   Plan - 09/01/21 1603     Clinical Impression Statement Patient reports irritation to Lt thoracic spine last night while bowling. He felt his knee "give way" and twisted his back to keep from falling. He has had tightness and pain in the Lt thoracolumbar spine area. He has palpable tightness in the Lt thoracic paraspinals, lats, lower trap area. Trial of gentle stretching, manual work, TENS and ice to address acute pain Lt thoracic spine.    Rehab Potential Good    PT Frequency 2x / week    PT Duration 6 weeks    PT Treatment/Interventions ADLs/Self Care Home Management;Aquatic Therapy;Cryotherapy;Electrical Stimulation;Iontophoresis 4mg /ml Dexamethasone;Moist Heat;Ultrasound;Therapeutic activities;Functional mobility training;Therapeutic exercise;Balance training;Neuromuscular re-education;Patient/family education;Manual techniques;Passive range of motion;Dry needling;Taping    PT Next Visit Plan assess thoracic spine pain and response to intervention; continue DN and ex for cervical ROM/muscular tightness as indicated; review and progress exercise program for spinal stability and strengthening; postural education and correction; dynamic balance activities    PT Home Exercise Plan    Consulted and Agree with Plan of Care Patient             Patient will benefit from skilled  therapeutic intervention in order to improve the following deficits and impairments:  Visit Diagnosis: Lumbar radiculopathy  Cervicalgia  Other symptoms and signs involving the musculoskeletal system     Problem List Patient Active Problem List   Diagnosis Date Noted   Acute right-sided low back pain with right-sided sciatica 07/14/2021   Neck pain 07/14/2021   Sinus headache 06/22/2021   Allergic sinusitis 06/22/2021   Seasonal allergic rhinitis due to pollen 06/22/2021   ADHD (attention deficit hyperactivity disorder), combined type 06/23/2020   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 (HCC) 06/23/2020   GAD (generalized anxiety disorder) 06/23/2020   Change in consistency of stool 05/03/2020   Skin tag 05/03/2020   Depressed mood 01/14/2020   Irritable 01/14/2020   Non-restorative sleep 01/14/2020   Snoring 01/14/2020   Poor concentration 01/14/2020   Elevated blood pressure reading without diagnosis of hypertension 12/26/2019   Acute nonintractable headache 12/26/2019   Dizziness 12/26/2019   Anxiety 12/01/2019   Dyslipidemia, goal LDL below 70 10/28/2019   Injury of left ring finger 09/22/2019   Left fourth distal phalangeal exostosis 09/22/2019   Spinal stenosis 08/27/2019   Class 2 severe obesity due to excess calories with serious comorbidity and body mass index (BMI) of 35.0 to 35.9 in adult (HCC) 08/12/2019   Diabetes mellitus type II, controlled (HCC) 08/12/2019   Lumbar spinal stenosis 05/20/2019   Tendinitis of left hand 01/16/2014    Yosselin Zoeller Rober Minion, PT, MPH  09/01/2021, 4:07 PM  [facility] [address] [place], [place], [address] Phone: [phone]   Fax:  [phone]  Name: Demarie Hyneman MRN: [id number] Date of Birth: Nov 16, 1985

## 2021-09-01 NOTE — Patient Instructions (Signed)

## 2021-09-05 ENCOUNTER — Ambulatory Visit: Payer: BC Managed Care – PPO | Admitting: Rehabilitative and Restorative Service Providers"

## 2021-09-05 ENCOUNTER — Encounter: Payer: Self-pay | Admitting: Rehabilitative and Restorative Service Providers"

## 2021-09-05 DIAGNOSIS — M542 Cervicalgia: Secondary | ICD-10-CM

## 2021-09-05 DIAGNOSIS — M5416 Radiculopathy, lumbar region: Secondary | ICD-10-CM | POA: Diagnosis not present

## 2021-09-05 DIAGNOSIS — R29898 Other symptoms and signs involving the musculoskeletal system: Secondary | ICD-10-CM

## 2021-09-05 NOTE — Patient Instructions (Signed)
Access Code: [email or web address] URL: [email or web address] Date: 09/05/2021 Prepared by: Corlis Leak  Exercises - Standing Lumbar Extension  - 2 x daily - 7 x weekly - 1 sets - 2-3 reps - 2-3 sec  hold - Seated Hip Flexor Stretch  - 2 x daily - 7 x weekly - 1 sets - 3 reps - 30 sec  hold - Supine Cervical Retraction with Towel  - 2 x daily - 7 x weekly - 1 sets - 5-10 reps - 10 sec  hold - Seated Cervical Retraction  - 2 x daily - 7 x weekly - 1-2 sets - 5-10 reps - 10 sec  hold - Supine Transversus Abdominis Bracing with Pelvic Floor Contraction  - 2 x daily - 7 x weekly - 1 sets - 10 reps - 10sec  hold - Walking  - 2-3 x daily - 7 x weekly - Doorway Pec Stretch at 60 Degrees Abduction  - 3 x daily - 7 x weekly - 1 sets - 3 reps - Doorway Pec Stretch at 90 Degrees Abduction  - 3 x daily - 7 x weekly - 1 sets - 3 reps - 30 seconds  hold - Doorway Pec Stretch at 120 Degrees Abduction  - 3 x daily - 7 x weekly - 1 sets - 3 reps - 30 second hold  hold - Hooklying Hamstring Stretch with Strap  - 2 x daily - 7 x weekly - 1 sets - 3 reps - 30 sec  hold - Sit to Stand  - 2 x daily - 7 x weekly - 1 sets - 10 reps - 3-5 sec  hold - Wall Quarter Squat  - 2 x daily - 7 x weekly - 1-2 sets - 10 reps - 5-10 sec  hold - Shoulder External Rotation and Scapular Retraction with Resistance  - 2 x daily - 7 x weekly - 3 sets - 10 reps - Scapular Retraction with Resistance  - 2 x daily - 7 x weekly - 3 sets - 10 reps - Drawing Bow  - 1 x daily - 7 x weekly - 1 sets - 10 reps - 3 sec  hold - Left Standing Lateral Shift Correction at Wall - Repetitions  - 1 x daily - 7 x weekly - 2 sets - 10 reps - 5-10 sec hold - Standard Plank  - 1 x daily - 7 x weekly - 2 sets - 30 sec hold - Beginner Front Arm Support  - 1 x daily - 7 x weekly - 2 sets - 10 reps - Standing Anti-Rotation Press with Anchored Resistance  - 1 x daily - 7 x weekly - 2 sets - 10 reps - Step Taps on High Step  - 1 x daily - 7 x weekly - 2 sets -  10 reps - Standing Lat Pull Down with Resistance - Elbows Bent  - 2 x daily - 7 x weekly - 1 sets - 10 reps - 3 sec  hold - Seated Cervical Retraction  - 2 x daily - 7 x weekly - 1-2 sets - 5-10 reps - 10 sec  hold - Seated Cervical Sidebending AROM  - 2 x daily - 7 x weekly - 1 sets - 5 reps - 5-10 sec  hold - Seated Cervical Rotation AROM  - 2 x daily - 7 x weekly - 1 sets - 5 reps - 2-3 sec  hold - Prone Scapular Retraction  - 2 x daily - 7 x  weekly - 1 sets - 5-10 reps - 3-5 sec  hold - Seated Shoulder W  - 2 x daily - 7 x weekly - 1 sets - 10 reps - 3 sec  hold - Shoulder W - External Rotation with Resistance  - 2 x daily - 7 x weekly - 1-2 sets - 10 reps - 3 sec  hold - The Diver  - 1 x daily - 7 x weekly - 2-3 sets - 10 reps - 2-3 sec  hold - Forward Reach with Kettlebell  - 1 x daily - 7 x weekly - 2-3 sets - 10 reps - Standing 4-Way Leg Reach with Unilateral Counter Support  - 1 x daily - 7 x weekly - 2-3 sets - 10 reps - Lateral Step Up  - 1 x daily - 7 x weekly - 2-3 sets - 10 reps - 2 sec  hold - Side Stepping with Resistance at Thighs  - 1 x daily - 7 x weekly

## 2021-09-05 NOTE — Therapy (Signed)
[facility] [place] [address] [place], [place], [address] Phone: [phone]   Fax:  [phone]  Physical Therapy Treatment  Rationale for Evaluation and Treatment Rehabilitation  Patient Details  Name: Kenneth Mcbride MRN: [id number] Date of Birth: [birth year] Referring Provider (PT): Tandy Gaw, [place]   Encounter Date: 09/05/2021   PT End of Session - 09/05/21 1529     Visit Number 12    Number of Visits 24    Date for PT Re-Evaluation 10/06/21    PT Start Time 1527    PT Stop Time 1615    PT Time Calculation (min) 48 min    Activity Tolerance Patient tolerated treatment well             Past Medical History:  Diagnosis Date   Diabetes mellitus without complication (HCC)    Family history of adverse reaction to anesthesia    father and grand father both heart stopped during surgery   Hypertension    No pertinent past medical history    Spinal stenosis     Past Surgical History:  Procedure Laterality Date   LUMBAR LAMINECTOMY/DECOMPRESSION MICRODISCECTOMY N/A 08/27/2019   Procedure: LUMBAR 2-LUMBAR 5 DECOMPRESSION;  Surgeon: Estill Bamberg, MD;  Location: [facility] OR;  Service: Orthopedics;  Laterality: N/A;   NO PAST SURGERIES      There were no vitals filed for this visit.   Subjective Assessment - 09/05/21 1529     Subjective Patient reports that he has no pain in mid back area. He has some tingling in the foot. Now driving to and from work 1.5 hours one way for the next several days. He is working on his HEP.    Currently in Pain? No/denies    Pain Score 0-No pain                [facility] PT Assessment - 09/05/21 0001       Assessment   Medical Diagnosis LBP; radicular symptoms    Referring Provider (PT) Tandy Gaw, PA-C    Onset Date/Surgical Date 07/11/21    Hand Dominance Left    Next MD Visit none scheduled    Prior Therapy for LBP after surgery      Sensation   Additional Comments  tingling Rt foot intermittent but daily      Strength   Overall Strength Comments decreased core strength in wt bearing functional activities including exercises                           [facility] Adult PT Treatment/Exercise - 09/05/21 0001       Lumbar Exercises: Aerobic   Tread Mill 2.2 mph x 8 min warm up      Lumbar Exercises: Standing   Lifting Limitations overhead press with bamboo pole two 5# wts suspended with red TB both hands 10 reps x 2 sets; single arm 5 reps x 2 sets    Row Strengthening;Both;Theraband;20 reps    Theraband Level (Row) Level 4 (Blue)    Row Limitations bow and arrow 10 reps x 2 sets    Shoulder Adduction Limitations lat pull blue TB x 10 reps x 2 sets    Other Standing Lumbar Exercises antirotation blue TB x 10 x 2 sets each side; added shoudler flexion in extended arms position blue TB x 10 reps each side    Other Standing Lumbar Exercises SLS 10-12 sec x 5 reps each LE; SLS with  tapping fwd/side/back/behind x 10 x 2 sets each LE; diver x 10 reps x 2 sets each LE touching the back of the chair      Shoulder Exercises: Stretch   Other Shoulder Stretches doorway 3 positions 30 sec hold x 1 each position    Other Shoulder Stretches shoulder flexion hands over doorway 30 sec x 2 reps                     PT Education - 09/05/21 1612     Education Details HEP    Person(s) Educated Patient    Methods Explanation;Demonstration;Tactile cues;Verbal cues;Handout    Comprehension Verbalized understanding;Returned demonstration;Verbal cues required;Tactile cues required                 PT Long Term Goals - 08/25/21 1622       PT LONG TERM GOAL #1   Title Decrease pain in lumbar and cervical spine as well as radicular symptoms in bilat LE's by 50-75% allowing patient to return to normal functional activities    Time 6    Period Weeks    Status Achieved    Target Date 08/25/21      PT LONG TERM GOAL #2   Title Increase AROM  through lumbar and cervical spine to WFL's with no pain or pain no > than 1 to 2/10 on 0/10 pain scale    Time 6    Period Weeks    Status On-going    Target Date 10/06/21      PT LONG TERM GOAL #3   Title Patient to demonatrate correct bady mechanics with functional activities including sitting and position for sleep    Time 6    Period Weeks    Status On-going    Target Date 10/06/21      PT LONG TERM GOAL #4   Title Independent in HEP (including aquatic therapy as indicated)    Time 6    Period Weeks    Status On-going    Target Date 10/06/21      PT LONG TERM GOAL #5   Baseline 08/23/21 - 68    Time 6    Period Weeks    Status On-going    Target Date 08/25/21                   Plan - 09/05/21 1532     Clinical Impression Statement Resolution of Lt thoracic pain since last visit. Patient has restarted HEP and is doing well with current exercises. Working today on core stability and LE strengthening.    Rehab Potential Good    PT Frequency 2x / week    PT Duration 6 weeks    PT Treatment/Interventions ADLs/Self Care Home Management;Aquatic Therapy;Cryotherapy;Electrical Stimulation;Iontophoresis 4mg /ml Dexamethasone;Moist Heat;Ultrasound;Therapeutic activities;Functional mobility training;Therapeutic exercise;Balance training;Neuromuscular re-education;Patient/family education;Manual techniques;Passive range of motion;Dry needling;Taping    PT Next Visit Plan continue DN and ex for cervical ROM/muscular tightness as indicated; review and progress exercise program for spinal stability and strengthening; postural education and correction; dynamic balance activities    PT Home Exercise Plan [phone]             Patient will benefit from skilled therapeutic intervention in order to improve the following deficits and impairments:     Visit Diagnosis: Lumbar radiculopathy  Cervicalgia  Other symptoms and signs involving the musculoskeletal  system     Problem List Patient Active Problem List   Diagnosis Date Noted   Acute  right-sided low back pain with right-sided sciatica 07/14/2021   Neck pain 07/14/2021   Sinus headache 06/22/2021   Allergic sinusitis 06/22/2021   Seasonal allergic rhinitis due to pollen 06/22/2021   ADHD (attention deficit hyperactivity disorder), combined type 06/23/2020   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 (HCC) 06/23/2020   GAD (generalized anxiety disorder) 06/23/2020   Change in consistency of stool 05/03/2020   Skin tag 05/03/2020   Depressed mood 01/14/2020   Irritable 01/14/2020   Non-restorative sleep 01/14/2020   Snoring 01/14/2020   Poor concentration 01/14/2020   Elevated blood pressure reading without diagnosis of hypertension 12/26/2019   Acute nonintractable headache 12/26/2019   Dizziness 12/26/2019   Anxiety 12/01/2019   Dyslipidemia, goal LDL below 70 10/28/2019   Injury of left ring finger 09/22/2019   Left fourth distal phalangeal exostosis 09/22/2019   Spinal stenosis 08/27/2019   Class 2 severe obesity due to excess calories with serious comorbidity and body mass index (BMI) of 35.0 to 35.9 in adult [facility]) 08/12/2019   Diabetes mellitus type II, controlled (HCC) 08/12/2019   Lumbar spinal stenosis 05/20/2019   Tendinitis of left hand 01/16/2014    Derold Dorsch Rober Minion, PT, MPH  09/05/2021, 4:20 PM  [facility] [address] [place], [place], [address] Phone: [phone]   Fax:  [phone]  Name: Treyson Axel MRN: [id number] Date of Birth: 09/28/1985

## 2021-09-08 ENCOUNTER — Encounter: Payer: Self-pay | Admitting: Rehabilitative and Restorative Service Providers"

## 2021-09-12 ENCOUNTER — Encounter: Payer: Self-pay | Admitting: Rehabilitative and Restorative Service Providers"

## 2021-09-15 ENCOUNTER — Ambulatory Visit: Payer: BC Managed Care – PPO | Admitting: Rehabilitative and Restorative Service Providers"

## 2021-09-15 ENCOUNTER — Encounter: Payer: Self-pay | Admitting: Rehabilitative and Restorative Service Providers"

## 2021-09-15 DIAGNOSIS — R29898 Other symptoms and signs involving the musculoskeletal system: Secondary | ICD-10-CM

## 2021-09-15 DIAGNOSIS — M542 Cervicalgia: Secondary | ICD-10-CM

## 2021-09-15 DIAGNOSIS — M5416 Radiculopathy, lumbar region: Secondary | ICD-10-CM | POA: Diagnosis not present

## 2021-09-15 NOTE — Therapy (Signed)
[facility] [place] [address] [place], [place], [address] Phone: [phone]   Fax:  [phone]  Physical Therapy Treatment Rationale for Evaluation and Treatment Rehabilitation  Patient Details  Name: Kenneth Mcbride MRN: [id number] Date of Birth: [birth year] Referring Provider (PT): Tandy Gaw, [place]   Encounter Date: 09/15/2021   PT End of Session - 09/15/21 1532     Visit Number 13    Number of Visits 24    Date for PT Re-Evaluation 10/06/21    PT Start Time 1525    PT Stop Time 1614    PT Time Calculation (min) 49 min    Activity Tolerance Patient tolerated treatment well             Past Medical History:  Diagnosis Date   Diabetes mellitus without complication (HCC)    Family history of adverse reaction to anesthesia    father and grand father both heart stopped during surgery   Hypertension    No pertinent past medical history    Spinal stenosis     Past Surgical History:  Procedure Laterality Date   LUMBAR LAMINECTOMY/DECOMPRESSION MICRODISCECTOMY N/A 08/27/2019   Procedure: LUMBAR 2-LUMBAR 5 DECOMPRESSION;  Surgeon: Estill Bamberg, MD;  Location: [facility] OR;  Service: Orthopedics;  Laterality: N/A;   NO PAST SURGERIES      There were no vitals filed for this visit.   Subjective Assessment - 09/15/21 1533     Subjective Patient reports that he has been very busy with work. He doing a lot of driving to work (1.5 hours one way) because his coworker is out. He has had difficulty fitting in his exercises but has remained active.    Currently in Pain? No/denies    Pain Score 0-No pain    Pain Location Back    Pain Orientation Right;Mid    Pain Descriptors / Indicators Aching;Tightness    Pain Type Acute pain    Pain Onset More than a month ago    Pain Frequency Intermittent                [facility] PT Assessment - 09/15/21 0001       Assessment   Medical Diagnosis LBP; radicular symptoms     Referring Provider (PT) Tandy Gaw, PA-C    Onset Date/Surgical Date 07/11/21    Hand Dominance Left    Next MD Visit none scheduled    Prior Therapy for LBP after surgery      Sensation   Additional Comments tingling Rt foot intermittent but daily      AROM   Overall AROM Comments bilat hip mobility limited and tight in all planes; cervical ROM limited and tight    AROM Assessment Site --   improving spinal mobilty and ROM throughout     Strength   Overall Strength Comments decreased core strength in wt bearing functional activities including exercises                           [facility] Adult PT Treatment/Exercise - 09/15/21 0001       Lumbar Exercises: Aerobic   Tread Mill 2.2 mph x 8 min warm up      Lumbar Exercises: Machines for Strengthening   Leg Press 9 plates 10 x 3 reps; heel raise 9 plates 10 x 3 sets      Lumbar Exercises: Standing   Lifting Limitations overhead press with bamboo pole two 2.5# wts suspended  with red TB both hands 10 reps x 2 sets; single arm carry 20 ft x 6    Other Standing Lumbar Exercises SLS 10-12 sec x 5 reps each LE; SLS with tapping fwd/side/back/behind x 10 x 2 sets each LE; diver x 10 reps x 2 sets each LE touching table ~ 24 in height      Shoulder Exercises: Standing   Other Standing Exercises counter plank 60-90 sec hold x 2 reps      Shoulder Exercises: Stretch   Other Shoulder Stretches doorway 3 positions 30 sec hold x 1 each position    Other Shoulder Stretches shoulder flexion hands over doorway 30 sec x 2 reps      Shoulder Exercises: Body Blade   Flexion 60 seconds;2 reps    Other Body Blade Exercises bilat fowrard waist to overhead 1 min x 2 reps                          PT Long Term Goals - 08/25/21 1622       PT LONG TERM GOAL #1   Title Decrease pain in lumbar and cervical spine as well as radicular symptoms in bilat LE's by 50-75% allowing patient to return to normal functional  activities    Time 6    Period Weeks    Status Achieved    Target Date 08/25/21      PT LONG TERM GOAL #2   Title Increase AROM through lumbar and cervical spine to WFL's with no pain or pain no > than 1 to 2/10 on 0/10 pain scale    Time 6    Period Weeks    Status On-going    Target Date 10/06/21      PT LONG TERM GOAL #3   Title Patient to demonatrate correct bady mechanics with functional activities including sitting and position for sleep    Time 6    Period Weeks    Status On-going    Target Date 10/06/21      PT LONG TERM GOAL #4   Title Independent in HEP (including aquatic therapy as indicated)    Time 6    Period Weeks    Status On-going    Target Date 10/06/21      PT LONG TERM GOAL #5   Baseline 08/23/21 - 68    Time 6    Period Weeks    Status On-going    Target Date 08/25/21                   Plan - 09/15/21 1538     Clinical Impression Statement Patient has had less time for exercises and strengthening but has continued to be more active. He worked well in therapy and increased resistive exercises.    Rehab Potential Good    PT Frequency 2x / week    PT Duration 6 weeks    PT Treatment/Interventions ADLs/Self Care Home Management;Aquatic Therapy;Cryotherapy;Electrical Stimulation;Iontophoresis 4mg /ml Dexamethasone;Moist Heat;Ultrasound;Therapeutic activities;Functional mobility training;Therapeutic exercise;Balance training;Neuromuscular re-education;Patient/family education;Manual techniques;Passive range of motion;Dry needling;Taping    PT Next Visit Plan continue to review and progress exercise program for spinal stability and strengthening; postural education and correction; dynamic balance activities    PT Home Exercise Plan    Consulted and Agree with Plan of Care Patient             Patient will benefit from skilled therapeutic intervention in order to improve the following  deficits and impairments:     Visit  Diagnosis: Lumbar radiculopathy  Cervicalgia  Other symptoms and signs involving the musculoskeletal system     Problem List Patient Active Problem List   Diagnosis Date Noted   Acute right-sided low back pain with right-sided sciatica 07/14/2021   Neck pain 07/14/2021   Sinus headache 06/22/2021   Allergic sinusitis 06/22/2021   Seasonal allergic rhinitis due to pollen 06/22/2021   ADHD (attention deficit hyperactivity disorder), combined type 06/23/2020   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 (HCC) 06/23/2020   GAD (generalized anxiety disorder) 06/23/2020   Change in consistency of stool 05/03/2020   Skin tag 05/03/2020   Depressed mood 01/14/2020   Irritable 01/14/2020   Non-restorative sleep 01/14/2020   Snoring 01/14/2020   Poor concentration 01/14/2020   Elevated blood pressure reading without diagnosis of hypertension 12/26/2019   Acute nonintractable headache 12/26/2019   Dizziness 12/26/2019   Anxiety 12/01/2019   Dyslipidemia, goal LDL below 70 10/28/2019   Injury of left ring finger 09/22/2019   Left fourth distal phalangeal exostosis 09/22/2019   Spinal stenosis 08/27/2019   Class 2 severe obesity due to excess calories with serious comorbidity and body mass index (BMI) of 35.0 to 35.9 in adult (HCC) 08/12/2019   Diabetes mellitus type II, controlled (HCC) 08/12/2019   Lumbar spinal stenosis 05/20/2019   Tendinitis of left hand 01/16/2014    Camile Esters Rober Minion, PT, MPH  09/15/2021, 4:15 PM  [facility] [address] [place], [place], [address] Phone: [phone]   Fax:  [phone]  Name: Kenneth Mcbride MRN: [id number] Date of Birth: Apr 15, 1985

## 2021-09-20 ENCOUNTER — Ambulatory Visit: Payer: BC Managed Care – PPO | Admitting: Physical Therapy

## 2021-09-20 ENCOUNTER — Encounter: Payer: Self-pay | Admitting: Physical Therapy

## 2021-09-20 DIAGNOSIS — M542 Cervicalgia: Secondary | ICD-10-CM | POA: Diagnosis not present

## 2021-09-20 DIAGNOSIS — M5416 Radiculopathy, lumbar region: Secondary | ICD-10-CM

## 2021-09-20 DIAGNOSIS — R29898 Other symptoms and signs involving the musculoskeletal system: Secondary | ICD-10-CM | POA: Diagnosis not present

## 2021-09-20 NOTE — Therapy (Signed)
[facility] [place] [address] [place], [place], [address] Phone: [phone]   Fax:  [phone]  Physical Therapy Treatment  Patient Details  Name: Kenneth Mcbride MRN: [id number] Date of Birth: 1985/05/25 Referring Provider (PT): Tandy Gaw, [place]   Encounter Date: 09/20/2021   PT End of Session - 09/20/21 1617     Visit Number 14    Number of Visits 24    Date for PT Re-Evaluation 10/06/21    PT Start Time 1617    PT Stop Time 1700    PT Time Calculation (min) 43 min    Activity Tolerance Patient tolerated treatment well    Behavior During Therapy [facility] for tasks assessed/performed             Past Medical History:  Diagnosis Date   Diabetes mellitus without complication (HCC)    Family history of adverse reaction to anesthesia    father and grand father both heart stopped during surgery   Hypertension    No pertinent past medical history    Spinal stenosis     Past Surgical History:  Procedure Laterality Date   LUMBAR LAMINECTOMY/DECOMPRESSION MICRODISCECTOMY N/A 08/27/2019   Procedure: LUMBAR 2-LUMBAR 5 DECOMPRESSION;  Surgeon: Estill Bamberg, MD;  Location: [facility] OR;  Service: Orthopedics;  Laterality: N/A;   NO PAST SURGERIES      There were no vitals filed for this visit.   Subjective Assessment - 09/20/21 1619     Subjective Pt reports nothing new or different. Continued decrease in N/T.    Pertinent History lumbar surgery 08/27/19 for disectomy and laminectomy after 3 months of increased LBP and Rt LE pain and tingling with good resolution followng surgery; intermittent LBP pain for ~ 10 years; HTN; AODM    Patient Stated Goals get rid of pain and tingling    Currently in Pain? No/denies    Pain Onset More than a month ago                [facility] PT Assessment - 09/20/21 0001       Assessment   Medical Diagnosis LBP; radicular symptoms    Referring Provider (PT) Tandy Gaw, PA-C    Onset  Date/Surgical Date 07/11/21    Hand Dominance Left    Next MD Visit none scheduled    Prior Therapy for LBP after surgery                           [facility] Adult PT Treatment/Exercise - 09/20/21 0001       Lumbar Exercises: Aerobic   Tread Mill 2.2 mph x 8 min warm up      Lumbar Exercises: Machines for Strengthening   Leg Press 10 plates 10 x 3 sets; 10 plates heel raise 10x3 sets      Lumbar Exercises: Standing   Lifting Limitations squat touch + overhead press with bamboo pole two 2.5# wts suspended with red TB both hands 10 reps x 3 sets; deadlifts with bamboo pole two 2.5# wts suspended 10 reps x 3 sets    Other Standing Lumbar Exercises antirotation blue TB doubled x 10 x 2 sets each side    Other Standing Lumbar Exercises SLS 10-12 sec x 5 reps each LE      Shoulder Exercises: Stretch   Other Shoulder Stretches doorway 3 positions 30 sec hold x 2 each position    Other Shoulder Stretches shoulder flexion hands over doorway  30 sec x 2 reps                          PT Long Term Goals - 08/25/21 1622       PT LONG TERM GOAL #1   Title Decrease pain in lumbar and cervical spine as well as radicular symptoms in bilat LE's by 50-75% allowing patient to return to normal functional activities    Time 6    Period Weeks    Status Achieved    Target Date 08/25/21      PT LONG TERM GOAL #2   Title Increase AROM through lumbar and cervical spine to WFL's with no pain or pain no > than 1 to 2/10 on 0/10 pain scale    Time 6    Period Weeks    Status On-going    Target Date 10/06/21      PT LONG TERM GOAL #3   Title Patient to demonatrate correct bady mechanics with functional activities including sitting and position for sleep    Time 6    Period Weeks    Status On-going    Target Date 10/06/21      PT LONG TERM GOAL #4   Title Independent in HEP (including aquatic therapy as indicated)    Time 6    Period Weeks    Status On-going     Target Date 10/06/21      PT LONG TERM GOAL #5   Baseline 08/23/21 - 68    Time 6    Period Weeks    Status On-going    Target Date 08/25/21                   Plan - 09/20/21 1651     Clinical Impression Statement Continued bilat LE and core strengthening. Worked on squats and deadlifts. Increased weight on leg press.    Personal Factors and Comorbidities Comorbidity 1;Comorbidity 2;Fitness    Comorbidities AAODM; HTN; sedentary lifestyle    Examination-Activity Limitations Sit;Sleep;Lift;Stand    Examination-Participation Restrictions Occupation;Driving;Yard Work    Rehab Potential Good    PT Frequency 2x / week    PT Duration 6 weeks    PT Treatment/Interventions ADLs/Self Care Home Management;Aquatic Therapy;Cryotherapy;Electrical Stimulation;Iontophoresis 4mg /ml Dexamethasone;Moist Heat;Ultrasound;Therapeutic activities;Functional mobility training;Therapeutic exercise;Balance training;Neuromuscular re-education;Patient/family education;Manual techniques;Passive range of motion;Dry needling;Taping    PT Next Visit Plan continue to review and progress exercise program for spinal stability and strengthening; postural education and correction; dynamic balance activities    PT Home Exercise Plan    Consulted and Agree with Plan of Care Patient             Patient will benefit from skilled therapeutic intervention in order to improve the following deficits and impairments:  Decreased range of motion, Decreased activity tolerance, Pain, Decreased balance, Hypomobility, Impaired flexibility, Improper body mechanics, Decreased mobility, Decreased strength, Impaired sensation, Postural dysfunction  Visit Diagnosis: Lumbar radiculopathy  Cervicalgia  Other symptoms and signs involving the musculoskeletal system     Problem List Patient Active Problem List   Diagnosis Date Noted   Acute right-sided low back pain with right-sided sciatica 07/14/2021   Neck  pain 07/14/2021   Sinus headache 06/22/2021   Allergic sinusitis 06/22/2021   Seasonal allergic rhinitis due to pollen 06/22/2021   ADHD (attention deficit hyperactivity disorder), combined type 06/23/2020   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 (HCC) 06/23/2020   GAD (generalized anxiety disorder) 06/23/2020  Change in consistency of stool 05/03/2020   Skin tag 05/03/2020   Depressed mood 01/14/2020   Irritable 01/14/2020   Non-restorative sleep 01/14/2020   Snoring 01/14/2020   Poor concentration 01/14/2020   Elevated blood pressure reading without diagnosis of hypertension 12/26/2019   Acute nonintractable headache 12/26/2019   Dizziness 12/26/2019   Anxiety 12/01/2019   Dyslipidemia, goal LDL below 70 10/28/2019   Injury of left ring finger 09/22/2019   Left fourth distal phalangeal exostosis 09/22/2019   Spinal stenosis 08/27/2019   Class 2 severe obesity due to excess calories with serious comorbidity and body mass index (BMI) of 35.0 to 35.9 in adult [facility]) 08/12/2019   Diabetes mellitus type II, controlled (HCC) 08/12/2019   Lumbar spinal stenosis 05/20/2019   Tendinitis of left hand 01/16/2014    [facility] April Dell Ponto, PT, DPT 09/20/2021, 4:56 PM  [facility] [address] [place], [place], [address] Phone: [phone]   Fax:  [phone]  Name: Kenneth Mcbride MRN: [id number] Date of Birth: 01-17-1986

## 2021-09-22 ENCOUNTER — Ambulatory Visit: Payer: BC Managed Care – PPO | Admitting: Rehabilitative and Restorative Service Providers"

## 2021-09-22 ENCOUNTER — Encounter: Payer: Self-pay | Admitting: Rehabilitative and Restorative Service Providers"

## 2021-09-22 DIAGNOSIS — M5416 Radiculopathy, lumbar region: Secondary | ICD-10-CM | POA: Diagnosis not present

## 2021-09-22 DIAGNOSIS — M542 Cervicalgia: Secondary | ICD-10-CM

## 2021-09-22 DIAGNOSIS — R29898 Other symptoms and signs involving the musculoskeletal system: Secondary | ICD-10-CM | POA: Diagnosis not present

## 2021-09-22 NOTE — Therapy (Addendum)
[place], [place], [address] Phone: [phone]   Fax:  [phone]  Physical Therapy Treatment and Discharge Summary  PHYSICAL THERAPY DISCHARGE SUMMARY  Visits from Start of Care: 15  Current functional level related to goals / functional outcomes:  Patient was progressing well with back rehab reporting that he was pain free. He continued to have weakness in bilat LE's. Would benefit from continued PT to address remaining deficits and accomplish goals. Kenneth Mcbride reports falling onto the Rt side sustaining multiple rib fracture and is now unable to continue with rehab. Patient will be limited with physical activity for ~ 6-8 weeks per MD appointment today.    Remaining deficits: See progress note for discharge status. Continued LE weakness.   Education / Equipment: HEP    Patient agrees to discharge. Patient goals were partially met. Patient is being discharged due to a change in medical status.  Kenneth Mcbride P. Helene Kelp PT, MPH 10/04/21 4:49 PM  Patient Details  Name: Kenneth Mcbride MRN: [id number] Date of Birth: [birth year] Referring Provider (PT): Iran Planas, [place]   Encounter Date: 09/22/2021   PT End of Session - 09/22/21 1618     Visit Number 15    Number of Visits 24    Date for PT Re-Evaluation 10/06/21    PT Start Time 5397    PT Stop Time 1700    PT Time Calculation (min) 46 min    Activity Tolerance Patient tolerated treatment well             Past Medical History:  Diagnosis Date   Diabetes mellitus without complication ([place])    Family history of adverse reaction to anesthesia    father and grand father both heart stopped during surgery   Hypertension    No pertinent past medical history    Spinal stenosis     Past Surgical History:  Procedure Laterality Date   LUMBAR LAMINECTOMY/DECOMPRESSION MICRODISCECTOMY N/A 08/27/2019   Procedure: LUMBAR 2-LUMBAR 5 DECOMPRESSION;   Surgeon: Phylliss Bob, MD;  Location: [facility];  Service: Orthopedics;  Laterality: N/A;   NO PAST SURGERIES      There were no vitals filed for this visit.  S: Patient reports that his legs are still sore from the last PT session.  He bowled last night and had a good game until he ran out of steam. Can tell he is making progress with decreased pain and increased strength.  Pain: 0/10      PT Long Term Goals - 08/25/21 1622       PT LONG TERM GOAL #1   Title Decrease pain in lumbar and cervical spine as well as radicular symptoms in bilat LE's by 50-75% allowing patient to return to normal functional activities    Time 6    Period Weeks    Status Achieved    Target Date 08/25/21      PT LONG TERM GOAL #2   Title Increase AROM through lumbar and cervical spine to WFL's with no pain or pain no > than 1 to 2/10 on 0/10 pain scale    Time 6    Period Weeks    Status On-going    Target Date 10/06/21      PT LONG TERM GOAL #3   Title Patient to demonatrate correct bady mechanics with functional activities including sitting and position for sleep    Time 6    Period Weeks    Status On-going  Target Date 10/06/21      PT LONG TERM GOAL #4   Title Independent in HEP (including aquatic therapy as indicated)    Time 6    Period Weeks    Status On-going    Target Date 10/06/21      PT LONG TERM GOAL #5   Baseline 08/23/21 - 68    Time 6    Period Weeks    Status On-going    Target Date 08/25/21             Treatment:  Treadmill 2.4 mph x 8 min  Doorway stretch 30 sec x 2 reps each position Shoulder flexion hands on top of doorway 30 sec x 2 reps  Antirotation blue TB 10 reps x 2 sets each side  Shoulder high row to ER to overhead press green 10 reps x 2 sets  Dead lift from floor 20# KB x 10 x 2 sets  Bow and arrow 10 x 2 sets each side blue TB Shoulder extension 10 x 2 sets blue TB Lat pull 10 x 2 sets black TB Body blade Lt/Rt UE flexion fwd; bilat waist to  overhead 1 min x 2 sets Overhead press w/ bamboo pole w/ 2.5 # wts suspended w/red TB 10 x 2 sets  Overhead one arm carry w/ bamboo pole w/ 2.5 # wts suspended 280 ft each UE  Counter plank 60 sec x 3 reps    Plan - 09/22/21 1640     Clinical Impression Statement Patient reports some soreness in LE's following last treatment. No pain. Progressing well with core stabilization and strengthening.    Rehab Potential Good    PT Frequency 2x / week    PT Duration 6 weeks    PT Treatment/Interventions ADLs/Self Care Home Management;Aquatic Therapy;Cryotherapy;Electrical Stimulation;Iontophoresis 14m/ml Dexamethasone;Moist Heat;Ultrasound;Therapeutic activities;Functional mobility training;Therapeutic exercise;Balance training;Neuromuscular re-education;Patient/family education;Manual techniques;Passive range of motion;Dry needling;Taping    PT Next Visit Plan continue to review and progress exercise program for spinal stability and strengthening; postural education and correction; dynamic balance activities    PT Home Exercise Plan [id number]   Consulted and Agree with Plan of Care Patient              Patient will benefit from skilled therapeutic intervention in order to improve the following deficits and impairments:     Visit Diagnosis: Lumbar radiculopathy  Cervicalgia  Other symptoms and signs involving the musculoskeletal system     Problem List Patient Active Problem List   Diagnosis Date Noted   Acute right-sided low back pain with right-sided sciatica 07/14/2021   Neck pain 07/14/2021   Sinus headache 06/22/2021   Allergic sinusitis 06/22/2021   Seasonal allergic rhinitis due to pollen 06/22/2021   ADHD (attention deficit hyperactivity disorder), combined type 06/23/2020   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 ([place] 06/23/2020   GAD (generalized anxiety disorder) 06/23/2020   Change in consistency of stool 05/03/2020   Skin tag 05/03/2020   Depressed  mood 01/14/2020   Irritable 01/14/2020   Non-restorative sleep 01/14/2020   Snoring 01/14/2020   Poor concentration 01/14/2020   Elevated blood pressure reading without diagnosis of hypertension 12/26/2019   Acute nonintractable headache 12/26/2019   Dizziness 12/26/2019   Anxiety 12/01/2019   Dyslipidemia, goal LDL below 70 10/28/2019   Injury of left ring finger 09/22/2019   Left fourth distal phalangeal exostosis 09/22/2019   Spinal stenosis 08/27/2019   Class 2 severe obesity due to excess calories with serious  comorbidity and body mass index (BMI) of 35.0 to 35.9 in adult [facility]) 08/12/2019   Diabetes mellitus type II, controlled ([place]) 08/12/2019   Lumbar spinal stenosis 05/20/2019   Tendinitis of left hand 01/16/2014    Plan - 09/22/21 1640     Clinical Impression Statement Patient reports some soreness in LE's following last treatment. No pain. Progressing well with core stabilization and strengthening.    Rehab Potential Good    PT Frequency 2x / week    PT Duration 6 weeks    PT Treatment/Interventions ADLs/Self Care Home Management;Aquatic Therapy;Cryotherapy;Electrical Stimulation;Iontophoresis 365m/ml Dexamethasone;Moist Heat;Ultrasound;Therapeutic activities;Functional mobility training;Therapeutic exercise;Balance training;Neuromuscular re-education;Patient/family education;Manual techniques;Passive range of motion;Dry needling;Taping    PT Next Visit Plan continue to review and progress exercise program for spinal stability and strengthening; postural education and correction; dynamic balance activities    PT Home Exercise Plan [id number]   Consulted and Agree with Plan of Care Patient             Plan - 09/22/21 1640     Clinical Impression Statement Patient reports some soreness in LE's following last treatment. No pain. Progressing well with core stabilization and strengthening.    Rehab Potential Good    PT Frequency 2x / week    PT Duration 6 weeks    PT  Treatment/Interventions ADLs/Self Care Home Management;Aquatic Therapy;Cryotherapy;Electrical Stimulation;Iontophoresis 479mml Dexamethasone;Moist Heat;Ultrasound;Therapeutic activities;Functional mobility training;Therapeutic exercise;Balance training;Neuromuscular re-education;Patient/family education;Manual techniques;Passive range of motion;Dry needling;Taping    PT Next Visit Plan continue to review and progress exercise program for spinal stability and strengthening; postural education and correction; dynamic balance activities    PT Home Exercise Plan [id number]  Consulted and Agree with Plan of Care Patient              Plan - 09/22/21 1640     Clinical Impression Statement Patient reports some soreness in LE's following last treatment. No pain. Progressing well with core stabilization and strengthening.    Rehab Potential Good    PT Frequency 2x / week    PT Duration 6 weeks    PT Treatment/Interventions ADLs/Self Care Home Management;Aquatic Therapy;Cryotherapy;Electrical Stimulation;Iontophoresis 65m15ml Dexamethasone;Moist Heat;Ultrasound;Therapeutic activities;Functional mobility training;Therapeutic exercise;Balance training;Neuromuscular re-education;Patient/family education;Manual techniques;Passive range of motion;Dry needling;Taping    PT Next Visit Plan continue to review and progress exercise program for spinal stability and strengthening; postural education and correction; dynamic balance activities    PT Home Exercise Plan [id number] Consulted and Agree with Plan of Care Patient              CelEverardo AllT, MPH 09/22/2021, 5:03 PM  [facility] [place]ld[id number]: [phone]:  [phone]: SteLance HuarachaN: [id number] of Birth: [birth year]

## 2021-09-27 ENCOUNTER — Ambulatory Visit: Payer: BC Managed Care – PPO | Admitting: Physical Therapy

## 2021-09-28 ENCOUNTER — Encounter: Payer: Self-pay | Admitting: Physician Assistant

## 2021-09-28 DIAGNOSIS — S2241XA Multiple fractures of ribs, right side, initial encounter for closed fracture: Secondary | ICD-10-CM | POA: Diagnosis not present

## 2021-09-28 DIAGNOSIS — S2020XA Contusion of thorax, unspecified, initial encounter: Secondary | ICD-10-CM | POA: Diagnosis not present

## 2021-09-28 NOTE — Telephone Encounter (Signed)
Not sure without listening to his lungs, direct questions to [facility] provider that saw him until we can assume care.

## 2021-09-29 ENCOUNTER — Encounter: Payer: Self-pay | Admitting: Physical Therapy

## 2021-10-04 ENCOUNTER — Ambulatory Visit (INDEPENDENT_AMBULATORY_CARE_PROVIDER_SITE_OTHER): Payer: BC Managed Care – PPO

## 2021-10-04 ENCOUNTER — Ambulatory Visit: Payer: BC Managed Care – PPO

## 2021-10-04 ENCOUNTER — Ambulatory Visit (INDEPENDENT_AMBULATORY_CARE_PROVIDER_SITE_OTHER): Payer: BC Managed Care – PPO | Admitting: Sports Medicine

## 2021-10-04 ENCOUNTER — Encounter: Payer: Self-pay | Admitting: Sports Medicine

## 2021-10-04 ENCOUNTER — Ambulatory Visit: Payer: BC Managed Care – PPO | Admitting: Rehabilitative and Restorative Service Providers"

## 2021-10-04 DIAGNOSIS — S2231XA Fracture of one rib, right side, initial encounter for closed fracture: Secondary | ICD-10-CM | POA: Insufficient documentation

## 2021-10-04 DIAGNOSIS — S2241XA Multiple fractures of ribs, right side, initial encounter for closed fracture: Secondary | ICD-10-CM

## 2021-10-04 DIAGNOSIS — M48062 Spinal stenosis, lumbar region with neurogenic claudication: Secondary | ICD-10-CM

## 2021-10-04 DIAGNOSIS — I951 Orthostatic hypotension: Secondary | ICD-10-CM | POA: Insufficient documentation

## 2021-10-04 DIAGNOSIS — M545 Low back pain, unspecified: Secondary | ICD-10-CM | POA: Diagnosis not present

## 2021-10-04 MED ORDER — HYDROCODONE-ACETAMINOPHEN 10-325 MG PO TABS: 1.0000 | ORAL_TABLET | Freq: Three times a day (TID) | ORAL | 0 refills | Status: DC | PRN

## 2021-10-04 MED ORDER — GABAPENTIN 300 MG PO CAPS: ORAL_CAPSULE | ORAL | 3 refills | Status: DC

## 2021-10-04 NOTE — Progress Notes (Signed)
    Procedures performed today:    None.  Independent interpretation of notes and tests performed by another provider:   None.  Brief History, Exam, Impression, and Recommendations:    Fracture of rib of right side Fall about a week and a half ago, was seen in urgent care where x-rays showed multiple right rib fractures. Lungs were inflated. Increasing pain medication to hydrocodone 10/325, getting updated x-rays, adding a rib belt. He understands this can hurt for 6 to 12 weeks.  Lumbar spinal stenosis Also having axial low back pain with radiation to the thighs with cramping, he does have known severe lumbar spinal stenosis, multiple levels but worse at L3-L4. He would like to restart gabapentin. Epidurals have not been tremendously effective. He did have a decompression with Dr. Yevette Edwards. Worsening leg weakness and balance, all likely from his lumbar spinal stenosis. If he does not get the sufficient relief from gabapentin he will updated imaging.  Orthostatic hypotension Needs to discuss this with PCP.    ____________________________________________ Kenneth Mcbride. Kenneth Mcbride, M.D., ABFM., CAQSM., AME. Primary Care and Sports Medicine [facility]  Adjunct Professor of Family Medicine  [place] [employer]  Restaurant manager, fast food

## 2021-10-04 NOTE — Assessment & Plan Note (Signed)
Needs to discuss this with PCP.

## 2021-10-04 NOTE — Assessment & Plan Note (Addendum)
Also having axial low back pain with radiation to the thighs with cramping, he does have known severe lumbar spinal stenosis, multiple levels but worse at L3-L4. He would like to restart gabapentin. Epidurals have not been tremendously effective. He did have a decompression with Dr. Yevette Edwards. Worsening leg weakness and balance, all likely from his lumbar spinal stenosis. If he does not get the sufficient relief from gabapentin he will updated imaging.

## 2021-10-04 NOTE — Assessment & Plan Note (Signed)
Fall about a week and a half ago, was seen in urgent care where x-rays showed multiple right rib fractures. Lungs were inflated. Increasing pain medication to hydrocodone 10/325, getting updated x-rays, adding a rib belt. He understands this can hurt for 6 to 12 weeks.

## 2021-10-05 ENCOUNTER — Encounter: Payer: Self-pay | Admitting: Sports Medicine

## 2021-10-06 ENCOUNTER — Encounter: Payer: Self-pay | Admitting: Physical Therapy

## 2021-11-02 ENCOUNTER — Ambulatory Visit (INDEPENDENT_AMBULATORY_CARE_PROVIDER_SITE_OTHER): Payer: BC Managed Care – PPO | Admitting: Sports Medicine

## 2021-11-02 ENCOUNTER — Encounter: Payer: Self-pay | Admitting: Sports Medicine

## 2021-11-02 DIAGNOSIS — S2241XD Multiple fractures of ribs, right side, subsequent encounter for fracture with routine healing: Secondary | ICD-10-CM | POA: Diagnosis not present

## 2021-11-02 DIAGNOSIS — M48062 Spinal stenosis, lumbar region with neurogenic claudication: Secondary | ICD-10-CM | POA: Diagnosis not present

## 2021-11-02 NOTE — Assessment & Plan Note (Signed)
This pleasant 36 year old male returns, he is now approximately 5 to 6 weeks post a fall, ended up with multiple right-sided rib fractures, we did some hydrocodone and rib belt and he is doing a lot better today, he understands it can take 6 to 12 weeks for full healing. No further intervention.

## 2021-11-02 NOTE — Progress Notes (Signed)
    Procedures performed today:    None.  Independent interpretation of notes and tests performed by another provider:   None.  Brief History, Exam, Impression, and Recommendations:    Fracture of rib of right side This pleasant 36 year old male returns, he is now approximately 5 to 6 weeks post a fall, ended up with multiple right-sided rib fractures, we did some hydrocodone and rib belt and he is doing a lot better today, he understands it can take 6 to 12 weeks for full healing. No further intervention.  Lumbar spinal stenosis Also has chronic axial low back pain with radiation to the thighs, known severe lumbar spinal stenosis worst at the L3-L4 level, he is post a decompression with Dr. Yevette Edwards. He restarted gabapentin, currently doing it twice daily, and is doing a lot better. Unfortunately still has some weakness in both knees, so due to this weakness we will proceed with an updated MRI.    ____________________________________________ Ihor Austin. Benjamin Stain, M.D., ABFM., CAQSM., AME. Primary Care and Sports Medicine [facility]  Adjunct Professor of Family Medicine  [employer] [employer] [facility] [facility] [employer]  Restaurant manager, fast food

## 2021-11-02 NOTE — Assessment & Plan Note (Signed)
Also has chronic axial low back pain with radiation to the thighs, known severe lumbar spinal stenosis worst at the L3-L4 level, he is post a decompression with Dr. Yevette Edwards. He restarted gabapentin, currently doing it twice daily, and is doing a lot better. Unfortunately still has some weakness in both knees, so due to this weakness we will proceed with an updated MRI.

## 2021-11-06 ENCOUNTER — Ambulatory Visit (INDEPENDENT_AMBULATORY_CARE_PROVIDER_SITE_OTHER): Payer: BC Managed Care – PPO

## 2021-11-06 DIAGNOSIS — M48061 Spinal stenosis, lumbar region without neurogenic claudication: Secondary | ICD-10-CM | POA: Diagnosis not present

## 2021-11-06 DIAGNOSIS — M48062 Spinal stenosis, lumbar region with neurogenic claudication: Secondary | ICD-10-CM | POA: Diagnosis not present

## 2021-11-06 DIAGNOSIS — M5126 Other intervertebral disc displacement, lumbar region: Secondary | ICD-10-CM | POA: Diagnosis not present

## 2021-11-08 ENCOUNTER — Encounter: Payer: Self-pay | Admitting: Sports Medicine

## 2021-11-21 DIAGNOSIS — M48062 Spinal stenosis, lumbar region with neurogenic claudication: Secondary | ICD-10-CM | POA: Diagnosis not present

## 2021-12-09 ENCOUNTER — Other Ambulatory Visit: Payer: Self-pay

## 2021-12-16 DIAGNOSIS — M5416 Radiculopathy, lumbar region: Secondary | ICD-10-CM | POA: Diagnosis not present

## 2021-12-29 DIAGNOSIS — E119 Type 2 diabetes mellitus without complications: Secondary | ICD-10-CM | POA: Diagnosis not present

## 2022-01-04 DIAGNOSIS — M5416 Radiculopathy, lumbar region: Secondary | ICD-10-CM | POA: Diagnosis not present

## 2022-01-23 DIAGNOSIS — M545 Low back pain, unspecified: Secondary | ICD-10-CM | POA: Diagnosis not present

## 2022-01-27 ENCOUNTER — Ambulatory Visit: Payer: BC Managed Care – PPO | Admitting: Physician Assistant

## 2022-01-27 DIAGNOSIS — M5416 Radiculopathy, lumbar region: Secondary | ICD-10-CM | POA: Diagnosis not present

## 2022-02-01 DIAGNOSIS — S9031XA Contusion of right foot, initial encounter: Secondary | ICD-10-CM | POA: Diagnosis not present

## 2022-02-02 ENCOUNTER — Encounter: Payer: Self-pay | Admitting: Physician Assistant

## 2022-02-07 ENCOUNTER — Ambulatory Visit (INDEPENDENT_AMBULATORY_CARE_PROVIDER_SITE_OTHER): Payer: BC Managed Care – PPO | Admitting: Physician Assistant

## 2022-02-07 ENCOUNTER — Encounter: Payer: Self-pay | Admitting: Physician Assistant

## 2022-02-07 VITALS — BP 154/88 | HR 106 | Ht 69.0 in | Wt 283.0 lb

## 2022-02-07 DIAGNOSIS — Z6841 Body Mass Index (BMI) 40.0 and over, adult: Secondary | ICD-10-CM

## 2022-02-07 DIAGNOSIS — M48062 Spinal stenosis, lumbar region with neurogenic claudication: Secondary | ICD-10-CM | POA: Diagnosis not present

## 2022-02-07 DIAGNOSIS — E1169 Type 2 diabetes mellitus with other specified complication: Secondary | ICD-10-CM

## 2022-02-07 DIAGNOSIS — M4807 Spinal stenosis, lumbosacral region: Secondary | ICD-10-CM | POA: Diagnosis not present

## 2022-02-07 DIAGNOSIS — E785 Hyperlipidemia, unspecified: Secondary | ICD-10-CM | POA: Diagnosis not present

## 2022-02-07 DIAGNOSIS — I10 Essential (primary) hypertension: Secondary | ICD-10-CM

## 2022-02-07 MED ORDER — TIRZEPATIDE 7.5 MG/0.5ML ~~LOC~~ SOAJ: 7.5000 mg | SUBCUTANEOUS | 1 refills | Status: DC

## 2022-02-07 MED ORDER — METFORMIN HCL 500 MG PO TABS: 500.0000 mg | ORAL_TABLET | Freq: Two times a day (BID) | ORAL | 3 refills | Status: DC

## 2022-02-07 MED ORDER — MOUNJARO 2.5 MG/0.5ML ~~LOC~~ SOAJ: 2.5000 mg | SUBCUTANEOUS | 0 refills | Status: DC

## 2022-02-07 MED ORDER — ROSUVASTATIN CALCIUM 10 MG PO TABS: 10.0000 mg | ORAL_TABLET | Freq: Every day | ORAL | 3 refills | Status: DC

## 2022-02-07 MED ORDER — LISINOPRIL 2.5 MG PO TABS: 2.5000 mg | ORAL_TABLET | Freq: Every day | ORAL | 0 refills | Status: DC

## 2022-02-07 NOTE — Progress Notes (Signed)
Established Patient Office Visit  Subjective   Patient ID: Perley Arthurs, male    DOB: 01/16/1986  Age: 36 y.o. MRN: [id number]  Chief Complaint  Patient presents with   Follow-up    HPI  Patient is a 36 year old male with history of type 2 diabetes, obesity, lumbar stenosis presenting to clinic to discuss weight loss options. He has tried phentermine in the past and would like to discuss restarting. He currently is not getting any exercise due to lower back pain and neuropathy. He recently fell two weeks ago when he was moving a TV. He has some bruising on his right foot and had some right leg soreness the following day. He is using a cane today. He feels like this caused a flare on lumbar stenosis.   He has stopped taking a lot of his medications because he wanted "a reset." The only medications he is currently taking is lyrica and metformin. He is not checking his sugars. He is scheduled for low back injections today to see if would help with symptoms.   .. Active Ambulatory Problems    Diagnosis Date Noted   Tendinitis of left hand 01/16/2014   Lumbar spinal stenosis 05/20/2019   Class 2 severe obesity due to excess calories with serious comorbidity and body mass index (BMI) of 35.0 to 35.9 in adult (HCC) 08/12/2019   Diabetes mellitus type II, controlled (HCC) 08/12/2019   Spinal stenosis 08/27/2019   Injury of left ring finger 09/22/2019   Left fourth distal phalangeal exostosis 09/22/2019   Dyslipidemia, goal LDL below 70 10/28/2019   Anxiety 12/01/2019   Elevated blood pressure reading without diagnosis of hypertension 12/26/2019   Acute nonintractable headache 12/26/2019   Dizziness 12/26/2019   Depressed mood 01/14/2020   Irritable 01/14/2020   Non-restorative sleep 01/14/2020   Snoring 01/14/2020   Poor concentration 01/14/2020   Change in consistency of stool 05/03/2020   Skin tag 05/03/2020   ADHD (attention deficit hyperactivity disorder), combined type  06/23/2020   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 (HCC) 06/23/2020   GAD (generalized anxiety disorder) 06/23/2020   Sinus headache 06/22/2021   Allergic sinusitis 06/22/2021   Seasonal allergic rhinitis due to pollen 06/22/2021   Acute right-sided low back pain with right-sided sciatica 07/14/2021   Neck pain 07/14/2021   Fracture of rib of right side 10/04/2021   Orthostatic hypotension 10/04/2021   Resolved Ambulatory Problems    Diagnosis Date Noted   Closed fracture of proximal phalanx of third finger of left hand 11/28/2013   Past Medical History:  Diagnosis Date   Diabetes mellitus without complication (HCC)    Family history of adverse reaction to anesthesia    Hypertension    No pertinent past medical history       Review of Systems  Musculoskeletal:  Positive for back pain and falls.  Neurological:  Positive for sensory change and weakness.  All other systems reviewed and are negative.     Objective:     BP (!) 154/88   Pulse (!) 106   Ht 5\' 9"  (1.753 m)   Wt 283 lb (128.4 kg)   SpO2 100%   BMI 41.79 kg/m  BP Readings from Last 3 Encounters:  02/07/22 (!) 154/88  11/02/21 124/70  10/04/21 128/86   Wt Readings from Last 3 Encounters:  02/07/22 283 lb (128.4 kg)  11/02/21 287 lb (130.2 kg)  10/04/21 290 lb (131.5 kg)      Physical Exam Cardiovascular:  Rate and Rhythm: Normal rate and regular rhythm.  Pulmonary:     Effort: Pulmonary effort is normal.     Breath sounds: Normal breath sounds.  Skin:    Comments: Cold lower extremities. Left foot diminished sensation. Bruising on right foot over 2nd-4th digit.  Neurological:     Mental Status: He is alert and oriented to person, place, and time.     Diabetic foot exam was performed with the following findings:   Intact posterior tibialis and dorsalis pedis pulses Bruising over right 2nd - 4th toe. Left sensation diminished. Right sensation intact        Assessment &  Plan:  Marland KitchenMarland KitchenShondale was seen today for follow-up.  Diagnoses and all orders for this visit:  Controlled type 2 diabetes mellitus with other specified complication, without long-term current use of insulin (HCC) -     tirzepatide (MOUNJARO) 2.5 MG/0.5ML Pen; Inject 2.5 mg into the skin once a week. -     tirzepatide (MOUNJARO) 7.5 MG/0.5ML Pen; Inject 7.5 mg into the skin once a week. -     metFORMIN (GLUCOPHAGE) 500 MG tablet; Take 1 tablet (500 mg total) by mouth 2 (two) times daily with a meal. -     lisinopril (ZESTRIL) 2.5 MG tablet; Take 1 tablet (2.5 mg total) by mouth daily.  Spinal stenosis of lumbar region with neurogenic claudication -     Ambulatory referral to Neurosurgery  Dyslipidemia -     rosuvastatin (CRESTOR) 10 MG tablet; Take 1 tablet (10 mg total) by mouth daily.  Class 3 severe obesity due to excess calories with serious comorbidity and body mass index (BMI) of 40.0 to 44.9 in adult (HCC)   A1C is in lab work drawn today Continue metformin Add mounjaro for DM and weight loss Continue on crestor BP not to goal, add back in lisinopril    Diabetes, obesity, and Weight Loss Management Discussed that since he is diabetic, he would be a good candidate for mounjaro instead of phentermine for weight loss. Greggory Keen will help control his blood sugars and have weight loss benefits. Discussed proper technique for injection pen and potential side effects. Discussed importance of restarting his other medications.  Diabetic foot exam completed today. Needs eye exam Labs completed today.  Spinal Lumbar Stenosis His next steroid injection is this afternoon. Continue lyrica. Discussed potentially increasing dose. Placed ambulatory referral to neurosurgery to get second opinion regarding a second operation.      Tandy Gaw, PA-C

## 2022-02-07 NOTE — Patient Instructions (Addendum)
Will get consult with Dr. Marikay Alar.  Start mounjaro 2.5mg  then increase to 7.5mg  after 4 weeks.  Follow up in 3 months.  Restart lisinopril for blood pressure

## 2022-02-08 LAB — LIPID PANEL W/REFLEX DIRECT LDL
Cholesterol: 265 mg/dL — ABNORMAL HIGH (ref <200)
HDL: 36 mg/dL — ABNORMAL LOW (ref >=40)
LDL Cholesterol (Calc): 179 mg/dL (calc) — ABNORMAL HIGH
Non-HDL Cholesterol (Calc): 229 mg/dL (calc) — ABNORMAL HIGH (ref <130)
Total CHOL/HDL Ratio: 7.4 (calc) — ABNORMAL HIGH (ref <5.0)
Triglycerides: 294 mg/dL — ABNORMAL HIGH (ref <150)

## 2022-02-08 LAB — HEMOGLOBIN A1C
Hgb A1c MFr Bld: 10.4 % of total Hgb — ABNORMAL HIGH (ref <5.7)
Mean Plasma Glucose: 252 mg/dL
eAG (mmol/L): 13.9 mmol/L

## 2022-02-08 LAB — COMPLETE METABOLIC PANEL WITHOUT GFR
AG Ratio: 2.4 (calc) (ref 1.0–2.5)
ALT: 61 U/L — ABNORMAL HIGH (ref 9–46)
AST: 39 U/L (ref 10–40)
Albumin: 4.7 g/dL (ref 3.6–5.1)
Alkaline phosphatase (APISO): 124 U/L (ref 36–130)
BUN: 15 mg/dL (ref 7–25)
CO2: 26 mmol/L (ref 20–32)
Calcium: 9.4 mg/dL (ref 8.6–10.3)
Chloride: 101 mmol/L (ref 98–110)
Creat: 0.76 mg/dL (ref 0.60–1.26)
Globulin: 2 g/dL (ref 1.9–3.7)
Glucose, Bld: 242 mg/dL — ABNORMAL HIGH (ref 65–99)
Potassium: 4.1 mmol/L (ref 3.5–5.3)
Sodium: 137 mmol/L (ref 135–146)
Total Bilirubin: 0.6 mg/dL (ref 0.2–1.2)
Total Protein: 6.7 g/dL (ref 6.1–8.1)
eGFR: 118 mL/min/1.73m2

## 2022-02-08 LAB — TSH: TSH: 3.6 m[IU]/L (ref 0.40–4.50)

## 2022-02-08 NOTE — Progress Notes (Signed)
Thyroid normal range.  A1C is very elevated. Starting Kenneth Mcbride is very necessary. You could be having a lot of symptoms from this.  You need to be on statin to lower your CV risk and get LDL to under 70. Are you ok with this?

## 2022-02-10 ENCOUNTER — Encounter: Payer: Self-pay | Admitting: Physician Assistant

## 2022-02-10 ENCOUNTER — Telehealth: Payer: Self-pay

## 2022-02-10 DIAGNOSIS — I10 Essential (primary) hypertension: Secondary | ICD-10-CM | POA: Insufficient documentation

## 2022-02-10 NOTE — Telephone Encounter (Signed)
Initiated Prior authorization TJQ:ZESPQZRA 2.5MG /0.5ML pen-injectors Via: Covermymeds Case/Key:[id number] Status: approved  as of 02/10/22 Reason:Effective from 02/10/2022 through 02/09/2023. Notified Pt via: Mychart called pt to notify   Initiated Prior authorization QTM:[id number] 7MG /0.5ML pen-injectors Via: Covermymeds Case/Key:[id number] Status: approved  as of 02/10/22 Reason:Effective from 02/10/2022 through 02/09/2023. Notified Pt via: Mychart  called pt to notify

## 2022-02-28 DIAGNOSIS — R292 Abnormal reflex: Secondary | ICD-10-CM | POA: Diagnosis not present

## 2022-02-28 DIAGNOSIS — M48062 Spinal stenosis, lumbar region with neurogenic claudication: Secondary | ICD-10-CM | POA: Diagnosis not present

## 2022-02-28 DIAGNOSIS — Z6839 Body mass index (BMI) 39.0-39.9, adult: Secondary | ICD-10-CM | POA: Diagnosis not present

## 2022-03-01 ENCOUNTER — Other Ambulatory Visit: Payer: Self-pay | Admitting: Neurological Surgery

## 2022-03-01 DIAGNOSIS — R292 Abnormal reflex: Secondary | ICD-10-CM

## 2022-03-06 ENCOUNTER — Other Ambulatory Visit: Payer: Self-pay | Admitting: Neurological Surgery

## 2022-03-06 ENCOUNTER — Other Ambulatory Visit: Payer: BC Managed Care – PPO

## 2022-03-06 ENCOUNTER — Ambulatory Visit
Admission: RE | Admit: 2022-03-06 | Discharge: 2022-03-06 | Disposition: A | Discharge status: Home / Self Care | Payer: BC Managed Care – PPO | Source: Ambulatory Visit | Attending: Neurological Surgery | Admitting: Neurological Surgery

## 2022-03-06 DIAGNOSIS — R292 Abnormal reflex: Secondary | ICD-10-CM

## 2022-03-06 DIAGNOSIS — M40204 Unspecified kyphosis, thoracic region: Secondary | ICD-10-CM | POA: Diagnosis not present

## 2022-03-06 DIAGNOSIS — M4802 Spinal stenosis, cervical region: Secondary | ICD-10-CM | POA: Diagnosis not present

## 2022-03-07 ENCOUNTER — Other Ambulatory Visit: Payer: Self-pay | Admitting: Neurological Surgery

## 2022-03-07 ENCOUNTER — Ambulatory Visit
Admission: RE | Admit: 2022-03-07 | Discharge: 2022-03-07 | Disposition: A | Discharge status: Home / Self Care | Payer: BC Managed Care – PPO | Source: Ambulatory Visit | Attending: Neurological Surgery | Admitting: Neurological Surgery

## 2022-03-07 DIAGNOSIS — G959 Disease of spinal cord, unspecified: Secondary | ICD-10-CM | POA: Diagnosis not present

## 2022-03-07 DIAGNOSIS — M4804 Spinal stenosis, thoracic region: Secondary | ICD-10-CM

## 2022-03-07 DIAGNOSIS — Z6838 Body mass index (BMI) 38.0-38.9, adult: Secondary | ICD-10-CM | POA: Diagnosis not present

## 2022-03-07 DIAGNOSIS — M40204 Unspecified kyphosis, thoracic region: Secondary | ICD-10-CM | POA: Diagnosis not present

## 2022-03-08 ENCOUNTER — Encounter: Payer: Self-pay | Admitting: Neurological Surgery

## 2022-03-08 DIAGNOSIS — M4804 Spinal stenosis, thoracic region: Secondary | ICD-10-CM

## 2022-03-09 ENCOUNTER — Other Ambulatory Visit: Payer: Self-pay

## 2022-03-09 ENCOUNTER — Other Ambulatory Visit: Payer: Self-pay | Admitting: Neurological Surgery

## 2022-03-09 ENCOUNTER — Encounter (HOSPITAL_COMMUNITY): Payer: Self-pay | Admitting: Neurological Surgery

## 2022-03-09 DIAGNOSIS — G959 Disease of spinal cord, unspecified: Secondary | ICD-10-CM | POA: Diagnosis not present

## 2022-03-09 DIAGNOSIS — Z6839 Body mass index (BMI) 39.0-39.9, adult: Secondary | ICD-10-CM | POA: Diagnosis not present

## 2022-03-09 NOTE — Progress Notes (Signed)
Spoke with pt for pre-op call. Pt is a type 2 diabetic. Last A1C was 10.4 a month ago. He has recently started taking Mounjaro, he states he takes it every Saturday. Last dose was 03/04/22. He states he does not check his blood sugar at home.  Instructed pt not to take Metformin in the AM.   Shower instructions given to pt and he voiced understanding.

## 2022-03-09 NOTE — Progress Notes (Signed)
   03/09/22 1550  OBSTRUCTIVE SLEEP APNEA  Have you ever been diagnosed with sleep apnea through a sleep study? No  Do you snore loudly (loud enough to be heard through closed doors)?  1  Do you often feel tired, fatigued, or sleepy during the daytime (such as falling asleep during driving or talking to someone)? 0  Do you have, or are you being treated for high blood pressure? 1  BMI more than 35 kg/m2? 1  Age > 50 (1-yes) 0  Neck circumference greater than:Male 16 inches or larger, Male 17inches or larger? 1  Male Gender (Yes=1) 1  Obstructive Sleep Apnea Score 5  Score 5 or greater  Results sent to PCP

## 2022-03-10 ENCOUNTER — Other Ambulatory Visit: Payer: Self-pay

## 2022-03-10 ENCOUNTER — Telehealth: Payer: Self-pay | Admitting: Neurology

## 2022-03-10 ENCOUNTER — Inpatient Hospital Stay (HOSPITAL_COMMUNITY)
Admission: AD | Disposition: A | Discharge status: Home Health | Payer: Self-pay | Source: Home / Self Care | Attending: Neurological Surgery

## 2022-03-10 ENCOUNTER — Ambulatory Visit (HOSPITAL_COMMUNITY): Payer: BC Managed Care – PPO | Admitting: Registered Nurse

## 2022-03-10 ENCOUNTER — Ambulatory Visit (HOSPITAL_COMMUNITY): Payer: BC Managed Care – PPO

## 2022-03-10 ENCOUNTER — Inpatient Hospital Stay (HOSPITAL_COMMUNITY)
Admission: AD | Admit: 2022-03-10 | Discharge: 2022-03-16 | DRG: 519 | Disposition: A | Discharge status: Home Health | Payer: BC Managed Care – PPO | Attending: Neurological Surgery | Admitting: Neurological Surgery

## 2022-03-10 ENCOUNTER — Encounter (HOSPITAL_COMMUNITY): Payer: Self-pay | Admitting: Neurological Surgery

## 2022-03-10 DIAGNOSIS — Z79899 Other long term (current) drug therapy: Secondary | ICD-10-CM

## 2022-03-10 DIAGNOSIS — I10 Essential (primary) hypertension: Secondary | ICD-10-CM | POA: Diagnosis present

## 2022-03-10 DIAGNOSIS — G9741 Accidental puncture or laceration of dura during a procedure: Secondary | ICD-10-CM | POA: Diagnosis not present

## 2022-03-10 DIAGNOSIS — R269 Unspecified abnormalities of gait and mobility: Secondary | ICD-10-CM | POA: Diagnosis not present

## 2022-03-10 DIAGNOSIS — Z888 Allergy status to other drugs, medicaments and biological substances status: Secondary | ICD-10-CM | POA: Diagnosis not present

## 2022-03-10 DIAGNOSIS — Z8616 Personal history of COVID-19: Secondary | ICD-10-CM | POA: Diagnosis not present

## 2022-03-10 DIAGNOSIS — M2578 Osteophyte, vertebrae: Secondary | ICD-10-CM | POA: Diagnosis present

## 2022-03-10 DIAGNOSIS — M488X4 Other specified spondylopathies, thoracic region: Secondary | ICD-10-CM | POA: Diagnosis present

## 2022-03-10 DIAGNOSIS — R292 Abnormal reflex: Secondary | ICD-10-CM | POA: Diagnosis not present

## 2022-03-10 DIAGNOSIS — M4804 Spinal stenosis, thoracic region: Secondary | ICD-10-CM | POA: Diagnosis present

## 2022-03-10 DIAGNOSIS — Z833 Family history of diabetes mellitus: Secondary | ICD-10-CM

## 2022-03-10 DIAGNOSIS — Z7984 Long term (current) use of oral hypoglycemic drugs: Secondary | ICD-10-CM

## 2022-03-10 DIAGNOSIS — E119 Type 2 diabetes mellitus without complications: Secondary | ICD-10-CM | POA: Diagnosis present

## 2022-03-10 DIAGNOSIS — K760 Fatty (change of) liver, not elsewhere classified: Secondary | ICD-10-CM | POA: Diagnosis not present

## 2022-03-10 DIAGNOSIS — Z825 Family history of asthma and other chronic lower respiratory diseases: Secondary | ICD-10-CM | POA: Diagnosis not present

## 2022-03-10 DIAGNOSIS — Z8249 Family history of ischemic heart disease and other diseases of the circulatory system: Secondary | ICD-10-CM

## 2022-03-10 DIAGNOSIS — M4714 Other spondylosis with myelopathy, thoracic region: Principal | ICD-10-CM | POA: Diagnosis present

## 2022-03-10 DIAGNOSIS — F418 Other specified anxiety disorders: Secondary | ICD-10-CM | POA: Diagnosis not present

## 2022-03-10 DIAGNOSIS — Z6839 Body mass index (BMI) 39.0-39.9, adult: Secondary | ICD-10-CM

## 2022-03-10 DIAGNOSIS — G478 Other sleep disorders: Secondary | ICD-10-CM

## 2022-03-10 DIAGNOSIS — Z0389 Encounter for observation for other suspected diseases and conditions ruled out: Secondary | ICD-10-CM | POA: Diagnosis not present

## 2022-03-10 HISTORY — PX: LUMBAR LAMINECTOMY/DECOMPRESSION MICRODISCECTOMY: SHX5026

## 2022-03-10 HISTORY — DX: Unspecified osteoarthritis, unspecified site: M19.90

## 2022-03-10 HISTORY — DX: Depression, unspecified: F32.A

## 2022-03-10 HISTORY — DX: Headache, unspecified: R51.9

## 2022-03-10 HISTORY — DX: Attention-deficit hyperactivity disorder, unspecified type: F90.9

## 2022-03-10 HISTORY — DX: Anxiety disorder, unspecified: F41.9

## 2022-03-10 HISTORY — DX: Fatty (change of) liver, not elsewhere classified: K76.0

## 2022-03-10 HISTORY — DX: COVID-19: U07.1

## 2022-03-10 LAB — GLUCOSE, CAPILLARY
Glucose-Capillary: 158 mg/dL — ABNORMAL HIGH (ref 70–99)
Glucose-Capillary: 121 mg/dL — ABNORMAL HIGH (ref 70–99)
Glucose-Capillary: 189 mg/dL — ABNORMAL HIGH (ref 70–99)
Glucose-Capillary: 187 mg/dL — ABNORMAL HIGH (ref 70–99)
Glucose-Capillary: 286 mg/dL — ABNORMAL HIGH (ref 70–99)

## 2022-03-10 LAB — CBC
HCT: 46 % (ref 39.0–52.0)
Hemoglobin: 15.9 g/dL (ref 13.0–17.0)
MCH: 28.2 pg (ref 26.0–34.0)
MCHC: 34.6 g/dL (ref 30.0–36.0)
MCV: 81.6 fL (ref 80.0–100.0)
Platelets: 196 10*3/uL (ref 150–400)
RBC: 5.64 MIL/uL (ref 4.22–5.81)
RDW: 12.6 % (ref 11.5–15.5)
WBC: 8.4 10*3/uL (ref 4.0–10.5)
nRBC: 0 % (ref 0.0–0.2)

## 2022-03-10 LAB — COMPREHENSIVE METABOLIC PANEL
ALT: 77 U/L — ABNORMAL HIGH (ref 0–44)
AST: 63 U/L — ABNORMAL HIGH (ref 15–41)
Albumin: 4.2 g/dL (ref 3.5–5.0)
Alkaline Phosphatase: 91 U/L (ref 38–126)
Anion gap: 10 (ref 5–15)
BUN: 10 mg/dL (ref 6–20)
CO2: 23 mmol/L (ref 22–32)
Calcium: 9 mg/dL (ref 8.9–10.3)
Chloride: 100 mmol/L (ref 98–111)
Creatinine, Ser: 0.88 mg/dL (ref 0.61–1.24)
GFR, Estimated: >60 mL/min (ref >60)
Glucose, Bld: 161 mg/dL — ABNORMAL HIGH (ref 70–99)
Potassium: 3.9 mmol/L (ref 3.5–5.1)
Sodium: 133 mmol/L — ABNORMAL LOW (ref 135–145)
Total Bilirubin: 1 mg/dL (ref 0.3–1.2)
Total Protein: 6.8 g/dL (ref 6.5–8.1)

## 2022-03-10 LAB — PROTIME-INR
INR: 1.2 (ref 0.8–1.2)
Prothrombin Time: 14.6 seconds (ref 11.4–15.2)

## 2022-03-10 LAB — SURGICAL PCR SCREEN
MRSA, PCR: NEGATIVE
Staphylococcus aureus: NEGATIVE

## 2022-03-10 SURGERY — LUMBAR LAMINECTOMY/DECOMPRESSION MICRODISCECTOMY 4 LEVEL
Anesthesia: General | Site: Back

## 2022-03-10 MED ORDER — ROCURONIUM BROMIDE 10 MG/ML (PF) SYRINGE
PREFILLED_SYRINGE | INTRAVENOUS | Status: DC | PRN
  Administered 2022-03-10 (×2): 10 mg via INTRAVENOUS
  Administered 2022-03-10: 30 mg via INTRAVENOUS
  Administered 2022-03-10: 10 mg via INTRAVENOUS
  Administered 2022-03-10: 20 mg via INTRAVENOUS
  Administered 2022-03-10: 70 mg via INTRAVENOUS

## 2022-03-10 MED ORDER — ONDANSETRON HCL 4 MG PO TABS: 4.0000 mg | ORAL_TABLET | Freq: Four times a day (QID) | ORAL | Status: DC | PRN

## 2022-03-10 MED ORDER — CHLORHEXIDINE GLUCONATE CLOTH 2 % EX PADS: 6.0000 | MEDICATED_PAD | Freq: Once | CUTANEOUS | Status: DC

## 2022-03-10 MED ORDER — ACETAMINOPHEN 650 MG RE SUPP: 650.0000 mg | RECTAL | Status: DC | PRN

## 2022-03-10 MED ORDER — DEXAMETHASONE SODIUM PHOSPHATE 10 MG/ML IJ SOLN
INTRAMUSCULAR | Status: AC
  Filled 2022-03-10: qty 1

## 2022-03-10 MED ORDER — ADULT MULTIVITAMIN W/MINERALS CH
1.0000 | ORAL_TABLET | Freq: Every day | ORAL | Status: DC
  Administered 2022-03-11 – 2022-03-16 (×6): 1 via ORAL
  Filled 2022-03-10 (×6): qty 1

## 2022-03-10 MED ORDER — ONDANSETRON HCL 4 MG/2ML IJ SOLN
INTRAMUSCULAR | Status: AC
  Filled 2022-03-10: qty 2

## 2022-03-10 MED ORDER — 0.9 % SODIUM CHLORIDE (POUR BTL) OPTIME
TOPICAL | Status: DC | PRN
  Administered 2022-03-10: 1000 mL

## 2022-03-10 MED ORDER — CEFAZOLIN SODIUM-DEXTROSE 2-4 GM/100ML-% IV SOLN
2.0000 g | Freq: Three times a day (TID) | INTRAVENOUS | Status: AC
  Administered 2022-03-10 – 2022-03-11 (×2): 2 g via INTRAVENOUS
  Filled 2022-03-10 (×2): qty 100

## 2022-03-10 MED ORDER — PROPOFOL 10 MG/ML IV BOLUS
INTRAVENOUS | Status: AC
  Filled 2022-03-10: qty 20

## 2022-03-10 MED ORDER — THROMBIN 5000 UNITS EX SOLR: OROMUCOSAL | Status: DC | PRN

## 2022-03-10 MED ORDER — ALUM & MAG HYDROXIDE-SIMETH 200-200-20 MG/5ML PO SUSP: 30.0000 mL | Freq: Four times a day (QID) | ORAL | Status: DC | PRN

## 2022-03-10 MED ORDER — ACETAMINOPHEN 500 MG PO TABS
1000.0000 mg | ORAL_TABLET | Freq: Four times a day (QID) | ORAL | Status: AC
  Administered 2022-03-10 – 2022-03-11 (×4): 1000 mg via ORAL
  Filled 2022-03-10 (×4): qty 2

## 2022-03-10 MED ORDER — INSULIN ASPART 100 UNIT/ML IJ SOLN
0.0000 [IU] | INTRAMUSCULAR | Status: AC | PRN
  Administered 2022-03-10 (×2): 2 [IU] via SUBCUTANEOUS
  Filled 2022-03-10: qty 1

## 2022-03-10 MED ORDER — ADHERUS DURAL SEALANT
PACK | TOPICAL | Status: DC | PRN
  Administered 2022-03-10: 1

## 2022-03-10 MED ORDER — DEXMEDETOMIDINE HCL IN NACL 80 MCG/20ML IV SOLN
INTRAVENOUS | Status: AC
  Filled 2022-03-10: qty 20

## 2022-03-10 MED ORDER — POTASSIUM CHLORIDE IN NACL 20-0.9 MEQ/L-% IV SOLN: INTRAVENOUS | Status: DC

## 2022-03-10 MED ORDER — METFORMIN HCL 500 MG PO TABS
500.0000 mg | ORAL_TABLET | Freq: Two times a day (BID) | ORAL | Status: DC
  Administered 2022-03-11 – 2022-03-16 (×11): 500 mg via ORAL
  Filled 2022-03-10 (×11): qty 1

## 2022-03-10 MED ORDER — METHOCARBAMOL 1000 MG/10ML IJ SOLN: 500.0000 mg | Freq: Four times a day (QID) | INTRAVENOUS | Status: DC | PRN

## 2022-03-10 MED ORDER — OXYCODONE HCL 5 MG PO TABS
10.0000 mg | ORAL_TABLET | ORAL | Status: DC | PRN
  Administered 2022-03-10 – 2022-03-12 (×6): 10 mg via ORAL
  Filled 2022-03-10 (×7): qty 2

## 2022-03-10 MED ORDER — LIDOCAINE 2% (20 MG/ML) 5 ML SYRINGE
INTRAMUSCULAR | Status: AC
  Filled 2022-03-10: qty 5

## 2022-03-10 MED ORDER — MIDAZOLAM HCL 2 MG/2ML IJ SOLN
INTRAMUSCULAR | Status: AC
  Filled 2022-03-10: qty 2

## 2022-03-10 MED ORDER — ORAL CARE MOUTH RINSE: 15.0000 mL | Freq: Once | OROMUCOSAL | Status: AC

## 2022-03-10 MED ORDER — CEFAZOLIN SODIUM-DEXTROSE 2-4 GM/100ML-% IV SOLN
INTRAVENOUS | Status: AC
  Filled 2022-03-10: qty 100

## 2022-03-10 MED ORDER — CELECOXIB 200 MG PO CAPS
200.0000 mg | ORAL_CAPSULE | Freq: Two times a day (BID) | ORAL | Status: DC
  Administered 2022-03-10 – 2022-03-16 (×12): 200 mg via ORAL
  Filled 2022-03-10 (×12): qty 1

## 2022-03-10 MED ORDER — FENTANYL CITRATE (PF) 100 MCG/2ML IJ SOLN
INTRAMUSCULAR | Status: AC
  Filled 2022-03-10: qty 2

## 2022-03-10 MED ORDER — SUGAMMADEX SODIUM 500 MG/5ML IV SOLN
INTRAVENOUS | Status: AC
  Filled 2022-03-10: qty 5

## 2022-03-10 MED ORDER — FENTANYL CITRATE (PF) 250 MCG/5ML IJ SOLN
INTRAMUSCULAR | Status: AC
  Filled 2022-03-10: qty 5

## 2022-03-10 MED ORDER — CEFAZOLIN IN SODIUM CHLORIDE 3-0.9 GM/100ML-% IV SOLN
3.0000 g | INTRAVENOUS | Status: AC
  Administered 2022-03-10: 2 g via INTRAVENOUS
  Filled 2022-03-10: qty 100

## 2022-03-10 MED ORDER — PHENYLEPHRINE 80 MCG/ML (10ML) SYRINGE FOR IV PUSH (FOR BLOOD PRESSURE SUPPORT)
PREFILLED_SYRINGE | INTRAVENOUS | Status: DC | PRN
  Administered 2022-03-10 (×2): 80 ug via INTRAVENOUS
  Administered 2022-03-10: 40 ug via INTRAVENOUS
  Administered 2022-03-10: 80 ug via INTRAVENOUS
  Administered 2022-03-10: 40 ug via INTRAVENOUS

## 2022-03-10 MED ORDER — DEXAMETHASONE 4 MG PO TABS
4.0000 mg | ORAL_TABLET | Freq: Four times a day (QID) | ORAL | Status: DC
  Filled 2022-03-10 (×3): qty 1

## 2022-03-10 MED ORDER — PREGABALIN 75 MG PO CAPS
75.0000 mg | ORAL_CAPSULE | Freq: Every day | ORAL | Status: DC
  Administered 2022-03-11 – 2022-03-13 (×3): 75 mg via ORAL
  Filled 2022-03-10 (×3): qty 1

## 2022-03-10 MED ORDER — SODIUM CHLORIDE 0.9% FLUSH: 3.0000 mL | INTRAVENOUS | Status: DC | PRN

## 2022-03-10 MED ORDER — METHOCARBAMOL 500 MG PO TABS
500.0000 mg | ORAL_TABLET | Freq: Four times a day (QID) | ORAL | Status: DC | PRN
  Administered 2022-03-11 (×2): 500 mg via ORAL
  Filled 2022-03-10 (×3): qty 1

## 2022-03-10 MED ORDER — FENTANYL CITRATE (PF) 100 MCG/2ML IJ SOLN
25.0000 ug | INTRAMUSCULAR | Status: DC | PRN
  Administered 2022-03-10 (×2): 25 ug via INTRAVENOUS
  Administered 2022-03-10: 50 ug via INTRAVENOUS

## 2022-03-10 MED ORDER — THROMBIN 5000 UNITS EX SOLR
CUTANEOUS | Status: AC
  Filled 2022-03-10: qty 5000

## 2022-03-10 MED ORDER — HEMOSTATIC AGENTS (NO CHARGE) OPTIME
TOPICAL | Status: DC | PRN
  Administered 2022-03-10: 1

## 2022-03-10 MED ORDER — KETAMINE HCL 10 MG/ML IJ SOLN
INTRAMUSCULAR | Status: DC | PRN
  Administered 2022-03-10: 25 mg via INTRAVENOUS
  Administered 2022-03-10: 10 mg via INTRAVENOUS

## 2022-03-10 MED ORDER — DEXMEDETOMIDINE HCL IN NACL 80 MCG/20ML IV SOLN
INTRAVENOUS | Status: DC | PRN
  Administered 2022-03-10: 8 ug via BUCCAL

## 2022-03-10 MED ORDER — CHLORHEXIDINE GLUCONATE 0.12 % MT SOLN
15.0000 mL | Freq: Once | OROMUCOSAL | Status: AC
  Administered 2022-03-10: 15 mL via OROMUCOSAL
  Filled 2022-03-10: qty 15

## 2022-03-10 MED ORDER — PHENOL 1.4 % MT LIQD: 1.0000 | OROMUCOSAL | Status: DC | PRN

## 2022-03-10 MED ORDER — MENTHOL 3 MG MT LOZG
1.0000 | LOZENGE | OROMUCOSAL | Status: DC | PRN
  Filled 2022-03-10: qty 9

## 2022-03-10 MED ORDER — DEXAMETHASONE SODIUM PHOSPHATE 10 MG/ML IJ SOLN
INTRAMUSCULAR | Status: DC | PRN
  Administered 2022-03-10: 5 mg via INTRAVENOUS

## 2022-03-10 MED ORDER — LISINOPRIL 5 MG PO TABS
2.5000 mg | ORAL_TABLET | Freq: Every day | ORAL | Status: DC
  Administered 2022-03-11 – 2022-03-14 (×4): 2.5 mg via ORAL
  Filled 2022-03-10 (×6): qty 1

## 2022-03-10 MED ORDER — ALBUMIN HUMAN 5 % IV SOLN: INTRAVENOUS | Status: DC | PRN

## 2022-03-10 MED ORDER — ONDANSETRON HCL 4 MG/2ML IJ SOLN: 4.0000 mg | Freq: Four times a day (QID) | INTRAMUSCULAR | Status: DC | PRN

## 2022-03-10 MED ORDER — ACETAMINOPHEN 500 MG PO TABS
1000.0000 mg | ORAL_TABLET | ORAL | Status: AC
  Administered 2022-03-10: 1000 mg via ORAL
  Filled 2022-03-10: qty 2

## 2022-03-10 MED ORDER — MIDAZOLAM HCL 2 MG/2ML IJ SOLN
INTRAMUSCULAR | Status: DC | PRN
  Administered 2022-03-10: 2 mg via INTRAVENOUS

## 2022-03-10 MED ORDER — ROCURONIUM BROMIDE 10 MG/ML (PF) SYRINGE
PREFILLED_SYRINGE | INTRAVENOUS | Status: AC
  Filled 2022-03-10: qty 10

## 2022-03-10 MED ORDER — BUPIVACAINE HCL (PF) 0.25 % IJ SOLN
INTRAMUSCULAR | Status: AC
  Filled 2022-03-10: qty 30

## 2022-03-10 MED ORDER — FENTANYL CITRATE (PF) 250 MCG/5ML IJ SOLN
INTRAMUSCULAR | Status: DC | PRN
  Administered 2022-03-10 (×3): 50 ug via INTRAVENOUS
  Administered 2022-03-10: 100 ug via INTRAVENOUS

## 2022-03-10 MED ORDER — SENNA 8.6 MG PO TABS
1.0000 | ORAL_TABLET | Freq: Two times a day (BID) | ORAL | Status: DC
  Administered 2022-03-13 – 2022-03-15 (×5): 8.6 mg via ORAL
  Filled 2022-03-10 (×9): qty 1

## 2022-03-10 MED ORDER — MORPHINE SULFATE (PF) 2 MG/ML IV SOLN
2.0000 mg | INTRAVENOUS | Status: DC | PRN
  Administered 2022-03-11 (×2): 2 mg via INTRAVENOUS
  Filled 2022-03-10 (×2): qty 1

## 2022-03-10 MED ORDER — OXYCODONE HCL 5 MG/5ML PO SOLN: 5.0000 mg | Freq: Once | ORAL | Status: DC | PRN

## 2022-03-10 MED ORDER — ALBUTEROL SULFATE HFA 108 (90 BASE) MCG/ACT IN AERS: 2.0000 | INHALATION_SPRAY | RESPIRATORY_TRACT | Status: DC | PRN

## 2022-03-10 MED ORDER — PHENYLEPHRINE HCL-NACL 20-0.9 MG/250ML-% IV SOLN
INTRAVENOUS | Status: DC | PRN
  Administered 2022-03-10: 25 ug/min via INTRAVENOUS

## 2022-03-10 MED ORDER — ONDANSETRON HCL 4 MG/2ML IJ SOLN
INTRAMUSCULAR | Status: DC | PRN
  Administered 2022-03-10: 4 mg via INTRAVENOUS

## 2022-03-10 MED ORDER — LIDOCAINE 2% (20 MG/ML) 5 ML SYRINGE
INTRAMUSCULAR | Status: DC | PRN
  Administered 2022-03-10: 80 mg via INTRAVENOUS

## 2022-03-10 MED ORDER — DEXAMETHASONE SODIUM PHOSPHATE 10 MG/ML IJ SOLN
4.0000 mg | Freq: Four times a day (QID) | INTRAMUSCULAR | Status: DC
  Administered 2022-03-10 – 2022-03-11 (×2): 4 mg via INTRAVENOUS
  Filled 2022-03-10 (×2): qty 1

## 2022-03-10 MED ORDER — PHENYLEPHRINE 80 MCG/ML (10ML) SYRINGE FOR IV PUSH (FOR BLOOD PRESSURE SUPPORT)
PREFILLED_SYRINGE | INTRAVENOUS | Status: AC
  Filled 2022-03-10: qty 10

## 2022-03-10 MED ORDER — ACETAMINOPHEN 325 MG PO TABS
650.0000 mg | ORAL_TABLET | ORAL | Status: DC | PRN
  Administered 2022-03-12: 650 mg via ORAL
  Filled 2022-03-10: qty 2

## 2022-03-10 MED ORDER — SODIUM CHLORIDE 0.9% FLUSH
3.0000 mL | Freq: Two times a day (BID) | INTRAVENOUS | Status: DC
  Administered 2022-03-10 – 2022-03-16 (×12): 3 mL via INTRAVENOUS

## 2022-03-10 MED ORDER — INSULIN ASPART 100 UNIT/ML IJ SOLN
0.0000 [IU] | Freq: Three times a day (TID) | INTRAMUSCULAR | Status: DC
  Administered 2022-03-11: 11 [IU] via SUBCUTANEOUS
  Administered 2022-03-11: 8 [IU] via SUBCUTANEOUS
  Administered 2022-03-11: 11 [IU] via SUBCUTANEOUS
  Administered 2022-03-12: 5 [IU] via SUBCUTANEOUS
  Administered 2022-03-12: 8 [IU] via SUBCUTANEOUS
  Administered 2022-03-12 – 2022-03-13 (×2): 5 [IU] via SUBCUTANEOUS
  Administered 2022-03-13: 8 [IU] via SUBCUTANEOUS
  Administered 2022-03-13 – 2022-03-14 (×2): 3 [IU] via SUBCUTANEOUS
  Administered 2022-03-14: 2 [IU] via SUBCUTANEOUS
  Administered 2022-03-15: 3 [IU] via SUBCUTANEOUS
  Administered 2022-03-15: 2 [IU] via SUBCUTANEOUS
  Administered 2022-03-16 (×2): 3 [IU] via SUBCUTANEOUS

## 2022-03-10 MED ORDER — KETAMINE HCL 50 MG/5ML IJ SOSY
PREFILLED_SYRINGE | INTRAMUSCULAR | Status: AC
  Filled 2022-03-10: qty 5

## 2022-03-10 MED ORDER — PROPOFOL 10 MG/ML IV BOLUS
INTRAVENOUS | Status: DC | PRN
  Administered 2022-03-10: 200 mg via INTRAVENOUS

## 2022-03-10 MED ORDER — SODIUM CHLORIDE 0.9 % IV SOLN: 250.0000 mL | INTRAVENOUS | Status: DC

## 2022-03-10 MED ORDER — LACTATED RINGERS IV SOLN: INTRAVENOUS | Status: DC

## 2022-03-10 MED ORDER — GABAPENTIN 300 MG PO CAPS
300.0000 mg | ORAL_CAPSULE | ORAL | Status: DC
  Filled 2022-03-10: qty 1

## 2022-03-10 MED ORDER — BUPIVACAINE HCL (PF) 0.25 % IJ SOLN
INTRAMUSCULAR | Status: DC | PRN
  Administered 2022-03-10: 10 mL

## 2022-03-10 MED ORDER — OXYCODONE HCL 5 MG PO TABS: 5.0000 mg | ORAL_TABLET | Freq: Once | ORAL | Status: DC | PRN

## 2022-03-10 SURGICAL SUPPLY — 45 items
BAG COUNTER SPONGE SURGICOUNT (BAG) ×1 IMPLANT
BAND RUBBER #18 3X1/16 STRL (MISCELLANEOUS) ×2 IMPLANT
BENZOIN TINCTURE PRP APPL 2/3 (GAUZE/BANDAGES/DRESSINGS) ×1 IMPLANT
BUR CARBIDE MATCH 3.0 (BURR) ×1 IMPLANT
BUR ROUND FLUTED 5 RND (BURR) IMPLANT
CANISTER SUCT 3000ML PPV (MISCELLANEOUS) ×1 IMPLANT
DERMABOND ADVANCED .7 DNX12 (GAUZE/BANDAGES/DRESSINGS) IMPLANT
DRAPE LAPAROTOMY 100X72X124 (DRAPES) ×1 IMPLANT
DRAPE MICROSCOPE SLANT 54X150 (MISCELLANEOUS) ×1 IMPLANT
DRAPE SURG 17X23 STRL (DRAPES) ×1 IMPLANT
DRSG OPSITE POSTOP 4X8 (GAUZE/BANDAGES/DRESSINGS) IMPLANT
DURAPREP 26ML APPLICATOR (WOUND CARE) ×1 IMPLANT
ELECT REM PT RETURN 9FT ADLT (ELECTROSURGICAL) ×1
ELECTRODE REM PT RTRN 9FT ADLT (ELECTROSURGICAL) ×1 IMPLANT
GAUZE 4X4 16PLY ~~LOC~~+RFID DBL (SPONGE) IMPLANT
GLOVE BIO SURGEON STRL SZ7 (GLOVE) IMPLANT
GLOVE BIO SURGEON STRL SZ8 (GLOVE) ×1 IMPLANT
GLOVE BIOGEL PI IND STRL 7.0 (GLOVE) IMPLANT
GOWN STRL REUS W/ TWL LRG LVL3 (GOWN DISPOSABLE) IMPLANT
GOWN STRL REUS W/ TWL XL LVL3 (GOWN DISPOSABLE) ×1 IMPLANT
GOWN STRL REUS W/TWL 2XL LVL3 (GOWN DISPOSABLE) IMPLANT
GOWN STRL REUS W/TWL LRG LVL3 (GOWN DISPOSABLE)
GOWN STRL REUS W/TWL XL LVL3 (GOWN DISPOSABLE) ×1
GRAFT DURAGEN MATRIX 3WX3L (Graft) ×1 IMPLANT
GRAFT DURAGEN MATRIX 3X3 SNGL (Graft) IMPLANT
HEMOSTAT POWDER KIT SURGIFOAM (HEMOSTASIS) ×1 IMPLANT
KIT BASIN OR (CUSTOM PROCEDURE TRAY) ×1 IMPLANT
KIT TURNOVER KIT B (KITS) ×1 IMPLANT
NDL HYPO 25X1 1.5 SAFETY (NEEDLE) ×1 IMPLANT
NDL SPNL 20GX3.5 QUINCKE YW (NEEDLE) IMPLANT
NEEDLE HYPO 25X1 1.5 SAFETY (NEEDLE) ×1 IMPLANT
NEEDLE SPNL 20GX3.5 QUINCKE YW (NEEDLE) IMPLANT
NS IRRIG 1000ML POUR BTL (IV SOLUTION) ×1 IMPLANT
PACK LAMINECTOMY NEURO (CUSTOM PROCEDURE TRAY) ×1 IMPLANT
PAD ARMBOARD 7.5X6 YLW CONV (MISCELLANEOUS) ×3 IMPLANT
SEALANT ADHERUS EXTEND TIP (MISCELLANEOUS) IMPLANT
STRIP CLOSURE SKIN 1/2X4 (GAUZE/BANDAGES/DRESSINGS) ×1 IMPLANT
SUT PROLENE 6 0 BV (SUTURE) IMPLANT
SUT VIC AB 0 CT1 18XCR BRD8 (SUTURE) ×1 IMPLANT
SUT VIC AB 0 CT1 8-18 (SUTURE) ×2
SUT VIC AB 2-0 CP2 18 (SUTURE) ×1 IMPLANT
SUT VIC AB 3-0 SH 8-18 (SUTURE) ×1 IMPLANT
TOWEL GREEN STERILE (TOWEL DISPOSABLE) ×1 IMPLANT
TOWEL GREEN STERILE FF (TOWEL DISPOSABLE) ×1 IMPLANT
WATER STERILE IRR 1000ML POUR (IV SOLUTION) ×1 IMPLANT

## 2022-03-10 NOTE — Anesthesia Procedure Notes (Addendum)
Procedure Name: Intubation Date/Time: 03/10/2022 1:44 PM  Performed by: Ester Rink, CRNAPre-anesthesia Checklist: Patient identified, Emergency Drugs available, Suction available and Patient being monitored Patient Re-evaluated:Patient Re-evaluated prior to induction Oxygen Delivery Method: Circle system utilized Preoxygenation: Pre-oxygenation with 100% oxygen Induction Type: IV induction Ventilation: Mask ventilation without difficulty Laryngoscope Size: Miller and 2 Grade View: Grade II Tube type: Oral Tube size: 7.5 mm Number of attempts: 1 Airway Equipment and Method: Stylet and Oral airway Placement Confirmation: ETT inserted through vocal cords under direct vision, positive ETCO2 and breath sounds checked- equal and bilateral Secured at: 23 cm Tube secured with: Tape Dental Injury: Teeth and Oropharynx as per pre-operative assessment

## 2022-03-10 NOTE — Transfer of Care (Signed)
Immediate Anesthesia Transfer of Care Note  Patient: Kenneth Mcbride  Procedure(s) Performed: Laminectomy and Foraminotomy - Thoracic Seven - Thoracic Twelve (Back)  Patient Location: PACU  Anesthesia Type:General  Level of Consciousness: awake  Airway & Oxygen Therapy: Patient Spontanous Breathing and Patient connected to face mask oxygen  Post-op Assessment: Report given to RN and Post -op Vital signs reviewed and stable  Post vital signs: Reviewed and stable  Last Vitals:  Vitals Value Taken Time  BP 122/76 03/10/22 1800  Temp 36.7 C 03/10/22 1800  Pulse 96 03/10/22 1810  Resp 17 03/10/22 1807  SpO2 97 % 03/10/22 1810  Vitals shown include unvalidated device data.  Last Pain:  Vitals:   03/10/22 1134  TempSrc:   PainSc: 0-No pain         Complications: There were no known notable events for this encounter.

## 2022-03-10 NOTE — Anesthesia Preprocedure Evaluation (Addendum)
Anesthesia Evaluation  Patient identified by MRN, date of birth, ID band Patient awake    Reviewed: Allergy & Precautions, NPO status , [name] Chart, lab work & pertinent test results  Airway Mallampati: II  TM Distance: >3 FB Neck ROM: Full    Dental no notable dental hx.    Pulmonary    Pulmonary exam normal        Cardiovascular hypertension, Pt. on medications  Rhythm:Regular Rate:Normal     Neuro/Psych  Headaches  Anxiety Depression       GI/Hepatic negative GI ROS, Neg liver ROS,,,  Endo/Other  diabetes, Type 2, Oral Hypoglycemic Agents, Insulin Dependent    Renal/GU negative Renal ROS  negative genitourinary   Musculoskeletal  (+) Arthritis , Osteoarthritis,    Abdominal Normal abdominal exam  (+)   Peds  (+) ADHD Hematology negative hematology ROS (+)   Anesthesia Other Findings   Reproductive/Obstetrics                             Anesthesia Physical Anesthesia Plan  ASA: 3  Anesthesia Plan: General   Post-op Pain Management: Tylenol PO (pre-op)* and Ketamine IV*   Induction: Intravenous  PONV Risk Score and Plan: 2 and Ondansetron, Dexamethasone, Midazolam and Treatment may vary due to age or medical condition  Airway Management Planned: Mask and Oral ETT  Additional Equipment: None  Intra-op Plan:   Post-operative Plan: Extubation in OR  Informed Consent: I have reviewed the patients History and Physical, chart, labs and discussed the procedure including the risks, benefits and alternatives for the proposed anesthesia with the patient or authorized representative who has indicated his/her understanding and acceptance.     Dental advisory given  Plan Discussed with: CRNA  Anesthesia Plan Comments: (Lab Results      Component                Value               Date                      WBC                      8.4                 03/10/2022                 HGB                      15.9                03/10/2022                HCT                      46.0                03/10/2022                MCV                      81.6                03/10/2022                PLT  196                 03/10/2022             Lab Results      Component                Value               Date                      NA                       133 (L)             03/10/2022                K                        3.9                 03/10/2022                CO2                      23                  03/10/2022                GLUCOSE                  161 (H)             03/10/2022                BUN                      10                  03/10/2022                CREATININE               0.88                03/10/2022                CALCIUM                  9.0                 03/10/2022                EGFR                     118                 02/07/2022                GFRNONAA                 >60                 03/10/2022           )       Anesthesia Quick Evaluation

## 2022-03-10 NOTE — H&P (Signed)
Subjective: Patient is a 37 y.o. 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 Onset of symptoms was several weeks ago, rapidly worsening since that time.  The pain is rated moderate, and is located at the across the lower back and radiates to legs. The pain is described as aching and occurs all day. The symptoms have been progressive.  Was found to be spastic in his gait with hyperreflexia in the lower extremities in the office.  He had rapid progression of his myelopathy.  Symptoms are exacerbated by coughing. MRI or CT showed severe spinal stenosis from T7 down to the top of T12 with calcified ligament, with critical stenosis at T9-10 and T10-11 with almost a 2 mm spinal canal.  Past Medical History:  Diagnosis Date   ADHD (attention deficit hyperactivity disorder)    ADHD   Anxiety    Arthritis    COVID    October 2021, February 2022 - 1st one was severe per pt. and 2nd one was mild   Depression    Diabetes mellitus without complication (HCC)    Family history of adverse reaction to anesthesia    father and grand father both heart stopped during surgery   Fatty liver    Headache    Hypertension    No pertinent past medical history    Spinal stenosis     Past Surgical History:  Procedure Laterality Date   LUMBAR LAMINECTOMY/DECOMPRESSION MICRODISCECTOMY N/A 08/27/2019   Procedure: LUMBAR 2-LUMBAR 5 DECOMPRESSION;  Surgeon: Estill Bamberg, MD;  Location: [facility] OR;  Service: Orthopedics;  Laterality: N/A;    Prior to Admission medications   Medication Sig Start Date End Date Taking? Authorizing Provider  albuterol (VENTOLIN HFA) 108 (90 Base) MCG/ACT inhaler Inhale 2 puffs into the lungs every 6 (six) hours as needed. 12/17/20  Yes Breeback, Jade L, PA-C  fluticasone (FLONASE) 50 MCG/ACT nasal spray Place 2 sprays into both nostrils daily. Patient taking differently: Place 2 sprays into both nostrils daily as  needed for allergies. 06/22/21  Yes Breeback, Jade L, PA-C  lisinopril (ZESTRIL) 2.5 MG tablet Take 1 tablet (2.5 mg total) by mouth daily. 02/07/22  Yes Breeback, Jade L, PA-C  metFORMIN (GLUCOPHAGE) 500 MG tablet Take 1 tablet (500 mg total) by mouth 2 (two) times daily with a meal. 02/07/22  Yes Breeback, Jade L, PA-C  Multiple Vitamins-Minerals (MULTIVITAMIN WITH MINERALS) tablet Take 1 tablet by mouth daily.   Yes [provider]  naproxen sodium (ALEVE) 220 MG tablet Take 220 mg by mouth daily.   Yes [provider]  pregabalin (LYRICA) 75 MG capsule Take 75 mg by mouth daily.   Yes [provider]  rosuvastatin (CRESTOR) 10 MG tablet Take 1 tablet (10 mg total) by mouth daily. 02/07/22  Yes Breeback, Jade L, PA-C  tirzepatide (MOUNJARO) 7.5 MG/0.5ML Pen Inject 7.5 mg into the skin once a week. 03/10/22  Yes Breeback, Jade L, PA-C  AMBULATORY NON FORMULARY MEDICATION Glucometer lancets, strips to test blood sugar once a day for T2DM. 10/13/19   Breeback, Jade L, PA-C  SUMAtriptan (IMITREX) 50 MG tablet Take 1 tab (50 mg) by mouth for migraine headache. May repeat dose one time if headache has not subsided in 2 hours. Do not take more than 2 tabs (100 mg) in a 24 hour period. 11/02/20   Jomarie Longs, PA-C   Allergies  Allergen Reactions   Buspar [Buspirone]     Made  anxiety worse.     Social History   Tobacco Use   Smoking status: Never   Smokeless tobacco: Never  Substance Use Topics   Alcohol use: Yes    Comment: ocassionally    Family History  Problem Relation Age of Onset   Diabetes Father    Heart disease Father    Diabetes Paternal Grandfather    COPD Paternal Grandfather      Review of Systems  Positive ROS: Negative  All other systems have been reviewed and were otherwise negative with the exception of those mentioned in the HPI and as above.  Objective: Vital signs in last 24 hours: Temp:  [98.6 F (37 C)] 98.6 F (37 C) (01/12  1125) Pulse Rate:  [89] 89 (01/12 1125) Resp:  [18] 18 (01/12 1125) BP: (138)/(94) 138/94 (01/12 1125) SpO2:  [91 %] 91 % (01/12 1125) Weight:  [127.9 kg] 127.9 kg (01/12 1125)  General Appearance: Alert, cooperative, no distress, appears stated age Head: Normocephalic, without obvious abnormality, atraumatic Eyes: PERRL, conjunctiva/corneas clear, EOM's intact    Neck: Supple, symmetrical, trachea midline Back: Symmetric, no curvature, ROM normal, no CVA tenderness Lungs:  respirations unlabored Heart: Regular rate and rhythm Abdomen: Soft, non-tender Extremities: Extremities normal, atraumatic, no cyanosis or edema Pulses: 2+ and symmetric all extremities Skin: Skin color, texture, turgor normal, no rashes or lesions  NEUROLOGIC:   Mental status: Alert and oriented x4,  no aphasia, good attention span, fund of knowledge, and memory Motor Exam -spastic lower extremities with weakness in the proximal lower extremities at 4- out of 5 Sensory Exam - grossly normal Reflexes: 3+ Coordination - grossly normal Gait -spastic Balance -not tested Cranial Nerves: I: smell Not tested  II: visual acuity  OS: nl    OD: nl  II: visual fields Full to confrontation  II: pupils Equal, round, reactive to light  III,VII: ptosis None  III,IV,VI: extraocular muscles  Full ROM  V: mastication Normal  V: facial light touch sensation  Normal  V,VII: corneal reflex  Present  VII: facial muscle function - upper  Normal  VII: facial muscle function - lower Normal  VIII: hearing Not tested  IX: soft palate elevation  Normal  IX,X: gag reflex Present  XI: trapezius strength  5/5  XI: sternocleidomastoid strength 5/5  XI: neck flexion strength  5/5  XII: tongue strength  Normal    Data Review Lab Results  Component Value Date   WBC 8.4 03/10/2022   HGB 15.9 03/10/2022   HCT 46.0 03/10/2022   MCV 81.6 03/10/2022   PLT 196 03/10/2022   Lab Results  Component Value Date   NA 133 (L)  03/10/2022   K 3.9 03/10/2022   CL 100 03/10/2022   CO2 23 03/10/2022   BUN 10 03/10/2022   CREATININE 0.88 03/10/2022   GLUCOSE 161 (H) 03/10/2022   Lab Results  Component Value Date   INR 1.2 03/10/2022    Assessment/Plan:  Estimated body mass index is 39.33 kg/m as calculated from the following:   Height as of this encounter: 5\' 11"  (1.803 m).   Weight as of this encounter: 127.9 kg. Patient admitted for thoracic laminectomy and medial facetectomy T7-T12 for thoracic myelopathy with rapid progression of lower extremity symptoms. Patient has failed a reasonable attempt at conservative therapy.  The risk of the surgery is extremely high with a very high risk of neurologic worsening with the decompression either with the decompression or the cord expansion involved with the decompression.  The risk of CSF leak is high.  He understands all of this, however his risk of functional paraplegia is likely 100% without decompressive surgery, especially given the rapidity of his progression.  Therefore he is willing to accept this risk and move forward with decompressive surgery in the hopes of for decompression and some improvement of his neurologic function over time, and to stop the rapid progression of the neurologic deterioration.  I explained the condition and procedure to the patient and answered any questions.  Patient wishes to proceed with procedure as planned. Understands risks/ benefits and typical outcomes of procedure.   Eustace Moore 03/10/2022 1:12 PM

## 2022-03-10 NOTE — Telephone Encounter (Signed)
Sleep study ordered per Fintan Grater.

## 2022-03-10 NOTE — Telephone Encounter (Signed)
Donella Stade, PA-C sent to Annamaria Helling, CMA Can we get patient and home sleep apnea testing kit for him to evaluate for this?       Previous Messages  Attached Notes  Progress Notes by Derl Barrow, RN at 03/09/2022  3:51 PM  Author: Derl Barrow, RN Service: -- Author Type: Registered Nurse  Filed: 03/09/2022  4:06 PM Date of Service: 03/09/2022  3:51 PM Note Type: Progress Notes  Status: Signed Editor: Derl Barrow, RN (Registered Nurse)      03/09/22 1550  OBSTRUCTIVE SLEEP APNEA  Have you ever been diagnosed with sleep apnea through a sleep study? No  Do you snore loudly (loud enough to be heard through closed doors)?  1  Do you often feel tired, fatigued, or sleepy during the daytime (such as falling asleep during driving or talking to someone)? 0  Do you have, or are you being treated for high blood pressure? 1  BMI more than 35 kg/m2? 1  Age > 50 (1-yes) 0  Neck circumference greater than:Male 16 inches or larger, Male 17inches or larger? 1  Male Gender (Yes=1) 1  Obstructive Sleep Apnea Score 5  Score 5 or greater  Results sent to PCP

## 2022-03-10 NOTE — Op Note (Signed)
03/10/2022  5:52 PM  PATIENT:  Kenneth Mcbride  37 y.o. 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 Dominica Severin cram  ANESTHESIA:   General  EBL: 600 ml  Total I/O In: 2316.7 [I.V.:2000; IV Piggyback:316.7] Out: 2000 [Urine:1400; Blood:600]  BLOOD ADMINISTERED: none  DRAINS: None  SPECIMEN:  none  Complications: Unintended durotomy at T11-12 where the calcified ligament eroded the dura and there was really no significant dura mater at this level  INDICATION FOR PROCEDURE: This patient presented with rapidly progressive gait disturbance with hyperreflexia and myelopathy. Imaging showed severe spinal stenosis T7-8 down to T11-12 with critical stenosis at the T910 and T10-11 spondylosis and calcified ligament. canal was down to about 2 mm at the 910 and T10-11. The patient tried conservative measures without relief. Pain was debilitating. Recommended decompressive thoracic laminectomy. Patient understood the risks, benefits, and alternatives and potential outcomes and wished to proceed.  PROCEDURE DETAILS: The patient was taken to the operating room and after induction of adequate generalized endotracheal anesthesia, the patient was rolled into the prone position on chest rolls and all pressure points were padded. The thoracic and region was cleaned and then prepped with DuraPrep and draped in the usual sterile fashion.  We marked our incision utilizing AP fluoroscopy from T7-T12. 10 cc of local anesthesia was injected and then a dorsal midline incision was made and carried down to the thoracic fascia. The fascia was opened and the  paraspinous musculature was taken down in a subperiosteal fashion to expose T7-T12 bilaterally. Intraoperative anoscopy confirmed my level, and then I moved the processes from T7-T11 at the top of T12.  We used a combination of the high-speed drill and the Kerrison punches to perform a laminectomy, medial facetectomy, and foraminotomy at T7-8, T8-9, T9-10, T10-11, and T11-12 bilaterally.  We drilled the lamina down to an eggshell at each level.  Fortunately at T11-12, the calcified ligament was completely stuck to the dura or had replaced the dura and once this was removed there was really no significant dural tissue overlying the cord.  Because of the calcified ligament and the critical stenosis, it was a difficult decompression and took much longer than would be typical of a typical laminectomy for typical thoracic stenosis.  We took great care to drill the bone down to an eggshell and then removed carefully with a 1 and 2 mm Kerrison punch working along the gutters and releasing the bone and then peeling it away from the surface of the dura being very careful not to cause any pressure down on the spinal cord.  When we were done the dura was fully capacious all the way across from T7-T12.  We again took great care and extra lengths to perform a gentle decompression as we possibly could given the severity of the stenosis and the compression of the cord and his rapidly progressive myelopathy.  We spent considerable time working in the gutters and releasing the calcified ligament and then peeling it away from the dura at each level.  I irrigated with saline solution. Achieved hemostasis with bipolar cautery, lined the dura with Gelfoam, lined the T11-12 space with DuraGen and Tisseel fibrin glue and then closed the fascia with 0 Vicryl. I closed  the subcutaneous tissues with 2-0 Vicryl and the subcuticular tissues with 3-0 Vicryl. The skin was then closed with a bond, benzoin and Steri-Strips. The drapes were  removed, a sterile dressing was applied.  Dr. Saintclair Halsted was likely involved in the decompression of the neural elements, the removal of the calcified ligament and the closure. the patient was awakened from general anesthesia and transferred to the recovery room in stable condition. At the end of the procedure all sponge, needle and instrument counts were correct.    PLAN OF CARE: Admit to inpatient   PATIENT DISPOSITION:  PACU - hemodynamically stable.   Delay start of Pharmacological VTE agent (>24hrs) due to surgical blood loss or risk of bleeding:  yes

## 2022-03-11 LAB — GLUCOSE, CAPILLARY
Glucose-Capillary: 323 mg/dL — ABNORMAL HIGH (ref 70–99)
Glucose-Capillary: 334 mg/dL — ABNORMAL HIGH (ref 70–99)
Glucose-Capillary: 282 mg/dL — ABNORMAL HIGH (ref 70–99)
Glucose-Capillary: 310 mg/dL — ABNORMAL HIGH (ref 70–99)

## 2022-03-11 MED ORDER — DEXAMETHASONE 2 MG PO TABS
2.0000 mg | ORAL_TABLET | Freq: Four times a day (QID) | ORAL | Status: DC
  Administered 2022-03-12 – 2022-03-13 (×6): 2 mg via ORAL
  Filled 2022-03-11 (×10): qty 1

## 2022-03-11 MED ORDER — DEXAMETHASONE SODIUM PHOSPHATE 10 MG/ML IJ SOLN
2.0000 mg | Freq: Four times a day (QID) | INTRAMUSCULAR | Status: DC
  Administered 2022-03-11 (×2): 2 mg via INTRAVENOUS
  Filled 2022-03-11 (×2): qty 1

## 2022-03-11 NOTE — Progress Notes (Signed)
Patient ID: Kenneth Mcbride, male   DOB: 02/02/1986, 37 y.o.   MRN: [id number] Overall he looks good.moving legs well in bed, stable Tingling in legs with episodes of no tingling, dressing clean and flat, no headache

## 2022-03-11 NOTE — Progress Notes (Signed)
   03/11/22 0803  Assess: MEWS Score  Temp 98.9 F (37.2 C)  BP 116/62  MAP (mmHg) 78  Pulse Rate (!) 113  Resp 19  Level of Consciousness Alert  SpO2 96 %  O2 Device Room Air  Assess: MEWS Score  MEWS Temp 0  MEWS Systolic 0  MEWS Pulse 2  MEWS RR 0  MEWS LOC 0  MEWS Score 2  MEWS Score Color Yellow  Assess: if the MEWS score is Yellow or Red  Were vital signs taken at a resting state? Yes  Focused Assessment No change from prior assessment  Does the patient meet 2 or more of the SIRS criteria? No  MEWS guidelines implemented *See Row Information* Yes  Treat  MEWS Interventions Administered prn meds/treatments  Pain Scale 0-10  Pain Score 5  Pain Type Surgical pain  Pain Location Back  Pain Orientation Mid;Lower  Pain Descriptors / Indicators Aching;Discomfort  Pain Frequency Constant  Pain Onset On-going  Patients Stated Pain Goal 2  Pain Intervention(s) Medication (See eMAR)  Multiple Pain Sites No  Take Vital Signs  Increase Vital Sign Frequency  Yellow: Q 2hr X 2 then Q 4hr X 2, if remains yellow, continue Q 4hrs  Escalate  MEWS: Escalate Yellow: discuss with charge nurse/RN and consider discussing with provider and RRT  Notify: Charge Nurse/RN  Name of Charge Nurse/RN Notified Firefighter  Date Charge Nurse/RN Notified 03/11/22  Time Charge Nurse/RN Notified 2458  Provider Notification  Provider Name/Title Dr. Ronnald Ramp  Date Provider Notified 03/11/22  Time Provider Notified [phone]  Method of Notification Call  Notification Reason Other (Comment) (yellow MEWS)  Provider response No new orders  Date of Provider Response 03/11/22  Time of Provider Response 0810  Notify: Rapid Response  Name of Rapid Response RN Notified Not applicable  Document  Patient Outcome Other (Comment) (Pt is stable, prn meds given.)  Progress note created (see row info) Yes  Assess: SIRS CRITERIA  SIRS Temperature  0  SIRS Pulse 1  SIRS Respirations  0  SIRS WBC 0  SIRS  Score Sum  1

## 2022-03-12 LAB — GLUCOSE, CAPILLARY
Glucose-Capillary: 233 mg/dL — ABNORMAL HIGH (ref 70–99)
Glucose-Capillary: 261 mg/dL — ABNORMAL HIGH (ref 70–99)
Glucose-Capillary: 207 mg/dL — ABNORMAL HIGH (ref 70–99)

## 2022-03-12 NOTE — Anesthesia Postprocedure Evaluation (Signed)
Anesthesia Post Note  Patient: Kenneth Mcbride  Procedure(s) Performed: Laminectomy and Foraminotomy - Thoracic Seven - Thoracic Twelve (Back)     Patient location during evaluation: PACU Anesthesia Type: General Level of consciousness: sedated Pain management: pain level controlled Vital Signs Assessment: post-procedure vital signs reviewed and stable Respiratory status: spontaneous breathing and respiratory function stable Cardiovascular status: stable Postop Assessment: no apparent nausea or vomiting Anesthetic complications: no   There were no known notable events for this encounter.                Monic Engelmann DANIEL

## 2022-03-12 NOTE — Progress Notes (Signed)
Subjective: The patient is alert and pleasant.  His back is appropriately sore.  He denies headaches.  He feels stronger and his numbness/tingling has resolved.  Objective: Vital signs in last 24 hours: Temp:  [98 F (36.7 C)-98.9 F (37.2 C)] 98 F (36.7 C) (01/14 0507) Pulse Rate:  [85-111] 88 (01/14 0825) Resp:  [18] 18 (01/14 0507) BP: (109-126)/(58-71) 126/71 (01/14 0825) SpO2:  [96 %-98 %] 97 % (01/14 0825) Estimated body mass index is 39.33 kg/m as calculated from the following:   Height as of this encounter: 5\' 11"  (1.803 m).   Weight as of this encounter: 127.9 kg.   Intake/Output from previous day: 01/13 0701 - 01/14 0700 In: 840 [P.O.:840] Out: 2200 [Urine:2200] Intake/Output this shift: No intake/output data recorded.  Physical exam the patient is alert and pleasant.  His strength is grossly normal as above gastrocnemius and dorsiflexors.  Lab Results: Recent Labs    03/10/22 1147  WBC 8.4  HGB 15.9  HCT 46.0  PLT 196   BMET Recent Labs    03/10/22 1147  NA 133*  K 3.9  CL 100  CO2 23  GLUCOSE 161*  BUN 10  CREATININE 0.88  CALCIUM 9.0    Studies/Results: DG THORACOLUMABAR SPINE  Result Date: 03/10/2022 CLINICAL DATA:  Surgery EXAM: THORACOLUMBAR SPINE 1V COMPARISON:  10/04/2021 FINDINGS: Single intraoperative spot image is submitted for intraoperative localization. Appears to be a surgical instrument overlying the T12 level although exact localization is difficult on the spot image. IMPRESSION: Intraoperative localization as above. Electronically Signed   By: Rolm Baptise M.D.   On: 03/10/2022 19:08   DG C-Arm 1-60 Min-No Report  Result Date: 03/10/2022 Fluoroscopy was utilized by the requesting physician.  No radiographic interpretation.   DG C-Arm 1-60 Min-No Report  Result Date: 03/10/2022 Fluoroscopy was utilized by the requesting physician.  No radiographic interpretation.   DG C-Arm 1-60 Min-No Report  Result Date:  03/10/2022 Fluoroscopy was utilized by the requesting physician.  No radiographic interpretation.    Assessment/Plan: Postop day #2: We will keep him flat and hopefully can mobilize him tomorrow.  His myelopathy is improving.  I have answered all his questions.  LOS: 2 days     [name] 03/12/2022, 9:43 AM     Patient ID: Kenneth Mcbride, male   DOB: 09-11-1985, 37 y.o.   MRN: [id number]

## 2022-03-13 LAB — GLUCOSE, CAPILLARY
Glucose-Capillary: 309 mg/dL — ABNORMAL HIGH (ref 70–99)
Glucose-Capillary: 289 mg/dL — ABNORMAL HIGH (ref 70–99)
Glucose-Capillary: 257 mg/dL — ABNORMAL HIGH (ref 70–99)
Glucose-Capillary: 179 mg/dL — ABNORMAL HIGH (ref 70–99)
Glucose-Capillary: 226 mg/dL — ABNORMAL HIGH (ref 70–99)

## 2022-03-13 MED ORDER — DOCUSATE SODIUM 100 MG PO CAPS
100.0000 mg | ORAL_CAPSULE | Freq: Two times a day (BID) | ORAL | Status: DC
  Administered 2022-03-13 – 2022-03-15 (×4): 100 mg via ORAL
  Filled 2022-03-13 (×4): qty 1

## 2022-03-13 MED ORDER — TIZANIDINE HCL 4 MG PO TABS
4.0000 mg | ORAL_TABLET | Freq: Three times a day (TID) | ORAL | Status: DC
  Administered 2022-03-13 – 2022-03-16 (×9): 4 mg via ORAL
  Filled 2022-03-13 (×9): qty 1

## 2022-03-13 MED ORDER — BISACODYL 5 MG PO TBEC: 5.0000 mg | DELAYED_RELEASE_TABLET | Freq: Every day | ORAL | Status: DC | PRN

## 2022-03-13 MED ORDER — SENNA 8.6 MG PO TABS: 1.0000 | ORAL_TABLET | Freq: Every day | ORAL | Status: DC

## 2022-03-13 MED ORDER — DEXAMETHASONE 0.5 MG PO TABS
1.0000 mg | ORAL_TABLET | Freq: Four times a day (QID) | ORAL | Status: DC
  Filled 2022-03-13: qty 2

## 2022-03-13 MED ORDER — PROPOFOL 1000 MG/100ML IV EMUL
INTRAVENOUS | Status: AC
  Filled 2022-03-13: qty 100

## 2022-03-13 MED ORDER — PREGABALIN 75 MG PO CAPS
75.0000 mg | ORAL_CAPSULE | Freq: Two times a day (BID) | ORAL | Status: DC
  Administered 2022-03-13 – 2022-03-16 (×6): 75 mg via ORAL
  Filled 2022-03-13 (×6): qty 1

## 2022-03-13 MED ORDER — DEXAMETHASONE SODIUM PHOSPHATE 10 MG/ML IJ SOLN: 1.0000 mg | Freq: Four times a day (QID) | INTRAMUSCULAR | Status: DC

## 2022-03-13 MED ORDER — NALOXEGOL OXALATE 12.5 MG PO TABS
12.5000 mg | ORAL_TABLET | Freq: Every day | ORAL | Status: DC
  Administered 2022-03-13 – 2022-03-16 (×4): 12.5 mg via ORAL
  Filled 2022-03-13 (×4): qty 1

## 2022-03-13 MED FILL — Thrombin For Soln 5000 Unit: CUTANEOUS | Qty: 5000 | Status: AC

## 2022-03-13 NOTE — Progress Notes (Signed)
Patient ID: Kenneth Mcbride, male   DOB: March 18, 1985, 37 y.o.   MRN: [id number] Subjective: Patient reports no headache, NT legs improved, good strength, pain much improved  Objective: Vital signs in last 24 hours: Temp:  [98.1 F (36.7 C)-98.9 F (37.2 C)] 98.9 F (37.2 C) (01/15 0826) Pulse Rate:  [83-92] 92 (01/15 0826) Resp:  [15-18] 16 (01/15 0826) BP: (107-142)/(51-76) 130/76 (01/15 0826) SpO2:  [97 %-98 %] 98 % (01/15 0826)  Intake/Output from previous day: 01/14 0701 - 01/15 0700 In: -  Out: 2850 [Urine:2850] Intake/Output this shift: No intake/output data recorded.  Neurologic: Grossly normal to in bed exam, dressing dry and flat  Lab Results: Lab Results  Component Value Date   WBC 8.4 03/10/2022   HGB 15.9 03/10/2022   HCT 46.0 03/10/2022   MCV 81.6 03/10/2022   PLT 196 03/10/2022   Lab Results  Component Value Date   INR 1.2 03/10/2022   BMET Lab Results  Component Value Date   NA 133 (L) 03/10/2022   K 3.9 03/10/2022   CL 100 03/10/2022   CO2 23 03/10/2022   GLUCOSE 161 (H) 03/10/2022   BUN 10 03/10/2022   CREATININE 0.88 03/10/2022   CALCIUM 9.0 03/10/2022    Studies/Results: No results found.  Assessment/Plan: Doing well, HOB up to 30 today  Estimated body mass index is 39.33 kg/m as calculated from the following:   Height as of this encounter: 5\' 11"  (1.803 m).   Weight as of this encounter: 127.9 kg.    LOS: 3 days    Eustace Moore 03/13/2022, 8:59 AM

## 2022-03-13 NOTE — Plan of Care (Signed)
  Problem: Education: Goal: Knowledge of General Education information will improve Description: Including pain rating scale, medication(s)/side effects and non-pharmacologic comfort measures Outcome: Progressing   Problem: Clinical Measurements: Goal: Ability to maintain clinical measurements within normal limits will improve Outcome: Progressing Goal: Will remain free from infection Outcome: Progressing Goal: Respiratory complications will improve Outcome: Progressing   Problem: Activity: Goal: Risk for activity intolerance will decrease Outcome: Progressing   Problem: Nutrition: Goal: Adequate nutrition will be maintained Outcome: Progressing

## 2022-03-13 NOTE — Progress Notes (Signed)
  Transition of Care [facility]) Screening Note   Patient Details  Name: Kenneth Mcbride Date of Birth: 1985/06/13   [facility]) CM/SW Contact:    Bartholomew Crews, RN Phone Number: [phone] 03/13/2022, 10:12 AM  S/p laminetomy on 1/12  Transition of Care Department [facility]) has reviewed patient and no TOC needs have been identified at this time. We will continue to monitor patient advancement through interdisciplinary progression rounds. If new patient transition needs arise, please place a TOC consult.

## 2022-03-14 ENCOUNTER — Encounter (HOSPITAL_COMMUNITY): Payer: Self-pay | Admitting: Neurological Surgery

## 2022-03-14 LAB — GLUCOSE, CAPILLARY
Glucose-Capillary: 145 mg/dL — ABNORMAL HIGH (ref 70–99)
Glucose-Capillary: 181 mg/dL — ABNORMAL HIGH (ref 70–99)
Glucose-Capillary: 118 mg/dL — ABNORMAL HIGH (ref 70–99)
Glucose-Capillary: 212 mg/dL — ABNORMAL HIGH (ref 70–99)

## 2022-03-14 NOTE — Progress Notes (Signed)
Subjective: Patient reports doing well, has been ambulating well  Objective: Vital signs in last 24 hours: Temp:  [97.5 F (36.4 C)-98.9 F (37.2 C)] 97.5 F (36.4 C) (01/15 2014) Pulse Rate:  [81-92] 81 (01/15 2014) Resp:  [16] 16 (01/15 2014) BP: (104-130)/(70-76) 104/70 (01/15 2014) SpO2:  [98 %-99 %] 99 % (01/15 2014)  Intake/Output from previous day: 01/15 0701 - 01/16 0700 In: -  Out: 1250 [Urine:1250] Intake/Output this shift: No intake/output data recorded.  Neurologic: Grossly normal  Lab Results: Lab Results  Component Value Date   WBC 8.4 03/10/2022   HGB 15.9 03/10/2022   HCT 46.0 03/10/2022   MCV 81.6 03/10/2022   PLT 196 03/10/2022   Lab Results  Component Value Date   INR 1.2 03/10/2022   BMET Lab Results  Component Value Date   NA 133 (L) 03/10/2022   K 3.9 03/10/2022   CL 100 03/10/2022   CO2 23 03/10/2022   GLUCOSE 161 (H) 03/10/2022   BUN 10 03/10/2022   CREATININE 0.88 03/10/2022   CALCIUM 9.0 03/10/2022    Studies/Results: No results found.  Assessment/Plan: Postop day 4 thoracic decompression for myelopathy. He is doing well and making good progress. Will have therapy work with him today and determine disposition. Ordered PVR to make sure he is emptying his bladder   LOS: 4 days    Kenneth Mcbride 03/14/2022, 8:07 AM

## 2022-03-14 NOTE — Inpatient Diabetes Management (Signed)
Inpatient Diabetes Program Recommendations  AACE/ADA: New Consensus Statement on Inpatient Glycemic Control (2015)  Target Ranges:  Prepandial:   less than 140 mg/dL      Peak postprandial:   less than 180 mg/dL (1-2 hours)      Critically ill patients:  140 - 180 mg/dL   Lab Results  Component Value Date   GLUCAP 181 (H) 03/14/2022   HGBA1C 10.4 (H) 02/07/2022    Review of Glycemic Control  Latest Reference Range & Units 03/13/22 11:33 03/13/22 16:05 03/13/22 22:21 03/14/22 08:12 03/14/22 11:18  Glucose-Capillary 70 - 99 mg/dL 257 (H) 179 (H) 226 (H) 145 (H) 181 (H)  (H): Data is abnormally high Diabetes history: Type 2 DM Outpatient Diabetes medications: Mounjaro 7.5 mg qwk, Metformin 500 mg BID Current orders for Inpatient glycemic control: Metformin 500 mg BID, Novolog 0-15 units TID  Inpatient Diabetes Program Recommendations:    Consider increasing Metformin to 1000 mg BID.  Add glucose meter #[id number]  Spoke with patient regarding outpatient diabetes management. Patient admits to snacking over holidays and also received several steroid injections for pain. Started Mounjaro four weeks ago.  Reviewed patient's current A1c of 10.4%. Explained what a A1c is and what it measures. Also reviewed goal A1c with patient, importance of good glucose control @ home, and blood sugar goals. Reviewed patho of DM, need for improved control, signs and symptoms of hypo vs hyper glycemia, GLPs, impact of steroids on glycemic control, role of pancreas, vascular changes and commorbidities.  Patient will need a meter and test strips at discharge #[id number]. Admits to not checking frequently. Discussed recommended frequency and when to call MD. Encouraged patient to make PCP appointment in the next few weeks. Also, provided option for CGM and reviewed benefits of wearing device. Patient not interested at this time.  Reviewed alternatives to sugary beverages and stressed the importance of being mindful of  CHO intake. Reviewed example of protein and encouraged these with snacks when needed.  Patient is motivated to make changes. No further questions at this time.   Thanks, Bronson Curb, MSN, RNC-OB Diabetes Coordinator [phone] (8a-5p)

## 2022-03-14 NOTE — Evaluation (Signed)
Physical Therapy Evaluation Patient Details Name: Kenneth Mcbride MRN: [id number] DOB: March 06, 1985 Today's Date: 03/14/2022  History of Present Illness  Pt is 37 yo male who presents on 03/10/22 for thoracic laminectomy and medial facetectomy and foraminotomies T7-12 due to progressive thoracic myelopathy with gait disturbance. PMH: ADHD, DM, HTN, fatty liver, anxiety and depression, L2-5 lumbar lam 2021  Clinical Impression  Pt admitted with above diagnosis. Pt received sitting EOB, visitor present. Pt with decreased sensation and proprioceptive deficits B feet since lumbar surgery but notably, pt experiencing warmth and some sensation in feet since surgery that was not there PTA. Decreased proprioception apparent in pt's gait pattern with foot slap and decreased neuromuscular control with foot placement. Pt doing well compensating for this with use of RW. Ambulated 50' with min A. Recommend PT see again tomorrow to advance ambulation and practice steps and expect pt will be ready for d/c home from PT standpoint.  Pt currently with functional limitations due to the deficits listed below (see PT Problem List). Pt will benefit from skilled PT to increase their independence and safety with mobility to allow discharge to the venue listed below.          Recommendations for follow up therapy are one component of a multi-disciplinary discharge planning process, led by the attending physician.  Recommendations may be updated based on patient status, additional functional criteria and insurance authorization.  Follow Up Recommendations Outpatient PT, when cleared by neurosurgeon      Assistance Recommended at Discharge Intermittent Supervision/Assistance  Patient can return home with the following  A little help with walking and/or transfers;A little help with bathing/dressing/bathroom;Assistance with cooking/housework;Assist for transportation;Help with stairs or ramp for entrance    Equipment  Recommendations None recommended by PT  Recommendations for Other Services       Functional Status Assessment Patient has had a recent decline in their functional status and demonstrates the ability to make significant improvements in function in a reasonable and predictable amount of time.     Precautions / Restrictions Precautions Precautions: Back Precaution Booklet Issued: Yes (comment) Precaution Comments: reviewed h.o that OT gave earlier in day Restrictions Weight Bearing Restrictions: No      Mobility  Bed Mobility Overal bed mobility: Needs Assistance Bed Mobility: Sit to Supine       Sit to supine: Min assist   General bed mobility comments: min A to LE's to get into bed. Pt able to position with rails once in supine    Transfers Overall transfer level: Needs assistance Equipment used: Rolling walker (2 wheels) Transfers: Sit to/from Stand Sit to Stand: Supervision           General transfer comment: vc's for hand placement, close guarding for safety    Ambulation/Gait Ambulation/Gait assistance: Min assist Gait Distance (Feet): 50 Feet Assistive device: Rolling walker (2 wheels) Gait Pattern/deviations: Step-through pattern Gait velocity: decreased Gait velocity interpretation: <1.8 ft/sec, indicate of risk for recurrent falls   General Gait Details: decreased LE control noted with stepping and evident that pt has abnormal sensation B feet. No LOB with use of RW for support. Min A for Scientist, research (medical)    Modified Rankin (Stroke Patients Only)       Balance Overall balance assessment: History of Falls, Needs assistance Sitting-balance support: No upper extremity supported, Feet supported Sitting balance-Leahy Scale: Good     Standing balance support: Bilateral upper extremity supported,  During functional activity, Reliant on assistive device for balance Standing balance-Leahy Scale: Poor Standing balance  comment: pt has had diminished balance since first surgery. Lack of sensation and neuromuscular control currently limiting balance but pt compensating well with AD                             Pertinent Vitals/Pain Pain Assessment Pain Assessment: 0-10 Pain Score: 5  Pain Location: thoracic back Pain Descriptors / Indicators: Grimacing, Guarding, Sore, Operative site guarding Pain Intervention(s): Limited activity within patient's tolerance, Monitored during session, Premedicated before session    Home Living Family/patient expects to be discharged to:: Private residence Living Arrangements: Spouse/significant other (2 foreign exchange students) Available Help at Discharge: Family;Available PRN/intermittently Type of Home: House Home Access: Stairs to enter Entrance Stairs-Rails: None Entrance Stairs-Number of Steps: 2   Home Layout: One level Home Equipment: Agricultural consultant (2 wheels);BSC/3in1;Tub bench;Cane - single point Additional Comments: pt's girlfriend is a Runner, broadcasting/film/video. There will be several hours of the day that he is alone at home. 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    Prior Function Prior Level of Function : Independent/Modified Independent             Mobility Comments: has been using cane as mobility has declined after he bent over to move a piece of furniture       Hand Dominance   Dominant Hand: Left    Extremity/Trunk Assessment   Upper Extremity Assessment Upper Extremity Assessment: Defer to OT evaluation    Lower Extremity Assessment Lower Extremity Assessment: RLE deficits/detail;LLE deficits/detail;Generalized weakness RLE Deficits / Details: grossly 3+/5 with decreased sensation/ proprioception since lumbar surgery, of note though, pt having sensation and warmth in his feet since surgery that he has not had since lumbar surgery RLE Sensation: decreased proprioception;decreased light touch RLE Coordination: decreased gross motor LLE Deficits /  Details: strength grossly the same as RLE LLE Sensation: decreased light touch;decreased proprioception LLE Coordination: decreased gross motor    Cervical / Trunk Assessment Cervical / Trunk Assessment: Back Surgery  Communication   Communication: No difficulties  Cognition Arousal/Alertness: Awake/alert Behavior During Therapy: WFL for tasks assessed/performed Overall Cognitive Status: Within Functional Limits for tasks assessed                                          General Comments General comments (skin integrity, edema, etc.): VSS.    Exercises Other Exercises Other Exercises: pelvic tilt in supine x5   Assessment/Plan    PT Assessment Patient needs continued PT services  PT Problem List Decreased strength;Decreased balance;Decreased mobility;Decreased coordination;Decreased knowledge of use of DME;Decreased knowledge of precautions;Pain;Impaired sensation       PT Treatment Interventions DME instruction;Gait training;Stair training;Functional mobility training;Therapeutic activities;Therapeutic exercise;Balance training;Patient/family education;Neuromuscular re-education    PT Goals (Current goals can be found in the Care Plan section)  Acute Rehab PT Goals Patient Stated Goal: be able to throw the softball with his exchange student PT Goal Formulation: With patient Time For Goal Achievement: 03/28/22 Potential to Achieve Goals: Good    Frequency Min 5X/week     Co-evaluation               AM-PAC PT "6 Clicks" Mobility  Outcome Measure Help needed turning from your back to your side while in a flat bed without using bedrails?: None  Help needed moving from lying on your back to sitting on the side of a flat bed without using bedrails?: None Help needed moving to and from a bed to a chair (including a wheelchair)?: A Little Help needed standing up from a chair using your arms (e.g., wheelchair or bedside chair)?: A Little Help needed to  walk in hospital room?: A Little Help needed climbing 3-5 steps with a railing? : A Lot 6 Click Score: 19    End of Session Equipment Utilized During Treatment: Gait belt Activity Tolerance: Patient tolerated treatment well Patient left: in bed;with call bell/phone within reach;with family/visitor present Nurse Communication: Mobility status PT Visit Diagnosis: Unsteadiness on feet (R26.81);History of falling (Z91.81);Difficulty in walking, not elsewhere classified (R26.2);Pain Pain - part of body:  (back)    Time: 1121-1140 PT Time Calculation (min) (ACUTE ONLY): 19 min   Charges:   PT Evaluation $PT Eval Moderate Complexity: [place] chat preferred Office [place] 03/14/2022, 1:28 PM

## 2022-03-14 NOTE — Evaluation (Signed)
Occupational Therapy Evaluation Patient Details Name: Kenneth Mcbride MRN: [id number] DOB: [birth year] Today's Date: 03/14/2022   History of Present Illness pt is a 37 y/o male S/P thoracic decompression for myelopathy. PMH includes previous laminectomy and bilateral foraminotomy with removal of large central L3-4 disc hernation.   Clinical Impression   Pt in bed upon therapy arrival with visitor present at bedside. Pt reports that prior to this admit he was utilizing cane due to numbness in bilateral legs which made him a high risk of falls. 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 Currently, pt is experiencing generalized decreased strength due to muscle imbalance and recent back surgery. He moved fairly well within his room using RW and demonstrated good carry over of back precautions. Pt reports that his numbness has improved since the surgery and he is also moving better today than he was yesterday. I do not recommend any follow up OT services at this time as I foresee him re-gaining his strength and endurance while gradually returning to completing every day tasks. Pt will have support from his girlfriend in the evenings and afternoon when she returns from work. During the day, his sister could stay with him and assist as needed. All education has been completed; will sign off.      Recommendations for follow up therapy are one component of a multi-disciplinary discharge planning process, led by the attending physician.  Recommendations may be updated based on patient status, additional functional criteria and insurance authorization.   Follow Up Recommendations  No OT follow up           Functional Status Assessment  Patient has had a recent decline in their functional status and demonstrates the ability to make significant improvements in function in a reasonable and predictable amount of time.  Equipment Recommendations  None recommended by OT (has all necessary DME)        Precautions / Restrictions Precautions Precautions: Back Precaution Booklet Issued: Yes (comment) Precaution Comments: Provided handout and provided verbal instructions reviewing back precautions Restrictions Weight Bearing Restrictions: No      Mobility Bed Mobility Overal bed mobility: Needs Assistance Bed Mobility: Rolling, Sidelying to Sit Rolling: Supervision Sidelying to sit: Min guard       General bed mobility comments: Bed rails lowered to practice home bed set-up. Pt required increased time and effort to transition from sidelying to sit due to back discomfort and weakness. VC provided for compensatory techniques when completing at home. Patient Response: Cooperative  Transfers Overall transfer level: Needs assistance Equipment used: Rolling walker (2 wheels) Transfers: Sit to/from Stand, Bed to chair/wheelchair/BSC Sit to Stand: Supervision     Step pivot transfers: Supervision            Balance Overall balance assessment: No apparent balance deficits (not formally assessed), History of Falls       ADL either performed or assessed with clinical judgement   ADL Overall ADL's : Needs assistance/impaired         Upper Body Bathing: Set up;Sitting   Lower Body Bathing: Min guard;Sit to/from stand;Adhering to back precautions   Upper Body Dressing : Set up;Sitting   Lower Body Dressing: Min guard;Sit to/from stand;Adhering to back precautions   Toilet Transfer: Min guard;Regular Toilet;Rolling walker (2 wheels);Grab bars   Toileting- Clothing Manipulation and Hygiene: Min guard;Sit to/from stand               Vision Baseline Vision/History: 1 Wears glasses (or  contacts) Patient Visual Report: No change from baseline Vision Assessment?: No apparent visual deficits            Pertinent Vitals/Pain Pain Assessment Pain Assessment: Faces Faces Pain Scale: Hurts even more Pain Location: thoracic back during movement/mobility and  assessing LB dressing Pain Descriptors / Indicators: Grimacing, Guarding, Sore, Operative site guarding Pain Intervention(s): Limited activity within patient's tolerance, Monitored during session, Repositioned     Hand Dominance Left   Extremity/Trunk Assessment Upper Extremity Assessment Upper Extremity Assessment: Overall WFL for tasks assessed (Back pain impeding ability to demonstrate full strength in BUE)   Lower Extremity Assessment Lower Extremity Assessment: Defer to PT evaluation   Cervical / Trunk Assessment Cervical / Trunk Assessment: Back Surgery   Communication Communication Communication: No difficulties   Cognition Arousal/Alertness: Awake/alert Behavior During Therapy: WFL for tasks assessed/performed Overall Cognitive Status: Within Functional Limits for tasks assessed     General Comments  VSS            Home Living Family/patient expects to be discharged to:: Private residence Living Arrangements: Spouse/significant other (Control and instrumentation engineer)   Type of Home: House Home Access: Stairs to enter Technical brewer of Steps: 2 Entrance Stairs-Rails: None Home Layout: One level     Bathroom Shower/Tub: Corporate investment banker: Standard Bathroom Accessibility: Yes How Accessible: Accessible via walker Home Equipment: Rolling Walker (2 wheels);BSC/3in1;Tub bench;Cane - single point          Prior Functioning/Environment Prior Level of Function : Independent/Modified Independent            OT Problem List: Pain;Decreased activity tolerance         OT Goals(Current goals can be found in the care plan section) Acute Rehab OT Goals Patient Stated Goal: to get stronger  OT Frequency:  1 time visit       AM-PAC OT "6 Clicks" Daily Activity     Outcome Measure Help from another person eating meals?: None Help from another person taking care of personal grooming?: None Help from another person toileting, which  includes using toliet, bedpan, or urinal?: None Help from another person bathing (including washing, rinsing, drying)?: A Little Help from another person to put on and taking off regular upper body clothing?: None Help from another person to put on and taking off regular lower body clothing?: A Little 6 Click Score: 22   End of Session Equipment Utilized During Treatment: Rolling walker (2 wheels)  Activity Tolerance: Patient tolerated treatment well Patient left: in chair;with call bell/phone within reach;with family/visitor present  OT Visit Diagnosis: Muscle weakness (generalized) (M62.81)                Time: 8676-1950 OT Time Calculation (min): 27 min Charges:  OT General Charges $OT Visit: 1 Visit OT Evaluation $OT Eval Moderate Complexity: 1 Mod  [employer], OTR/L,CBIS  Supplemental OT - [facility] and [facility] Secure Chat Preferred    Aariyah Sampey, Clarene Duke 03/14/2022, 9:45 AM

## 2022-03-15 ENCOUNTER — Other Ambulatory Visit: Payer: BC Managed Care – PPO

## 2022-03-15 LAB — GLUCOSE, CAPILLARY
Glucose-Capillary: 160 mg/dL — ABNORMAL HIGH (ref 70–99)
Glucose-Capillary: 140 mg/dL — ABNORMAL HIGH (ref 70–99)
Glucose-Capillary: 191 mg/dL — ABNORMAL HIGH (ref 70–99)
Glucose-Capillary: 112 mg/dL — ABNORMAL HIGH (ref 70–99)
Glucose-Capillary: 157 mg/dL — ABNORMAL HIGH (ref 70–99)

## 2022-03-15 NOTE — Progress Notes (Signed)
Physical Therapy Treatment Patient Details Name: Kenneth Mcbride MRN: [id number] DOB: 1985-11-24 Today's Date: 03/15/2022   History of Present Illness Pt is 37 yo male who presents on 03/10/22 for thoracic laminectomy and medial facetectomy and foraminotomies T7-12 due to progressive thoracic myelopathy with gait disturbance. PMH: ADHD, DM, HTN, fatty liver, anxiety and depression, L2-5 lumbar lam 2021    PT Comments    Patient progressing with mobility.  Able to negotiate step appropriate for home entry with RW.  Still some difficulty with transfer, though overall S level.  Noted MD plans one more day, will see if mobility team can see this pm.  PT to follow up.    Recommendations for follow up therapy are one component of a multi-disciplinary discharge planning process, led by the attending physician.  Recommendations may be updated based on patient status, additional functional criteria and insurance authorization.  Follow Up Recommendations  Outpatient PT     Assistance Recommended at Discharge Intermittent Supervision/Assistance  Patient can return home with the following A little help with walking and/or transfers;A little help with bathing/dressing/bathroom;Assistance with cooking/housework;Assist for transportation;Help with stairs or ramp for entrance   Equipment Recommendations  None recommended by PT    Recommendations for Other Services       Precautions / Restrictions Precautions Precautions: Back Restrictions Weight Bearing Restrictions: No     Mobility  Bed Mobility Overal bed mobility: Modified Independent             General bed mobility comments: performing on his own as PT entered; had called nursing to help to bathroom    Transfers Overall transfer level: Needs assistance Equipment used: Rolling walker (2 wheels) Transfers: Sit to/from Stand Sit to Stand: Min guard, Supervision, From elevated surface           General transfer comment:  appropriate technique, increased time despite elevating bed some minguard for balance, up from toilet with grabbar with S    Ambulation/Gait     Assistive device: Rolling walker (2 wheels) Gait Pattern/deviations: Step-through pattern, Decreased dorsiflexion - right, Decreased dorsiflexion - left, Decreased stride length, Wide base of support       General Gait Details: adjusted walker for height, mild adductor tendency with some spasticity   Stairs Stairs: Yes Stairs assistance: Min guard Stair Management: Forwards, With walker Number of Stairs: 1 (x 2) General stair comments: assist to stabilize, cues for sequence   Wheelchair Mobility    Modified Rankin (Stroke Patients Only)       Balance Overall balance assessment: Needs assistance   Sitting balance-Leahy Scale: Good     Standing balance support: Bilateral upper extremity supported, Single extremity supported, During functional activity Standing balance-Leahy Scale: Poor Standing balance comment: UE support alternating during LB dressing pulling up shorts after toileting, washed hands at sink with increased time 1 UEs upport                            Cognition Arousal/Alertness: Awake/alert Behavior During Therapy: WFL for tasks assessed/performed Overall Cognitive Status: Within Functional Limits for tasks assessed                                          Exercises Other Exercises Other Exercises: encouraged ankle DF/PF in sitting and hip abduction    General Comments General comments (skin integrity, edema, etc.):  Reports had sweating episode yesterday to MD during session      Pertinent Vitals/Pain Pain Assessment Faces Pain Scale: Hurts little more Pain Location: thoracic back Pain Descriptors / Indicators: Operative site guarding Pain Intervention(s): Monitored during session    Home Living                          Prior Function            PT Goals  (current goals can now be found in the care plan section) Progress towards PT goals: Progressing toward goals    Frequency    Min 5X/week      PT Plan Current plan remains appropriate    Co-evaluation              AM-PAC PT "6 Clicks" Mobility   Outcome Measure  Help needed turning from your back to your side while in a flat bed without using bedrails?: None Help needed moving from lying on your back to sitting on the side of a flat bed without using bedrails?: None Help needed moving to and from a bed to a chair (including a wheelchair)?: A Little Help needed standing up from a chair using your arms (e.g., wheelchair or bedside chair)?: A Little Help needed to walk in hospital room?: A Little Help needed climbing 3-5 steps with a railing? : A Lot 6 Click Score: 19    End of Session   Activity Tolerance: Patient tolerated treatment well Patient left: in chair;with call bell/phone within reach   PT Visit Diagnosis: Unsteadiness on feet (R26.81);History of falling (Z91.81);Difficulty in walking, not elsewhere classified (R26.2);Pain Pain - part of body:  (back)     Time: 0623-7628 PT Time Calculation (min) (ACUTE ONLY): 28 min  Charges:  $Gait Training: 8-22 mins $Therapeutic Activity: 8-22 mins                     Magda Kiel, PT Acute Rehabilitation Services Office:[phone] 03/15/2022    Reginia Naas 03/15/2022, 10:24 AM

## 2022-03-15 NOTE — Progress Notes (Signed)
Patient ID: Sameer Teeple, male   DOB: Nov 20, 1985, 37 y.o.   MRN: [id number] Subjective: Patient reports he is doing well.  He has appropriate back soreness.  He feels like he has more sensation in his legs than before surgery.  He is working with physical therapy right now.  He is stepping up on a step and using his walker.  Has no headache.  He is not describing leg pain.  Objective: Vital signs in last 24 hours: Temp:  [97.8 F (36.6 C)-98.4 F (36.9 C)] 98 F (36.7 C) (01/17 0742) Pulse Rate:  [93-94] 94 (01/17 0742) Resp:  [16-18] 16 (01/17 0742) BP: (102-113)/(60-73) 113/73 (01/17 0434) SpO2:  [98 %-99 %] 99 % (01/17 0742)  Intake/Output from previous day: 01/16 0701 - 01/17 0700 In: -  Out: 700 [Urine:700] Intake/Output this shift: Total I/O In: -  Out: 600 [Urine:600]  His gait is still quite spastic but he appears to be walking somewhat better than before surgery.  His dressing is flat and dry.  Lab Results: Lab Results  Component Value Date   WBC 8.4 03/10/2022   HGB 15.9 03/10/2022   HCT 46.0 03/10/2022   MCV 81.6 03/10/2022   PLT 196 03/10/2022   Lab Results  Component Value Date   INR 1.2 03/10/2022   BMET Lab Results  Component Value Date   NA 133 (L) 03/10/2022   K 3.9 03/10/2022   CL 100 03/10/2022   CO2 23 03/10/2022   GLUCOSE 161 (H) 03/10/2022   BUN 10 03/10/2022   CREATININE 0.88 03/10/2022   CALCIUM 9.0 03/10/2022    Studies/Results: No results found.  Assessment/Plan: Overall I think he is doing very well.  I would like for him to stay for at least another day for therapy to improve his balance and his strength and make it safer for discharge home with his severe myelopathy.  There appears recommending home with home health rather than rehab and I think that is probably reasonable. Continue antispasmodics.  Continue to control sugars as best we can with sliding scale insulin.  Continue pain control.  Continue therapy  Estimated body mass  index is 39.33 kg/m as calculated from the following:   Height as of this encounter: 5\' 11"  (1.803 m).   Weight as of this encounter: 127.9 kg.    LOS: 5 days    Eustace Moore 03/15/2022, 10:26 AM

## 2022-03-15 NOTE — Progress Notes (Signed)
Mobility Specialist Progress Note   03/15/22 1545  Mobility  Activity Ambulated with assistance in hallway;Transferred from bed to chair  Level of Assistance Standby assist, set-up cues, supervision of patient - no hands on  Assistive Device Front wheel walker  Distance Ambulated (ft) 220 ft  Range of Motion/Exercises Active;All extremities  Activity Response Tolerated well   Patient received in supine and agreeable to participate. Was independent for bed mobility and stood with supervision requiring cues for hand placement and to widen BOS.  Ambulated supervision level with slow steady gait. Returned to room without complaint or incident. Was left in recliner with all needs met, call bell in reach.   [place] Aki Abalos, BS EXP Mobility Specialist Please contact via SecureChat or Rehab office at [phone]

## 2022-03-16 LAB — GLUCOSE, CAPILLARY
Glucose-Capillary: 196 mg/dL — ABNORMAL HIGH (ref 70–99)
Glucose-Capillary: 196 mg/dL — ABNORMAL HIGH (ref 70–99)

## 2022-03-16 MED ORDER — TIZANIDINE HCL 4 MG PO TABS: 4.0000 mg | ORAL_TABLET | Freq: Three times a day (TID) | ORAL | 1 refills | Status: DC

## 2022-03-16 MED ORDER — IBUPROFEN 800 MG PO TABS: 800.0000 mg | ORAL_TABLET | Freq: Three times a day (TID) | ORAL | 0 refills | Status: AC | PRN

## 2022-03-16 NOTE — Discharge Summary (Signed)
Physician Discharge Summary  Patient ID: Kenneth Mcbride MRN: [id number] DOB/AGE: 1985/04/30 37 y.o.  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 The patient tolerated the procedure well and was taken to the recovery room and then to the floor in stable condition.  He had an unintended durotomy at the time of surgery and remained flat bedrest for the first 2 or 3 days.  The hospital course was routine. There were no complications. The wound remained clean dry and intact. Pt had appropriate back soreness. No complaints of leg pain or new N/T/W.  In fact, the numbness and tingling in the legs was improved and his gait was somewhat improved.  The patient remained afebrile with stable vital signs, and tolerated a regular diet. The patient continued to increase activities, and pain was well controlled with oral pain medications.  He did well with physical and Occupational Therapy  Consults: None  Significant Diagnostic Studies:  Results for orders placed or performed during the hospital encounter of 03/10/22  Surgical pcr screen   Specimen: Nasal Mucosa; Nasal Swab  Result Value Ref Range   MRSA, PCR NEGATIVE NEGATIVE   Staphylococcus aureus NEGATIVE NEGATIVE  Protime-INR  Result Value Ref Range   Prothrombin Time 14.6 11.4 - 15.2 seconds   INR 1.2 0.8 - 1.2  Comprehensive metabolic panel per protocol  Result Value Ref Range   Sodium 133 (L) 135 - 145 mmol/L   Potassium 3.9 3.5 - 5.1 mmol/L   Chloride 100 98 - 111 mmol/L   CO2 23 22 - 32 mmol/L   Glucose, Bld 161 (H) 70 - 99 mg/dL   BUN 10 6 - 20 mg/dL   Creatinine, Ser 7.00 0.61 - 1.24 mg/dL   Calcium 9.0 8.9 - 17.4 mg/dL   Total Protein 6.8 6.5 -  8.1 g/dL   Albumin 4.2 3.5 - 5.0 g/dL   AST 63 (H) 15 - 41 U/L   ALT 77 (H) 0 - 44 U/L   Alkaline Phosphatase 91 38 - 126 U/L   Total Bilirubin 1.0 0.3 - 1.2 mg/dL   GFR, Estimated >94 >49 mL/min   Anion gap 10 5 - 15  CBC per protocol  Result Value Ref Range   WBC 8.4 4.0 - 10.5 K/uL   RBC 5.64 4.22 - 5.81 MIL/uL   Hemoglobin 15.9 13.0 - 17.0 g/dL   HCT 67.5 91.6 - 38.4 %   MCV 81.6 80.0 - 100.0 fL   MCH 28.2 26.0 - 34.0 pg   MCHC 34.6 30.0 - 36.0 g/dL   RDW 66.5 99.3 - 57.0 %   Platelets 196 150 - 400 K/uL   nRBC 0.0 0.0 - 0.2 %  Glucose, capillary  Result Value Ref Range   Glucose-Capillary 158 (H) 70 - 99 mg/dL   Comment 1 Notify RN   Glucose, capillary  Result Value Ref Range   Glucose-Capillary 121 (H) 70 - 99 mg/dL  Glucose, capillary  Result Value Ref Range   Glucose-Capillary 189 (H) 70 - 99 mg/dL  Glucose, capillary  Result Value Ref Range   Glucose-Capillary 187 (H) 70 - 99 mg/dL  Glucose, capillary  Result Value Ref Range   Glucose-Capillary 286 (H) 70 - 99 mg/dL  Glucose, capillary  Result Value Ref Range  Glucose-Capillary 323 (H) 70 - 99 mg/dL  Glucose, capillary  Result Value Ref Range   Glucose-Capillary 334 (H) 70 - 99 mg/dL  Glucose, capillary  Result Value Ref Range   Glucose-Capillary 282 (H) 70 - 99 mg/dL  Glucose, capillary  Result Value Ref Range   Glucose-Capillary 310 (H) 70 - 99 mg/dL  Glucose, capillary  Result Value Ref Range   Glucose-Capillary 233 (H) 70 - 99 mg/dL  Glucose, capillary  Result Value Ref Range   Glucose-Capillary 261 (H) 70 - 99 mg/dL  Glucose, capillary  Result Value Ref Range   Glucose-Capillary 207 (H) 70 - 99 mg/dL  Glucose, capillary  Result Value Ref Range   Glucose-Capillary 309 (H) 70 - 99 mg/dL  Glucose, capillary  Result Value Ref Range   Glucose-Capillary 289 (H) 70 - 99 mg/dL  Glucose, capillary  Result Value Ref Range   Glucose-Capillary 257 (H) 70 - 99 mg/dL  Glucose, capillary  Result  Value Ref Range   Glucose-Capillary 179 (H) 70 - 99 mg/dL  Glucose, capillary  Result Value Ref Range   Glucose-Capillary 226 (H) 70 - 99 mg/dL  Glucose, capillary  Result Value Ref Range   Glucose-Capillary 145 (H) 70 - 99 mg/dL  Glucose, capillary  Result Value Ref Range   Glucose-Capillary 181 (H) 70 - 99 mg/dL  Glucose, capillary  Result Value Ref Range   Glucose-Capillary 118 (H) 70 - 99 mg/dL  Glucose, capillary  Result Value Ref Range   Glucose-Capillary 212 (H) 70 - 99 mg/dL  Glucose, capillary  Result Value Ref Range   Glucose-Capillary 160 (H) 70 - 99 mg/dL  Glucose, capillary  Result Value Ref Range   Glucose-Capillary 140 (H) 70 - 99 mg/dL  Glucose, capillary  Result Value Ref Range   Glucose-Capillary 191 (H) 70 - 99 mg/dL  Glucose, capillary  Result Value Ref Range   Glucose-Capillary 112 (H) 70 - 99 mg/dL  Glucose, capillary  Result Value Ref Range   Glucose-Capillary 157 (H) 70 - 99 mg/dL  Glucose, capillary  Result Value Ref Range   Glucose-Capillary 196 (H) 70 - 99 mg/dL    DG THORACOLUMABAR SPINE  Result Date: 03/10/2022 CLINICAL DATA:  Surgery EXAM: THORACOLUMBAR SPINE 1V COMPARISON:  10/04/2021 FINDINGS: Single intraoperative spot image is submitted for intraoperative localization. Appears to be a surgical instrument overlying the T12 level although exact localization is difficult on the spot image. IMPRESSION: Intraoperative localization as above. Electronically Signed   By: Charlett Nose M.D.   On: 03/10/2022 19:08   DG C-Arm 1-60 Min-No Report  Result Date: 03/10/2022 Fluoroscopy was utilized by the requesting physician.  No radiographic interpretation.   DG C-Arm 1-60 Min-No Report  Result Date: 03/10/2022 Fluoroscopy was utilized by the requesting physician.  No radiographic interpretation.   DG C-Arm 1-60 Min-No Report  Result Date: 03/10/2022 Fluoroscopy was utilized by the requesting physician.  No radiographic interpretation.   CT  THORACIC SPINE WO CONTRAST  Result Date: 03/08/2022 CLINICAL DATA:  37 year old male with multilevel thoracic spinal stenosis on MRI this month. Chronic pain, unsteady gait. EXAM: CT THORACIC SPINE WITHOUT CONTRAST TECHNIQUE: Multidetector CT images of the thoracic were obtained using the standard protocol without intravenous contrast. RADIATION DOSE REDUCTION: This exam was performed according to the departmental dose-optimization program which includes automated exposure control, adjustment of the mA and/or kV according to patient size and/or use of iterative reconstruction technique. COMPARISON:  Recent thoracic MRI 03/06/2022. FINDINGS: Limited cervical spine imaging: Cervicothoracic junction alignment  is within normal limits. Thoracic spine segmentation:  Normal. Alignment: Stable thoracic kyphosis to the recent MRI. No significant scoliosis or spondylolisthesis. Vertebrae: No acute osseous abnormality identified. Background bone mineralization within normal limits. T6-T7 interbody ankylosis from bulky anterolateral bridging endplate osteophyte (series 7, image 38). Superimposed costovertebral junction spurring at both levels greater on the left., and also at the left T8-T9 level. Other bulky ossified posterior element hypertrophy and degeneration, see details below. Paraspinal and other soft tissues: Mild respiratory motion. Visible major airways and lung parenchyma are clear. No evidence of pericardial effusion. There is a trace layering right pleural effusion (series 3, image 99). No left pleural fluid. Effusion. Negative visible noncontrast mediastinum. Evidence of elevated right hemidiaphragm with subjacent fatty liver. Negative visible noncontrast spleen, pancreas, adrenal glands, kidneys, and bowel in the upper abdomen. Disc levels: Disc and disc space detail is superior on the recent thoracic MRI, but CT well demonstrates the following bulky and ossified posterior element degeneration contributing to  spinal stenosis: T3-T4: Moderate facet hypertrophy and severe calcified ligament flavum hypertrophy. See series 7, image 44 and series 5, image 36 T4-T5: Moderate to severe facet hypertrophy greater on the right and moderate bulky calcified ligament flavum hypertrophy (series 5, image 46). T5-T6: Moderate to severe facet hypertrophy. Some vacuum facet on the left. Moderate calcified ligament flavum hypertrophy (series 5, image 59 and series 7, image 44. T6-T7: Interbody ankylosis. But moderate to severe facet hypertrophy and severe left T6 foraminal stenosis as seen on series 7, image 49. T7-T8: Severe facet hypertrophy on the right, moderate on the left, and moderate to severe right greater than left calcified ligament flavum hypertrophy. Severe right T7 foraminal stenosis. See series 5, image 83 and series 7, image 44. T8-T9: Severe calcified ligament flavum and left greater than right facet hypertrophy. See series 5, image 96 and sagittal image 47. Severe left T8 foraminal stenosis. T9-T10: Severe calcified ligament flavum hypertrophy with critical spinal stenosis on series 5, image 109. Moderate to severe bilateral T9 foraminal stenosis. T10-T11: Severe calcified ligament flavum hypertrophy and at least moderate bilateral facet hypertrophy with critical spinal stenosis (series 5, image 127 and sagittal image 45. T11-T12: Severe calcified ligament flavum hypertrophy. Moderate to severe foraminal stenosis. See series 5, image 141 and sagittal image 46. T12-L1: Bulky facet and calcified ligament flavum hypertrophy with evidence of posterior element ankylosis greater on the left. IMPRESSION: 1. No acute osseous abnormality in the thoracic spine. Interbody ankylosis at T6-T7. And evidence of thoracolumbar junction posterior element ankylosis. Multilevel additional moderate and severe ossified ligament flavum and facet hypertrophy (see #2). 2. Bulky and ossified multilevel thoracic posterior element hypertrophy  results in widespread spinal stenosis, and critical stenosis at T9-T10 and T10-T11. 3. Associated severe neural foraminal stenosis at the left T6, right T7, left T8, and bilateral T9 nerve levels. 4. Trace layering right pleural effusion. Elevated right hemidiaphragm with Fatty liver. Electronically Signed   By: Genevie Ann M.D.   On: 03/08/2022 06:52   MR THORACIC SPINE WO CONTRAST  Result Date: 03/06/2022 CLINICAL DATA:  Initial evaluation for numbness and weakness in bilateral upper and lower extremities. Hyper reflexia. EXAM: MRI THORACIC SPINE WITHOUT CONTRAST TECHNIQUE: Multiplanar, multisequence MR imaging of the thoracic spine was performed. No intravenous contrast was administered. COMPARISON:  None available. FINDINGS: Alignment: Physiologic with preservation of the normal thoracic kyphosis. No listhesis. Vertebrae: Vertebral body height maintained without acute or chronic fracture. Multiple scattered chronic endplate Schmorl's node deformities noted throughout the mid and  lower thoracic spine. Underlying bone marrow signal intensity within normal limits. Small benign hemangioma noted within the T1 vertebral body. No other discrete or worrisome osseous lesions. No abnormal marrow edema. Cord: Suspected patchy signal abnormality seen involving the dorsal cord at the levels of T7-8 through T10-11, suspicious for compressive myelomalacia. Paraspinal and other soft tissues: Paraspinous soft tissues within normal limits. Small layering bilateral pleural effusions noted. Disc levels: T1-2: Normal interspace. Right worse than left facet hypertrophy. No spinal stenosis. Moderate right with mild-to-moderate left foraminal narrowing. T2-3: Left paracentral disc protrusion indents the left ventral thecal sac (series 20, image 8). Minimal cord flattening without cord signal changes. Right worse than left facet hypertrophy. No significant spinal stenosis. Mild right worse than left foraminal narrowing. T3-4: Normal  interspace. Moderate posterior element hypertrophy. Resultant mild spinal stenosis. Foramina remain patent. T4-5: Normal interspace. Moderate bilateral facet hypertrophy. No significant canal or foraminal stenosis. T5-6: Normal interspace. Pain moderate bilateral facet hypertrophy. No significant canal or foraminal stenosis. T6-7: Degenerative endplate spurring without significant disc bulge. Moderate facet hypertrophy. No significant spinal stenosis. Foramina remain patent. T7-8: Degenerative interval endplate spurring. Superimposed right paracentral disc extrusion with inferior migration indents the right ventral thecal sac. Secondary flattening of the right ventral cord. Moderate right worse than left facet hypertrophy. Resultant moderate spinal stenosis. Severe right foraminal narrowing. Left neural foramina remains patent. T8-9: Chronic endplate Schmorl's node deformities without significant disc bulge. Moderate posterior element hypertrophy. Resultant mild-to-moderate spinal stenosis. Moderate left foraminal narrowing. Right neural foramen remains patent. T9-10: Negative interspace. Advanced posterior element hypertrophy. Resultant moderate to severe spinal stenosis with mild cord flattening. Mild right with moderate left foraminal stenosis. T10-11: Mild disc desiccation with annular disc bulge. Advanced posterior element hypertrophy. Resultant severe spinal stenosis with secondary cord compression and probable cord signal changes. Thecal sac measures 2 mm in AP diameter at its most narrow point. Mild to moderate bilateral foraminal narrowing. T11-12: Chronic endplate Schmorl's node deformities without significant disc bulge. Moderate facet hypertrophy. Resultant mild-to-moderate spinal stenosis. Foramina remain patent. T12-L1: Mild endplate spurring without significant disc bulge. Mild facet hypertrophy. No spinal stenosis. Foramina remain patent. IMPRESSION: 1. Multilevel degenerative spondylosis and facet  arthrosis at T7-8 through T10-11 with resultant diffuse spinal stenosis as above, most severe at T10-11. 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 2. Multifactorial degenerative changes with resultant multilevel foraminal narrowing as above. Notable findings include moderate right foraminal stenosis at T1-2, severe right foraminal narrowing at T7-8, with moderate left foraminal narrowing at T8-9 through T10-11. Electronically Signed   By: Rise Mu M.D.   On: 03/06/2022 18:30   MR CERVICAL SPINE WO CONTRAST  Result Date: 03/06/2022 CLINICAL DATA:  Initial evaluation for hyper reflexia, numbness and weakness in bilateral upper and lower extremities. EXAM: MRI CERVICAL SPINE WITHOUT CONTRAST TECHNIQUE: Multiplanar, multisequence MR imaging of the cervical spine was performed. No intravenous contrast was administered. COMPARISON:  Prior radiograph from 07/11/2021. FINDINGS: Alignment: Physiologic with preservation of cervical lordosis. No listhesis. Vertebrae: Vertebral body height maintained without acute or chronic fracture. Bone marrow signal intensity within normal limits. Small benign hemangioma noted within the T1 vertebral body. No other discrete or worrisome osseous lesions. No abnormal marrow edema. Cord: Normal signal and morphology. Posterior Fossa, vertebral arteries, paraspinal tissues: Visualized brain and posterior fossa within normal limits. Craniocervical junction normal. Paraspinous and prevertebral soft tissues within normal limits. Normal intravascular flow voids seen within the vertebral arteries bilaterally. Disc levels: C2-C3: Unremarkable. C3-C4: Right  foraminal disc osteophyte complex partially indents the right ventral thecal sac (series 9, image 16). No new significant spinal stenosis. Moderate to severe right C4 foraminal stenosis. Left neural foramen remains patent. C4-C5: Mild uncovertebral spurring without significant disc  bulge. No spinal stenosis. Foramina remain patent. C5-C6: Mild disc bulge with endplate and uncovertebral spurring. No significant spinal stenosis. Mild bilateral C6 foraminal narrowing. C6-C7: Anterior endplate spurring without significant disc bulge. No spinal stenosis. Foramina remain patent. C7-T1: Normal interspace. Mild facet hypertrophy. No significant canal or foraminal stenosis. IMPRESSION: 1. Normal MRI appearance of the cervical spinal cord. No cord signal changes to suggest myelopathy. 2. Right foraminal disc osteophyte complex at C3-4 with resultant moderate to severe right C4 foraminal stenosis. 3. Mild disc bulge with uncovertebral spurring at C5-6 with resultant mild bilateral C6 foraminal stenosis. Electronically Signed   By: Jeannine Boga M.D.   On: 03/06/2022 18:15    Antibiotics:  Anti-infectives (From admission, onward)    Start     Dose/Rate Route Frequency Ordered Stop   03/10/22 2200  ceFAZolin (ANCEF) IVPB 2g/100 mL premix        2 g 200 mL/hr over 30 Minutes Intravenous Every 8 hours 03/10/22 2002 03/11/22 0649   03/10/22 1115  ceFAZolin (ANCEF) IVPB 3g/100 mL premix        3 g 200 mL/hr over 30 Minutes Intravenous On call to O.R. 03/10/22 1110 03/10/22 1430   03/10/22 1115  ceFAZolin (ANCEF) 2-4 GM/100ML-% IVPB       Note to Pharmacy: [place]: cabinet override      03/10/22 1115 03/10/22 2329       Discharge Exam: Blood pressure 118/67, pulse 86, temperature 98.3 F (36.8 C), resp. rate 18, height 5\' 11"  (1.803 m), weight 127.9 kg, SpO2 100 %. Neurologic: Grossly normal with spastic gait Dressing clean dry and intact  Discharge Medications:   Allergies as of 03/16/2022       Reactions   Buspar [buspirone]    Made anxiety worse.         Medication List     STOP taking these medications    naproxen sodium 220 MG tablet Commonly known as: ALEVE       TAKE these medications    albuterol 108 (90 Base) MCG/ACT inhaler Commonly  known as: VENTOLIN HFA Inhale 2 puffs into the lungs every 6 (six) hours as needed.   AMBULATORY NON FORMULARY MEDICATION Glucometer lancets, strips to test blood sugar once a day for T2DM.   fluticasone 50 MCG/ACT nasal spray Commonly known as: FLONASE Place 2 sprays into both nostrils daily. What changed:  when to take this reasons to take this   ibuprofen 800 MG tablet Commonly known as: ADVIL Take 1 tablet (800 mg total) by mouth every 8 (eight) hours as needed.   lisinopril 2.5 MG tablet Commonly known as: ZESTRIL Take 1 tablet (2.5 mg total) by mouth daily.   metFORMIN 500 MG tablet Commonly known as: GLUCOPHAGE Take 1 tablet (500 mg total) by mouth 2 (two) times daily with a meal.   multivitamin with minerals tablet Take 1 tablet by mouth daily.   pregabalin 75 MG capsule Commonly known as: LYRICA Take 75 mg by mouth daily.   rosuvastatin 10 MG tablet Commonly known as: Crestor Take 1 tablet (10 mg total) by mouth daily.   SUMAtriptan 50 MG tablet Commonly known as: IMITREX Take 1 tab (50 mg) by mouth for migraine headache. May repeat dose one time if  headache has not subsided in 2 hours. Do not take more than 2 tabs (100 mg) in a 24 hour period.   tirzepatide 7.5 MG/0.5ML Pen Commonly known as: MOUNJARO Inject 7.5 mg into the skin once a week.   tiZANidine 4 MG tablet Commonly known as: ZANAFLEX Take 1 tablet (4 mg total) by mouth 3 (three) times daily.        Disposition: Home   Final Dx: Facet laminectomy for spinal stenosis and myelopathy  Discharge Instructions     Call MD for:  difficulty breathing, headache or visual disturbances   Complete by: As directed    Call MD for:  persistant dizziness or light-headedness   Complete by: As directed    Call MD for:  persistant nausea and vomiting   Complete by: As directed    Call MD for:  redness, tenderness, or signs of infection (pain, swelling, redness, odor or green/yellow discharge around  incision site)   Complete by: As directed    Call MD for:  severe uncontrolled pain   Complete by: As directed    Call MD for:  temperature >100.4   Complete by: As directed    Diet - low sodium heart healthy   Complete by: As directed    Discharge instructions   Complete by: As directed    May shower normally, remove dressing from incision in a couple of days, leave Steri-Strips in place, strenuous activity.   Increase activity slowly   Complete by: As directed         Follow-up Information     Tia AlertJones, Finesse Fielder S, MD. Schedule an appointment as soon as possible for a visit in 2 week(s).   Specialty: Neurosurgery Contact information: [address] [place] [address] [phone]                  Signed: Tia AlertDavid S [place] 03/16/2022, 7:53 AM

## 2022-03-16 NOTE — TOC Transition Note (Signed)
Transition of Care [facility]) - CM/SW Discharge Note   Patient Details  Name: Kenneth Mcbride MRN: [id number] Date of Birth: Nov 03, 1985  Transition of Care [facility]) CM/SW Contact:  Sharin Mons, RN Phone Number: 03/16/2022, 10:15 AM   Clinical Narrative:    Patient will DC to: home Anticipated DC date: 03/16/2022 Family notified: yes Transport by: car        -s/p  Decompressive thoracic laminectomy and medial facetectomy and foraminotomies T7-8, T8-9, T9-10,  T10-11, T11-12 bilateral, 03/10/2022   Per MD patient ready for DC today. RN, patient, and patient's family aware of DC.Order noted for home health services. Pt agreeable. Choice provided. Pt states ok with any provider as long as provider is in network with insurance. Referral made with [facility] and accepted.  Pt without DME needs. Has RW @ home. Pt states exchange students and daughter to assist with care once d/c. Pt without RX med concerns. Post hospital f/u noted on AVS.  Pt with transportation to home.  RNCM will sign off for now as intervention is no longer needed. Please consult [place] again if new needs arise.    Final next level of care: [place] Barriers to Discharge: No Barriers Identified   Patient Goals and CMS Choice   Choice offered to / list presented to : Patient  Discharge Placement                         Discharge Plan and Services Additional resources added to the After Visit Summary for                            [facility] Arranged: PT, OT [facility] Agency: [place] [facility] Date HH Agency Contacted: 03/16/22 Time [place]: [place] Representative spoke with at [place]: [place] (SDOH) Interventions SDOH Screenings   Depression (PHQ2-9): Medium Risk (02/07/2022)  Tobacco Use: Low Risk  (03/14/2022)     Readmission Risk Interventions     No data to display

## 2022-03-16 NOTE — Progress Notes (Signed)
Physical Therapy Treatment Patient Details Name: Kenneth Mcbride MRN: [id number] DOB: April 07, 1985 Today's Date: 03/16/2022   History of Present Illness Pt is 37 yo male who presents on 03/10/22 for thoracic laminectomy and medial facetectomy and foraminotomies T7-12 due to progressive thoracic myelopathy with gait disturbance. PMH: ADHD, DM, HTN, fatty liver, anxiety and depression, L2-5 lumbar lam 2021    PT Comments    Pt is progressing with goals. Overall pt is supervision for all functional activities and is aware of safety and deficits. Due to pt current functional status recommending OPPT once medically cleared in order to return to PLOF and age related activities. Pt stand point pt is functionally able to navigate home environment with minimal assistance on discharge from acute care hospital setting and cleared for home pending medical clearance.     Recommendations for follow up therapy are one component of a multi-disciplinary discharge planning process, led by the attending physician.  Recommendations may be updated based on patient status, additional functional criteria and insurance authorization.  Follow Up Recommendations  Outpatient PT     Assistance Recommended at Discharge Intermittent Supervision/Assistance  Patient can return home with the following A little help with walking and/or transfers;Assistance with cooking/housework;Assist for transportation;Help with stairs or ramp for entrance   Equipment Recommendations  None recommended by PT    Recommendations for Other Services       Precautions / Restrictions Precautions Precautions: Back Precaution Booklet Issued: No Precaution Comments: Reviewed precautions Restrictions Weight Bearing Restrictions: No     Mobility  Bed Mobility Overal bed mobility: Needs Assistance         Sit to supine: Supervision   General bed mobility comments: Extra time due to weakness in the RLE; discussed using straps or the  opposite LE if able to lift RLE into the bed. Patient Response: Cooperative  Transfers Overall transfer level: Needs assistance Equipment used: Rolling walker (2 wheels) Transfers: Sit to/from Stand Sit to Stand: Supervision   Step pivot transfers: Supervision       General transfer comment: Pt demonstrates good technique no overt LOB. Low foot clearance.    Ambulation/Gait Ambulation/Gait assistance: Supervision Gait Distance (Feet): 200 Feet Assistive device: Rolling walker (2 wheels) Gait Pattern/deviations: Step-through pattern, Decreased dorsiflexion - right, Decreased dorsiflexion - left, Decreased stride length, Wide base of support Gait velocity: Decreased cadence. Gait velocity interpretation: <1.8 ft/sec, indicate of risk for recurrent falls   General Gait Details: initially pt demonstrates some foot drag on the RLE that improved with distance. Discussed techniques to improve gait and decrease risk for foot drag/falls including taking larger/higher steps than he feels he needs to take.   Stairs         General stair comments: Pt states that he feels very comfortable ascending stairs and a little less comfortable descending but overall has no questions.   Wheelchair Mobility    Modified Rankin (Stroke Patients Only)       Balance Overall balance assessment: Needs assistance Sitting-balance support: No upper extremity supported, Feet supported Sitting balance-Leahy Scale: Good     Standing balance support: Bilateral upper extremity supported, Single extremity supported, During functional activity Standing balance-Leahy Scale: Fair Standing balance comment: Pt had no overt loss of balance with activities; relies on UE support for standing due to weakness and altered sensation in bil LE      Cognition Arousal/Alertness: Awake/alert Behavior During Therapy: WFL for tasks assessed/performed Overall Cognitive Status: Within Functional Limits for tasks  assessed  General Comments General comments (skin integrity, edema, etc.): Pt overall progressing well. Discussed car transfers including adjusting seat and giving himself room to get into the car in order to maintain back precautions. No holding onto the door for support.      Pertinent Vitals/Pain Pain Assessment Pain Assessment: Faces Faces Pain Scale: Hurts a little bit Pain Descriptors / Indicators: Operative site guarding Pain Intervention(s): Monitored during session     PT Goals (current goals can now be found in the care plan section) Acute Rehab PT Goals Patient Stated Goal: be able to throw the softball with his exchange student PT Goal Formulation: With patient Time For Goal Achievement: 03/28/22 Potential to Achieve Goals: Good Progress towards PT goals: Progressing toward goals    Frequency    Min 5X/week      PT Plan Current plan remains appropriate       AM-PAC PT "6 Clicks" Mobility   Outcome Measure  Help needed turning from your back to your side while in a flat bed without using bedrails?: None Help needed moving from lying on your back to sitting on the side of a flat bed without using bedrails?: None Help needed moving to and from a bed to a chair (including a wheelchair)?: A Little Help needed standing up from a chair using your arms (e.g., wheelchair or bedside chair)?: A Little Help needed to walk in hospital room?: A Little Help needed climbing 3-5 steps with a railing? : A Lot 6 Click Score: 19    End of Session Equipment Utilized During Treatment: Gait belt Activity Tolerance: Patient tolerated treatment well Patient left: in bed;with call bell/phone within reach Nurse Communication: Mobility status PT Visit Diagnosis: Unsteadiness on feet (R26.81);History of falling (Z91.81);Difficulty in walking, not elsewhere classified (R26.2);Pain Pain - part of body:  (back)     Time: 2595-6387 PT Time Calculation (min) (ACUTE  ONLY): 16 min  Charges:  $Gait Training: 8-22 mins                     Tomma Rakers, DPT, [place] Office: [phone] (Secure chat preferred)    Ander Purpura 03/16/2022, 10:57 AM

## 2022-03-16 NOTE — Progress Notes (Signed)
AVS reviewed with patient.  All questions answered.

## 2022-03-16 NOTE — Progress Notes (Signed)
Patient was offered the opportunity to receive help to get a bath and have his linens changed three times by Tech this morning.  Patient declined three times.

## 2022-03-16 NOTE — Plan of Care (Signed)
Education adequate for discharge 

## 2022-03-16 NOTE — Progress Notes (Signed)
Discharge instructions are given to the patient. All questions are answered. Pt states his ride here will be around noon.

## 2022-03-17 ENCOUNTER — Telehealth: Payer: Self-pay | Admitting: General Practice

## 2022-03-17 DIAGNOSIS — Z791 Long term (current) use of non-steroidal anti-inflammatories (NSAID): Secondary | ICD-10-CM | POA: Diagnosis not present

## 2022-03-17 DIAGNOSIS — Z7984 Long term (current) use of oral hypoglycemic drugs: Secondary | ICD-10-CM | POA: Diagnosis not present

## 2022-03-17 DIAGNOSIS — Z9181 History of falling: Secondary | ICD-10-CM | POA: Diagnosis not present

## 2022-03-17 DIAGNOSIS — Z981 Arthrodesis status: Secondary | ICD-10-CM | POA: Diagnosis not present

## 2022-03-17 DIAGNOSIS — M4804 Spinal stenosis, thoracic region: Secondary | ICD-10-CM | POA: Diagnosis not present

## 2022-03-17 DIAGNOSIS — F419 Anxiety disorder, unspecified: Secondary | ICD-10-CM | POA: Diagnosis not present

## 2022-03-17 DIAGNOSIS — E119 Type 2 diabetes mellitus without complications: Secondary | ICD-10-CM | POA: Diagnosis not present

## 2022-03-17 DIAGNOSIS — M1990 Unspecified osteoarthritis, unspecified site: Secondary | ICD-10-CM | POA: Diagnosis not present

## 2022-03-17 DIAGNOSIS — F909 Attention-deficit hyperactivity disorder, unspecified type: Secondary | ICD-10-CM | POA: Diagnosis not present

## 2022-03-17 DIAGNOSIS — F32A Depression, unspecified: Secondary | ICD-10-CM | POA: Diagnosis not present

## 2022-03-17 DIAGNOSIS — Z4789 Encounter for other orthopedic aftercare: Secondary | ICD-10-CM | POA: Diagnosis not present

## 2022-03-17 DIAGNOSIS — I10 Essential (primary) hypertension: Secondary | ICD-10-CM | POA: Diagnosis not present

## 2022-03-17 DIAGNOSIS — M4714 Other spondylosis with myelopathy, thoracic region: Secondary | ICD-10-CM | POA: Diagnosis not present

## 2022-03-17 DIAGNOSIS — K760 Fatty (change of) liver, not elsewhere classified: Secondary | ICD-10-CM | POA: Diagnosis not present

## 2022-03-17 DIAGNOSIS — Z7985 Long-term (current) use of injectable non-insulin antidiabetic drugs: Secondary | ICD-10-CM | POA: Diagnosis not present

## 2022-03-17 NOTE — Telephone Encounter (Signed)
Transition Care Management Unsuccessful Follow-up Telephone Call  Date of discharge and from where:  03/16/22 from [facility]  Attempts:  1st Attempt  Reason for unsuccessful TCM follow-up call:  Left voice message

## 2022-03-20 MED ORDER — TIRZEPATIDE 5 MG/0.5ML ~~LOC~~ SOAJ: 5.0000 mg | SUBCUTANEOUS | 0 refills | Status: DC

## 2022-03-20 NOTE — Addendum Note (Signed)
Addended by: Donella Stade on: 03/20/2022 04:22 PM   Modules accepted: Orders

## 2022-03-20 NOTE — Telephone Encounter (Signed)
Transition Care Management Unsuccessful Follow-up Telephone Call  Date of discharge and from where:  03/16/22 from [facility]  Attempts:  2nd Attempt  Reason for unsuccessful TCM follow-up call:  No answer/busy

## 2022-03-24 NOTE — Telephone Encounter (Signed)
Transition Care Management Unsuccessful Follow-up Telephone Call  Date of discharge and from where:  03/16/22 from [facility]  Attempts:  3rd Attempt  Reason for unsuccessful TCM follow-up call:  No answer/busy

## 2022-03-27 DIAGNOSIS — E119 Type 2 diabetes mellitus without complications: Secondary | ICD-10-CM | POA: Diagnosis not present

## 2022-03-27 DIAGNOSIS — F419 Anxiety disorder, unspecified: Secondary | ICD-10-CM | POA: Diagnosis not present

## 2022-03-27 DIAGNOSIS — Z7985 Long-term (current) use of injectable non-insulin antidiabetic drugs: Secondary | ICD-10-CM | POA: Diagnosis not present

## 2022-03-27 DIAGNOSIS — M1990 Unspecified osteoarthritis, unspecified site: Secondary | ICD-10-CM | POA: Diagnosis not present

## 2022-03-27 DIAGNOSIS — Z791 Long term (current) use of non-steroidal anti-inflammatories (NSAID): Secondary | ICD-10-CM | POA: Diagnosis not present

## 2022-03-27 DIAGNOSIS — Z4789 Encounter for other orthopedic aftercare: Secondary | ICD-10-CM | POA: Diagnosis not present

## 2022-03-27 DIAGNOSIS — F909 Attention-deficit hyperactivity disorder, unspecified type: Secondary | ICD-10-CM | POA: Diagnosis not present

## 2022-03-27 DIAGNOSIS — M4804 Spinal stenosis, thoracic region: Secondary | ICD-10-CM | POA: Diagnosis not present

## 2022-03-27 DIAGNOSIS — Z7984 Long term (current) use of oral hypoglycemic drugs: Secondary | ICD-10-CM | POA: Diagnosis not present

## 2022-03-27 DIAGNOSIS — Z981 Arthrodesis status: Secondary | ICD-10-CM | POA: Diagnosis not present

## 2022-03-27 DIAGNOSIS — M4714 Other spondylosis with myelopathy, thoracic region: Secondary | ICD-10-CM | POA: Diagnosis not present

## 2022-03-27 DIAGNOSIS — F32A Depression, unspecified: Secondary | ICD-10-CM | POA: Diagnosis not present

## 2022-03-27 DIAGNOSIS — Z9181 History of falling: Secondary | ICD-10-CM | POA: Diagnosis not present

## 2022-03-27 DIAGNOSIS — K760 Fatty (change of) liver, not elsewhere classified: Secondary | ICD-10-CM | POA: Diagnosis not present

## 2022-03-27 DIAGNOSIS — I10 Essential (primary) hypertension: Secondary | ICD-10-CM | POA: Diagnosis not present

## 2022-03-31 ENCOUNTER — Ambulatory Visit: Payer: BC Managed Care – PPO | Admitting: Physical Therapy

## 2022-04-05 ENCOUNTER — Ambulatory Visit
Payer: BC Managed Care – PPO | Attending: Neurological Surgery | Admitting: Rehabilitative and Restorative Service Providers"

## 2022-04-05 ENCOUNTER — Encounter: Payer: Self-pay | Admitting: Rehabilitative and Restorative Service Providers"

## 2022-04-05 ENCOUNTER — Other Ambulatory Visit: Payer: Self-pay

## 2022-04-05 DIAGNOSIS — R29898 Other symptoms and signs involving the musculoskeletal system: Secondary | ICD-10-CM | POA: Diagnosis not present

## 2022-04-05 DIAGNOSIS — M546 Pain in thoracic spine: Secondary | ICD-10-CM | POA: Insufficient documentation

## 2022-04-05 DIAGNOSIS — R293 Abnormal posture: Secondary | ICD-10-CM | POA: Diagnosis not present

## 2022-04-05 DIAGNOSIS — R531 Weakness: Secondary | ICD-10-CM | POA: Diagnosis not present

## 2022-04-05 DIAGNOSIS — G959 Disease of spinal cord, unspecified: Secondary | ICD-10-CM | POA: Insufficient documentation
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
## 2022-04-11 ENCOUNTER — Encounter: Payer: Self-pay | Admitting: Physical Therapy

## 2022-04-11 ENCOUNTER — Ambulatory Visit: Payer: BC Managed Care – PPO | Admitting: Physical Therapy

## 2022-04-11 DIAGNOSIS — R293 Abnormal posture: Secondary | ICD-10-CM

## 2022-04-11 DIAGNOSIS — R29898 Other symptoms and signs involving the musculoskeletal system: Secondary | ICD-10-CM | POA: Diagnosis not present

## 2022-04-11 DIAGNOSIS — R531 Weakness: Secondary | ICD-10-CM | POA: Diagnosis not present

## 2022-04-11 DIAGNOSIS — M546 Pain in thoracic spine: Secondary | ICD-10-CM | POA: Diagnosis not present

## 2022-04-11 DIAGNOSIS — M5416 Radiculopathy, lumbar region: Secondary | ICD-10-CM

## 2022-04-11 DIAGNOSIS — G959 Disease of spinal cord, unspecified: Secondary | ICD-10-CM | POA: Diagnosis not present
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
## 2022-04-12 ENCOUNTER — Encounter: Payer: Self-pay | Admitting: Physical Medicine & Rehabilitation

## 2022-04-13 ENCOUNTER — Ambulatory Visit: Payer: BC Managed Care – PPO | Admitting: Physical Therapy

## 2022-04-13 ENCOUNTER — Encounter: Payer: Self-pay | Admitting: Physical Therapy

## 2022-04-13 DIAGNOSIS — R293 Abnormal posture: Secondary | ICD-10-CM | POA: Diagnosis not present

## 2022-04-13 DIAGNOSIS — R29898 Other symptoms and signs involving the musculoskeletal system: Secondary | ICD-10-CM

## 2022-04-13 DIAGNOSIS — M546 Pain in thoracic spine: Secondary | ICD-10-CM | POA: Diagnosis not present

## 2022-04-13 DIAGNOSIS — M5416 Radiculopathy, lumbar region: Secondary | ICD-10-CM

## 2022-04-13 DIAGNOSIS — M542 Cervicalgia: Secondary | ICD-10-CM

## 2022-04-13 DIAGNOSIS — R531 Weakness: Secondary | ICD-10-CM | POA: Diagnosis not present

## 2022-04-13 DIAGNOSIS — G959 Disease of spinal cord, unspecified: Secondary | ICD-10-CM | POA: Diagnosis not present
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
## 2022-04-17 ENCOUNTER — Ambulatory Visit: Payer: BC Managed Care – PPO | Admitting: Rehabilitative and Restorative Service Providers"

## 2022-04-17 ENCOUNTER — Encounter: Payer: Self-pay | Admitting: Rehabilitative and Restorative Service Providers"

## 2022-04-17 DIAGNOSIS — R29898 Other symptoms and signs involving the musculoskeletal system: Secondary | ICD-10-CM

## 2022-04-17 DIAGNOSIS — R293 Abnormal posture: Secondary | ICD-10-CM

## 2022-04-17 DIAGNOSIS — M546 Pain in thoracic spine: Secondary | ICD-10-CM

## 2022-04-17 DIAGNOSIS — R531 Weakness: Secondary | ICD-10-CM | POA: Diagnosis not present

## 2022-04-17 DIAGNOSIS — M5416 Radiculopathy, lumbar region: Secondary | ICD-10-CM

## 2022-04-17 DIAGNOSIS — G959 Disease of spinal cord, unspecified: Secondary | ICD-10-CM | POA: Diagnosis not present
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
## 2022-04-19 ENCOUNTER — Ambulatory Visit: Payer: BC Managed Care – PPO | Admitting: Rehabilitative and Restorative Service Providers"

## 2022-04-19 ENCOUNTER — Encounter: Payer: Self-pay | Admitting: Rehabilitative and Restorative Service Providers"

## 2022-04-19 DIAGNOSIS — G959 Disease of spinal cord, unspecified: Secondary | ICD-10-CM | POA: Diagnosis not present

## 2022-04-19 DIAGNOSIS — R293 Abnormal posture: Secondary | ICD-10-CM | POA: Diagnosis not present

## 2022-04-19 DIAGNOSIS — R531 Weakness: Secondary | ICD-10-CM | POA: Diagnosis not present

## 2022-04-19 DIAGNOSIS — M546 Pain in thoracic spine: Secondary | ICD-10-CM | POA: Diagnosis not present

## 2022-04-19 DIAGNOSIS — R29898 Other symptoms and signs involving the musculoskeletal system: Secondary | ICD-10-CM | POA: Diagnosis not present

## 2022-04-19 DIAGNOSIS — M5416 Radiculopathy, lumbar region: Secondary | ICD-10-CM
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
## 2022-04-21 ENCOUNTER — Ambulatory Visit: Payer: BC Managed Care – PPO

## 2022-04-21 DIAGNOSIS — R051 Acute cough: Secondary | ICD-10-CM | POA: Diagnosis not present

## 2022-04-21 DIAGNOSIS — J019 Acute sinusitis, unspecified: Secondary | ICD-10-CM | POA: Diagnosis not present

## 2022-04-24 ENCOUNTER — Encounter: Payer: BC Managed Care – PPO | Admitting: Rehabilitative and Restorative Service Providers"

## 2022-04-26 ENCOUNTER — Encounter: Payer: Self-pay | Admitting: Rehabilitative and Restorative Service Providers"

## 2022-04-26 ENCOUNTER — Ambulatory Visit: Payer: BC Managed Care – PPO | Admitting: Rehabilitative and Restorative Service Providers"

## 2022-04-26 DIAGNOSIS — M546 Pain in thoracic spine: Secondary | ICD-10-CM

## 2022-04-26 DIAGNOSIS — R29898 Other symptoms and signs involving the musculoskeletal system: Secondary | ICD-10-CM

## 2022-04-26 DIAGNOSIS — R531 Weakness: Secondary | ICD-10-CM

## 2022-04-26 DIAGNOSIS — R293 Abnormal posture: Secondary | ICD-10-CM

## 2022-04-26 DIAGNOSIS — G959 Disease of spinal cord, unspecified: Secondary | ICD-10-CM | POA: Diagnosis not present
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
## 2022-04-28 ENCOUNTER — Ambulatory Visit: Payer: BC Managed Care – PPO | Attending: Neurological Surgery | Admitting: Physical Therapy

## 2022-04-28 ENCOUNTER — Encounter: Payer: Self-pay | Admitting: Physical Therapy

## 2022-04-28 DIAGNOSIS — R531 Weakness: Secondary | ICD-10-CM | POA: Insufficient documentation

## 2022-04-28 DIAGNOSIS — M542 Cervicalgia: Secondary | ICD-10-CM | POA: Diagnosis not present

## 2022-04-28 DIAGNOSIS — M546 Pain in thoracic spine: Secondary | ICD-10-CM | POA: Diagnosis not present

## 2022-04-28 DIAGNOSIS — R29898 Other symptoms and signs involving the musculoskeletal system: Secondary | ICD-10-CM | POA: Diagnosis not present

## 2022-04-28 DIAGNOSIS — M5416 Radiculopathy, lumbar region: Secondary | ICD-10-CM | POA: Diagnosis not present

## 2022-04-28 DIAGNOSIS — R293 Abnormal posture: Secondary | ICD-10-CM | POA: Insufficient documentation
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
## 2022-05-02 ENCOUNTER — Encounter
Payer: BC Managed Care – PPO | Attending: Physical Medicine & Rehabilitation | Admitting: Physical Medicine & Rehabilitation

## 2022-05-02 ENCOUNTER — Encounter: Payer: Self-pay | Admitting: Physical Medicine & Rehabilitation

## 2022-05-02 VITALS — HR 96 | Ht 71.0 in | Wt 275.0 lb

## 2022-05-02 DIAGNOSIS — M4714 Other spondylosis with myelopathy, thoracic region: Secondary | ICD-10-CM | POA: Insufficient documentation

## 2022-05-02 MED ORDER — BACLOFEN 10 MG PO TABS: 10.0000 mg | ORAL_TABLET | Freq: Three times a day (TID) | ORAL | 0 refills | Status: DC

## 2022-05-02 NOTE — Patient Instructions (Signed)
Stop tizanidine and start baclofen today

## 2022-05-02 NOTE — Progress Notes (Signed)
Subjective:    Patient ID: Kenneth Mcbride, male    DOB: 05-13-1985, 37 y.o.   MRN: [id number]  HPI  37 year old male with history of thoracic myelopathy who was referred by Dr. Ronnald Ramp, [place] [facility] for spasticity management.  The patient underwent a T7-T12 laminectomy foraminotomy on 03/10/2022.  Lower extremity spasms  mainly at night ,  Spasms are mainly in the thigh , mainly in the front The patient does not have consistent pattern of spasms.  Sometimes he feels like his legs flex and sometimes he feels like they extend.  He does not have any shaking of the feet and ankles or of the legs.  He tends to have more spasms when he is laying supine in bed and does better in a side-lying position. He is going through outpatient therapy Spasms improve with stretching Is mod I from all self care and mobility   Takes tizanidine twice a day in am and before bed  MRI THORACIC SPINE WITHOUT CONTRAST   TECHNIQUE: Multiplanar, multisequence MR imaging of the thoracic spine was performed. No intravenous contrast was administered.   COMPARISON:  None available.   FINDINGS: Alignment: Physiologic with preservation of the normal thoracic kyphosis. No listhesis.   Vertebrae: Vertebral body height maintained without acute or chronic fracture. Multiple scattered chronic endplate Schmorl's node deformities noted throughout the mid and lower thoracic spine. Underlying bone marrow signal intensity within normal limits. Small benign hemangioma noted within the T1 vertebral body. No other discrete or worrisome osseous lesions. No abnormal marrow edema.   Cord: Suspected patchy signal abnormality seen involving the dorsal cord at the levels of T7-8 through T10-11, suspicious for compressive myelomalacia.  Activity level sleeps around 8 hours a day sits 10 hours a day stands 4 hours a day and walks about 2 hours a day. Currently taking anti-inflammatories 0 to 1 tablet/day Reviewed neurosurgery  notes notes sent by [place] [facility]   Paraspinal and other soft tissues: Paraspinous soft tissues within normal limits. Small layering bilateral pleural effusions noted.   Disc levels:   T1-2: Normal interspace. Right worse than left facet hypertrophy. No spinal stenosis. Moderate right with mild-to-moderate left foraminal narrowing.   T2-3: Left paracentral disc protrusion indents the left ventral thecal sac (series 20, image 8). Minimal cord flattening without cord signal changes. Right worse than left facet hypertrophy. No significant spinal stenosis. Mild right worse than left foraminal narrowing.   T3-4: Normal interspace. Moderate posterior element hypertrophy. Resultant mild spinal stenosis. Foramina remain patent.   T4-5: Normal interspace. Moderate bilateral facet hypertrophy. No significant canal or foraminal stenosis.   T5-6: Normal interspace. Pain moderate bilateral facet hypertrophy. No significant canal or foraminal stenosis.   T6-7: Degenerative endplate spurring without significant disc bulge. Moderate facet hypertrophy. No significant spinal stenosis. Foramina remain patent.   T7-8: Degenerative interval endplate spurring. Superimposed right paracentral disc extrusion with inferior migration indents the right ventral thecal sac. Secondary flattening of the right ventral cord. Moderate right worse than left facet hypertrophy. Resultant moderate spinal stenosis. Severe right foraminal narrowing. Left neural foramina remains patent.   T8-9: Chronic endplate Schmorl's node deformities without significant disc bulge. Moderate posterior element hypertrophy. Resultant mild-to-moderate spinal stenosis. Moderate left foraminal narrowing. Right neural foramen remains patent.   T9-10: Negative interspace. Advanced posterior element hypertrophy. Resultant moderate to severe spinal stenosis with mild cord flattening. Mild right with moderate left foraminal  stenosis.   T10-11: Mild disc desiccation with annular disc bulge. Advanced posterior element hypertrophy. Resultant  severe spinal stenosis with secondary cord compression and probable cord signal changes. Thecal sac measures 2 mm in AP diameter at its most narrow point. Mild to moderate bilateral foraminal narrowing.   T11-12: Chronic endplate Schmorl's node deformities without significant disc bulge. Moderate facet hypertrophy. Resultant mild-to-moderate spinal stenosis. Foramina remain patent.   T12-L1: Mild endplate spurring without significant disc bulge. Mild facet hypertrophy. No spinal stenosis. Foramina remain patent.   IMPRESSION: 1. Multilevel degenerative spondylosis and facet arthrosis at T7-8 through T10-11 with resultant diffuse spinal stenosis as above, most severe at T10-11. 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 2. Multifactorial degenerative changes with resultant multilevel foraminal narrowing as above. Notable findings include moderate right foraminal stenosis at T1-2, severe right foraminal narrowing at T7-8, with moderate left foraminal narrowing at T8-9 through T10-11.     Electronically Signed   By: Jeannine Boga M.D.   On: 03/06/2022 18:30 Independently reviewed the MRI of the thoracic spine listed above.  No additional findings noted Pain Inventory Average Pain 1 Pain Right Now 0 My pain is  NA  In the last 24 hours, has pain interfered with the following? General activity 1 Relation with others 0 Enjoyment of life 1 What TIME of day is your pain at its worst? night Sleep (in general) Fair  Pain is worse with: walking and bending Pain improves with: medication Relief from Meds: 3  walk without assistance how many minutes can you walk? 30 ability to climb steps?  yes do you drive?  yes Do you have any goals in this area?  yes  employed # of hrs/week .  numbness tremor  Any  changes since last visit?  no  Any changes since last visit?  no    Family History  Problem Relation Age of Onset   Diabetes Father    Heart disease Father    Diabetes Paternal Grandfather    COPD Paternal Grandfather    Social History   Socioeconomic History   Marital status: Single    Spouse name: Not on file   Number of children: Not on file   Years of education: Not on file   Highest education level: Not on file  Occupational History   Not on file  Tobacco Use   Smoking status: Never   Smokeless tobacco: Never  Vaping Use   Vaping Use: Never used  Substance and Sexual Activity   Alcohol use: Yes    Comment: ocassionally   Drug use: Never   Sexual activity: Not Currently    Partners: Female  Other Topics Concern   Not on file  Social History Narrative   Not on file   Social Determinants of Health   Financial Resource Strain: Not on file  Food Insecurity: Not on file  Transportation Needs: Not on file  Physical Activity: Not on file  Stress: Not on file  Social Connections: Not on file   Past Surgical History:  Procedure Laterality Date   LUMBAR LAMINECTOMY/DECOMPRESSION MICRODISCECTOMY N/A 08/27/2019   Procedure: LUMBAR 2-LUMBAR 5 DECOMPRESSION;  Surgeon: Phylliss Bob, MD;  Location: [place];  Service: Orthopedics;  Laterality: N/A;   LUMBAR LAMINECTOMY/DECOMPRESSION MICRODISCECTOMY N/A 03/10/2022   Procedure: Laminectomy and Foraminotomy - Thoracic Seven - Thoracic Twelve;  Surgeon: Eustace Moore, MD;  Location: [place];  Service: Neurosurgery;  Laterality: N/A;   Past Medical History:  Diagnosis Date   ADHD (attention deficit hyperactivity disorder)    ADHD   Anxiety  Arthritis    COVID    October 2021, February 2022 - 1st one was severe per pt. and 2nd one was mild   Depression    Diabetes mellitus without complication (HCC)    Family history of adverse reaction to anesthesia    father and grand father both heart stopped during surgery   Fatty  liver    Headache    Hypertension    No pertinent past medical history    Spinal stenosis    Pulse 96   Ht '5\' 11"'$  (1.803 m)   Wt 275 lb (124.7 kg)   SpO2 98%   BMI 38.35 kg/m   Opioid Risk Score:   Fall Risk Score:  `1  Depression screen [facility] 2/9     05/02/2022   10:44 AM 02/07/2022    2:22 PM 11/02/2020   10:10 AM 08/03/2020    9:40 AM 02/02/2020    8:42 AM 01/13/2020   11:55 AM 11/26/2019    9:26 AM  Depression screen PHQ 2/9  Decreased Interest 0 '1 1 1 1 3 '$ 0  Down, Depressed, Hopeless 0 1 0 0 0 1 0  PHQ - 2 Score 0 '2 1 1 1 4 '$ 0  Altered sleeping 1 0 '2 1 1 '$ 0 0  Tired, decreased energy '1 1 2 '$ 0 '1 3 1  '$ Change in appetite 0 0 1 1 0 2 1  Feeling bad or failure about yourself  1 2 0 0 1 3 0  Trouble concentrating 0 '1 1 2 2 3 3  '$ Moving slowly or fidgety/restless 0 3 0 0 0 0 0  Suicidal thoughts 0 0 0 0 0 1 0  PHQ-9 Score '3 9 7 5 6 16 5  '$ Difficult doing work/chores Somewhat difficult Not difficult at all Not difficult at all Somewhat difficult  Very difficult Somewhat difficult      Review of Systems  All other systems reviewed and are negative.     Objective:   Physical Exam  Motor strength is 5/5 bilateral hip flexor knee extensor ankle dorsiflexor and plantar flexor Tone MAS 0 at the knee extensors knee flexors ankle dorsiflexors and plantar flexors inverters and everters. Ambulates without assistive device he has a decreased base of support but no overt scissoring.  His balance is good.  He has no evidence of toe drag or knee instability. Sensation is normal to lower limbs Deep tendon reflexes are 2+ bilateral knees and ankles.       Assessment & Plan:  1.  Lower extremity spasms with history of thoracic myelopathy.  He has done very well postoperatively and with physical therapy.  He is independent with all self-care and mobility.  His spasms are not of a consistent pattern.  Worse when he is in bed.  Could not see any signs of significant spasticity on examination  today.  His narrow-based gait is not accompanied by signs of adductor spasticity on examination. We discussed that given the intermittent nature of his spasticity and mainly having it when supine in bed, would not recommend botulinum toxin injections.  Instead would recommend a trial of baclofen 10 mg 3 times daily in place of tizanidine. Recheck in 4 weeks and if no better consider going up on the baclofen dosing as long as he is tolerating the medications. Will continue to monitor for signs of worsening lower extremity tone.

## 2022-05-03 ENCOUNTER — Ambulatory Visit (HOSPITAL_BASED_OUTPATIENT_CLINIC_OR_DEPARTMENT_OTHER): Payer: BC Managed Care – PPO | Attending: Neurological Surgery | Admitting: Physical Therapy

## 2022-05-03 ENCOUNTER — Encounter (HOSPITAL_BASED_OUTPATIENT_CLINIC_OR_DEPARTMENT_OTHER): Payer: Self-pay | Admitting: Physical Therapy

## 2022-05-03 DIAGNOSIS — R293 Abnormal posture: Secondary | ICD-10-CM | POA: Insufficient documentation

## 2022-05-03 DIAGNOSIS — M6281 Muscle weakness (generalized): Secondary | ICD-10-CM | POA: Diagnosis not present

## 2022-05-03 DIAGNOSIS — M546 Pain in thoracic spine: Secondary | ICD-10-CM | POA: Diagnosis not present

## 2022-05-03 DIAGNOSIS — R29898 Other symptoms and signs involving the musculoskeletal system: Secondary | ICD-10-CM

## 2022-05-03 DIAGNOSIS — R531 Weakness: Secondary | ICD-10-CM
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
## 2022-05-04 ENCOUNTER — Ambulatory Visit: Payer: BC Managed Care – PPO | Admitting: Physical Therapy

## 2022-05-04 ENCOUNTER — Other Ambulatory Visit: Payer: Self-pay | Admitting: Physician Assistant

## 2022-05-04 ENCOUNTER — Encounter: Payer: Self-pay | Admitting: Physical Therapy

## 2022-05-04 DIAGNOSIS — R531 Weakness: Secondary | ICD-10-CM | POA: Diagnosis not present

## 2022-05-04 DIAGNOSIS — M546 Pain in thoracic spine: Secondary | ICD-10-CM | POA: Diagnosis not present

## 2022-05-04 DIAGNOSIS — M542 Cervicalgia: Secondary | ICD-10-CM

## 2022-05-04 DIAGNOSIS — R29898 Other symptoms and signs involving the musculoskeletal system: Secondary | ICD-10-CM

## 2022-05-04 DIAGNOSIS — M5416 Radiculopathy, lumbar region: Secondary | ICD-10-CM | POA: Diagnosis not present

## 2022-05-04 DIAGNOSIS — R293 Abnormal posture: Secondary | ICD-10-CM | POA: Diagnosis not present

## 2022-05-04 DIAGNOSIS — E1169 Type 2 diabetes mellitus with other specified complication: Secondary | ICD-10-CM
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
## 2022-05-10 ENCOUNTER — Ambulatory Visit (HOSPITAL_BASED_OUTPATIENT_CLINIC_OR_DEPARTMENT_OTHER): Payer: BC Managed Care – PPO | Admitting: Physical Therapy

## 2022-05-10 ENCOUNTER — Encounter (HOSPITAL_BASED_OUTPATIENT_CLINIC_OR_DEPARTMENT_OTHER): Payer: Self-pay | Admitting: Physical Therapy

## 2022-05-10 DIAGNOSIS — M6281 Muscle weakness (generalized): Secondary | ICD-10-CM | POA: Diagnosis not present

## 2022-05-10 DIAGNOSIS — R293 Abnormal posture: Secondary | ICD-10-CM

## 2022-05-10 DIAGNOSIS — M546 Pain in thoracic spine: Secondary | ICD-10-CM | POA: Diagnosis not present

## 2022-05-10 DIAGNOSIS — R29898 Other symptoms and signs involving the musculoskeletal system: Secondary | ICD-10-CM

## 2022-05-10 DIAGNOSIS — R531 Weakness: Secondary | ICD-10-CM
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
## 2022-05-11 ENCOUNTER — Ambulatory Visit: Payer: BC Managed Care – PPO | Admitting: Physical Therapy

## 2022-05-11 ENCOUNTER — Encounter: Payer: Self-pay | Admitting: Physical Therapy

## 2022-05-11 DIAGNOSIS — R293 Abnormal posture: Secondary | ICD-10-CM | POA: Diagnosis not present

## 2022-05-11 DIAGNOSIS — M5416 Radiculopathy, lumbar region: Secondary | ICD-10-CM

## 2022-05-11 DIAGNOSIS — M546 Pain in thoracic spine: Secondary | ICD-10-CM

## 2022-05-11 DIAGNOSIS — R531 Weakness: Secondary | ICD-10-CM

## 2022-05-11 DIAGNOSIS — M542 Cervicalgia: Secondary | ICD-10-CM

## 2022-05-11 DIAGNOSIS — R29898 Other symptoms and signs involving the musculoskeletal system: Secondary | ICD-10-CM | POA: Diagnosis not present
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
## 2022-05-16 ENCOUNTER — Encounter: Payer: Self-pay | Admitting: Physical Therapy

## 2022-05-16 ENCOUNTER — Encounter: Payer: Self-pay | Admitting: Physician Assistant

## 2022-05-16 ENCOUNTER — Ambulatory Visit (INDEPENDENT_AMBULATORY_CARE_PROVIDER_SITE_OTHER): Payer: BC Managed Care – PPO | Admitting: Physician Assistant

## 2022-05-16 ENCOUNTER — Ambulatory Visit: Payer: BC Managed Care – PPO | Admitting: Physical Therapy

## 2022-05-16 VITALS — BP 117/83 | HR 95 | Ht 71.0 in | Wt 277.0 lb

## 2022-05-16 DIAGNOSIS — R293 Abnormal posture: Secondary | ICD-10-CM | POA: Diagnosis not present

## 2022-05-16 DIAGNOSIS — E1165 Type 2 diabetes mellitus with hyperglycemia: Secondary | ICD-10-CM | POA: Diagnosis not present

## 2022-05-16 DIAGNOSIS — R531 Weakness: Secondary | ICD-10-CM | POA: Diagnosis not present

## 2022-05-16 DIAGNOSIS — R29898 Other symptoms and signs involving the musculoskeletal system: Secondary | ICD-10-CM

## 2022-05-16 DIAGNOSIS — M546 Pain in thoracic spine: Secondary | ICD-10-CM | POA: Diagnosis not present

## 2022-05-16 DIAGNOSIS — R252 Cramp and spasm: Secondary | ICD-10-CM

## 2022-05-16 DIAGNOSIS — M542 Cervicalgia: Secondary | ICD-10-CM | POA: Diagnosis not present

## 2022-05-16 DIAGNOSIS — I10 Essential (primary) hypertension: Secondary | ICD-10-CM

## 2022-05-16 DIAGNOSIS — E785 Hyperlipidemia, unspecified: Secondary | ICD-10-CM | POA: Diagnosis not present

## 2022-05-16 DIAGNOSIS — M5416 Radiculopathy, lumbar region: Secondary | ICD-10-CM | POA: Diagnosis not present

## 2022-05-16 LAB — POCT GLYCOSYLATED HEMOGLOBIN (HGB A1C): Hemoglobin A1C: 8.7 % — AB (ref 4.0–5.6)

## 2022-05-16 MED ORDER — TRULICITY 0.75 MG/0.5ML ~~LOC~~ SOAJ: 0.7500 mg | SUBCUTANEOUS | 2 refills | Status: DC
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
## 2022-05-16 NOTE — Patient Instructions (Signed)
Start trulicity weekly Continue on metformin Stop crestor for 2 weeks and see if it makes any difference with cramping

## 2022-05-16 NOTE — Progress Notes (Signed)
Established Patient Office Visit  Subjective   Patient ID: Zo Dinovo, male    DOB: [birth year]  Age: 37 y.o. MRN: [id number]  Chief Complaint  Patient presents with   Follow-up   Diabetes    HPI Pt is a 37 yo obese male with T2DM, MDD, GAD, HLD who presents to the clinic for medication refills.   He has had a lot going on in last 3 months. He had emergency thoracic laminectomy and foraminotomy with Dr. Ronnald Ramp. He was losing his ability to walk and in so much pain. Surgery was successful and patient now in PT.   Pt is not checking sugars. He is taking metformin but not taking mounjaro due to cost. Denies any hypoglyecmic events.   Pt is having bilateral leg cramps but left worse than right. He is taking magnesium, drinking water and taking muscle relaxers. He wonders if it could be his statin.   His mood is doing very well.   .. Active Ambulatory Problems    Diagnosis Date Noted   Tendinitis of left hand 01/16/2014   Lumbar spinal stenosis 05/20/2019   Class 3 severe obesity due to excess calories with serious comorbidity and body mass index (BMI) of 40.0 to 44.9 in adult ([place]) 08/12/2019   Diabetes mellitus type II, controlled ([place]) 08/12/2019   Spinal stenosis 08/27/2019   Injury of left ring finger 09/22/2019   Left fourth distal phalangeal exostosis 09/22/2019   Dyslipidemia 10/28/2019   Anxiety 12/01/2019   Elevated blood pressure reading without diagnosis of hypertension 12/26/2019   Acute nonintractable headache 12/26/2019   Dizziness 12/26/2019   Depressed mood 01/14/2020   Irritable 01/14/2020   Non-restorative sleep 01/14/2020   Snoring 01/14/2020   Poor concentration 01/14/2020   Change in consistency of stool 05/03/2020   Skin tag 05/03/2020   ADHD (attention deficit hyperactivity disorder), combined type 06/23/2020   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 ([place]) 06/23/2020   GAD (generalized anxiety disorder) 06/23/2020   Sinus headache  06/22/2021   Allergic sinusitis 06/22/2021   Seasonal allergic rhinitis due to pollen 06/22/2021   Acute right-sided low back pain with right-sided sciatica 07/14/2021   Neck pain 07/14/2021   Fracture of rib of right side 10/04/2021   Orthostatic hypotension 10/04/2021   Hypertension goal BP (blood pressure) < 130/80 02/10/2022   Thoracic spondylosis with myelopathy 03/10/2022   Bilateral leg cramps 05/16/2022   Resolved Ambulatory Problems    Diagnosis Date Noted   Closed fracture of proximal phalanx of third finger of left hand 11/28/2013   Past Medical History:  Diagnosis Date   ADHD (attention deficit hyperactivity disorder)    Arthritis    COVID    Depression    Diabetes mellitus without complication ([place])    Family history of adverse reaction to anesthesia    Fatty liver    Headache    Hypertension    No pertinent past medical history      ROS See HPI.    Objective:     BP 117/83   Pulse 95   Ht 5\' 11"  (1.803 m)   Wt 277 lb (125.6 kg)   SpO2 100%   BMI 38.63 kg/m  BP Readings from Last 3 Encounters:  05/16/22 117/83  03/16/22 118/67  02/07/22 (!) 154/88   Wt Readings from Last 3 Encounters:  05/16/22 277 lb (125.6 kg)  05/02/22 275 lb (124.7 kg)  03/10/22 282 lb (127.9 kg)      Physical Exam Constitutional:  Appearance: Normal appearance. He is obese.  Cardiovascular:     Rate and Rhythm: Normal rate.  Pulmonary:     Effort: Pulmonary effort is normal.  Musculoskeletal:     Right lower leg: No edema.     Left lower leg: No edema.     Comments: Lower leg strength 5/5.  No calf tenderness.   Neurological:     General: No focal deficit present.     Mental Status: He is alert and oriented to person, place, and time.  Psychiatric:        Mood and Affect: Mood normal.      Results for orders placed or performed in visit on 05/16/22  POCT glycosylated hemoglobin (Hb A1C)  Result Value Ref Range   Hemoglobin A1C 8.7 (A) 4.0 - 5.6 %    HbA1c POC (<> result, manual entry)     HbA1c, POC (prediabetic range)     HbA1c, POC (controlled diabetic range)         Assessment & Plan:  Marland KitchenMarland KitchenCiaran was seen today for follow-up and diabetes.  Diagnoses and all orders for this visit:  Uncontrolled type 2 diabetes mellitus with hyperglycemia ([place]) -     POCT glycosylated hemoglobin (Hb A1C) -     Dulaglutide (TRULICITY) [id number] SOPN; Inject 0.75 mg into the skin once a week.  Bilateral leg cramps  Dyslipidemia  Hypertension goal BP (blood pressure) < 130/80    A1c is not to goal but is trending down Not able to afford mounjaro Sent trulicity to start weekly  Discussed DM diet BP to goal, continue lisinopril Stop statin to see if contributing to bilateral muscle cramps for next 2 weeks then let me know if we need to look for different option Needs eye exam Foot exam UTD Declined flu/covid/tdap and pneumonia vaccines Follow up in 3 months.   Discussed cramps See statin discussion Make sure drinking enough water Continue on magnesium Continue on baclofen as needed Make sure doing daily exercises and stretches    Return in about 3 months (around 08/16/2022).    Iran Planas, PA-C

## 2022-05-16 NOTE — Addendum Note (Signed)
Addended by: Teddy Spike on: 05/16/2022 05:00 PM   Modules accepted: Orders

## 2022-05-17 ENCOUNTER — Ambulatory Visit (HOSPITAL_BASED_OUTPATIENT_CLINIC_OR_DEPARTMENT_OTHER): Payer: BC Managed Care – PPO | Admitting: Physical Therapy

## 2022-05-17 LAB — MICROALBUMIN / CREATININE URINE RATIO
Creatinine, Urine: 113 mg/dL (ref 20–320)
Microalb Creat Ratio: 8 mg/g creat (ref <30)
Microalb, Ur: 0.9 mg/dL

## 2022-05-17 NOTE — Progress Notes (Signed)
Normal microalbumin

## 2022-05-18 ENCOUNTER — Other Ambulatory Visit: Payer: Self-pay | Admitting: Physical Medicine & Rehabilitation

## 2022-05-25 ENCOUNTER — Encounter: Payer: Self-pay | Admitting: Physical Therapy

## 2022-05-25 ENCOUNTER — Ambulatory Visit: Payer: BC Managed Care – PPO | Admitting: Physical Therapy

## 2022-05-25 DIAGNOSIS — M542 Cervicalgia: Secondary | ICD-10-CM | POA: Diagnosis not present

## 2022-05-25 DIAGNOSIS — R29898 Other symptoms and signs involving the musculoskeletal system: Secondary | ICD-10-CM | POA: Diagnosis not present

## 2022-05-25 DIAGNOSIS — R293 Abnormal posture: Secondary | ICD-10-CM | POA: Diagnosis not present

## 2022-05-25 DIAGNOSIS — M5416 Radiculopathy, lumbar region: Secondary | ICD-10-CM | POA: Diagnosis not present

## 2022-05-25 DIAGNOSIS — R531 Weakness: Secondary | ICD-10-CM | POA: Diagnosis not present

## 2022-05-25 DIAGNOSIS — M546 Pain in thoracic spine: Secondary | ICD-10-CM | POA: Diagnosis not present
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
## 2022-05-30 ENCOUNTER — Encounter: Payer: Self-pay | Admitting: Physical Medicine & Rehabilitation

## 2022-05-30 ENCOUNTER — Ambulatory Visit
Payer: BC Managed Care – PPO | Attending: Neurological Surgery | Admitting: Rehabilitative and Restorative Service Providers"

## 2022-05-30 ENCOUNTER — Encounter: Payer: Self-pay | Admitting: Rehabilitative and Restorative Service Providers"

## 2022-05-30 ENCOUNTER — Encounter
Payer: BC Managed Care – PPO | Attending: Physical Medicine & Rehabilitation | Admitting: Physical Medicine & Rehabilitation

## 2022-05-30 VITALS — BP 134/85 | HR 108 | Ht 71.0 in | Wt 279.0 lb

## 2022-05-30 DIAGNOSIS — R531 Weakness: Secondary | ICD-10-CM | POA: Diagnosis not present

## 2022-05-30 DIAGNOSIS — M4714 Other spondylosis with myelopathy, thoracic region: Secondary | ICD-10-CM | POA: Diagnosis not present

## 2022-05-30 DIAGNOSIS — M5416 Radiculopathy, lumbar region: Secondary | ICD-10-CM | POA: Diagnosis not present

## 2022-05-30 DIAGNOSIS — M542 Cervicalgia: Secondary | ICD-10-CM | POA: Insufficient documentation

## 2022-05-30 DIAGNOSIS — R293 Abnormal posture: Secondary | ICD-10-CM | POA: Insufficient documentation

## 2022-05-30 DIAGNOSIS — R29898 Other symptoms and signs involving the musculoskeletal system: Secondary | ICD-10-CM | POA: Diagnosis not present

## 2022-05-30 DIAGNOSIS — M546 Pain in thoracic spine: Secondary | ICD-10-CM | POA: Insufficient documentation
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
## 2022-05-30 NOTE — Patient Instructions (Signed)
Please do home exercises on a regular basis as recommended by PT

## 2022-05-30 NOTE — Progress Notes (Signed)
Subjective:    Patient ID: Kenneth Mcbride, male    DOB: 11-18-1985, 37 y.o.   MRN: [id number]  HPI  37 year old male with thoracic myelopathy status post decompressive surgery x 2 who returns today for spasticity management.  The patient states he still has intermittent spasms mostly in the foot and ankle area but these are fairly mild.  He was started on baclofen 10 mg 3 times daily in place of the tizanidine.  He only had a 10-day supply of the baclofen and has not been taking it for more than 2 weeks.  In addition he did not restart his tizanidine.  Despite being off medications he feels like his spasms have not worsened.  No longer bowling but hope to resume in August Still going to PT another 2 visits Came down on knee but no actual falls  Pain Inventory Average Pain 1 Pain Right Now 1 My pain is  soreness  In the last 24 hours, has pain interfered with the following? General activity 2 Relation with others 1 Enjoyment of life 2 What TIME of day is your pain at its worst? night Sleep (in general) Fair  Pain is worse with: walking Pain improves with: rest Relief from Meds:  otc  Family History  Problem Relation Age of Onset   Diabetes Father    Heart disease Father    Diabetes Paternal Grandfather    COPD Paternal Grandfather    Social History   Socioeconomic History   Marital status: Single    Spouse name: Not on file   Number of children: Not on file   Years of education: Not on file   Highest education level: Not on file  Occupational History   Not on file  Tobacco Use   Smoking status: Never   Smokeless tobacco: Never  Vaping Use   Vaping Use: Never used  Substance and Sexual Activity   Alcohol use: Yes    Comment: ocassionally   Drug use: Never   Sexual activity: Not Currently    Partners: Female  Other Topics Concern   Not on file  Social History Narrative   Not on file   Social Determinants of Health   Financial Resource Strain: Not on file   Food Insecurity: Not on file  Transportation Needs: Not on file  Physical Activity: Not on file  Stress: Not on file  Social Connections: Not on file   Past Surgical History:  Procedure Laterality Date   LUMBAR LAMINECTOMY/DECOMPRESSION MICRODISCECTOMY N/A 08/27/2019   Procedure: LUMBAR 2-LUMBAR 5 DECOMPRESSION;  Surgeon: Phylliss Bob, MD;  Location: [place];  Service: Orthopedics;  Laterality: N/A;   LUMBAR LAMINECTOMY/DECOMPRESSION MICRODISCECTOMY N/A 03/10/2022   Procedure: Laminectomy and Foraminotomy - Thoracic Seven - Thoracic Twelve;  Surgeon: Eustace Moore, MD;  Location: [facility] [place];  Service: Neurosurgery;  Laterality: N/A;   Past Surgical History:  Procedure Laterality Date   LUMBAR LAMINECTOMY/DECOMPRESSION MICRODISCECTOMY N/A 08/27/2019   Procedure: LUMBAR 2-LUMBAR 5 DECOMPRESSION;  Surgeon: Phylliss Bob, MD;  Location: [place];  Service: Orthopedics;  Laterality: N/A;   LUMBAR LAMINECTOMY/DECOMPRESSION MICRODISCECTOMY N/A 03/10/2022   Procedure: Laminectomy and Foraminotomy - Thoracic Seven - Thoracic Twelve;  Surgeon: Eustace Moore, MD;  Location: [place];  Service: Neurosurgery;  Laterality: N/A;   Past Medical History:  Diagnosis Date   ADHD (attention deficit hyperactivity disorder)    ADHD   Anxiety    Arthritis    COVID    October 2021, February 2022 - 1st one  was severe per pt. and 2nd one was mild   Depression    Diabetes mellitus without complication (HCC)    Family history of adverse reaction to anesthesia    father and grand father both heart stopped during surgery   Fatty liver    Headache    Hypertension    No pertinent past medical history    Spinal stenosis    Ht 5\' 11"  (1.803 m)   Wt 279 lb (126.6 kg)   BMI 38.91 kg/m   Opioid Risk Score:   Fall Risk Score:  `1  Depression screen [facility] 2/9     05/30/2022   10:06 AM 05/16/2022    9:55 AM 05/02/2022   10:44 AM 02/07/2022    2:22 PM 11/02/2020   10:10 AM 08/03/2020    9:40 AM 02/02/2020    8:42 AM   Depression screen PHQ 2/9  Decreased Interest 0 0 0 1 1 1 1   Down, Depressed, Hopeless 0 0 0 1 0 0 0  PHQ - 2 Score 0 0 0 2 1 1 1   Altered sleeping   1 0 2 1 1   Tired, decreased energy   1 1 2  0 1  Change in appetite   0 0 1 1 0  Feeling bad or failure about yourself    1 2 0 0 1  Trouble concentrating   0 1 1 2 2   Moving slowly or fidgety/restless   0 3 0 0 0  Suicidal thoughts   0 0 0 0 0  PHQ-9 Score   3 9 7 5 6   Difficult doing work/chores  Not difficult at all Somewhat difficult Not difficult at all Not difficult at all Somewhat difficult      Review of Systems  Musculoskeletal:  Positive for back pain.  All other systems reviewed and are negative.      Objective:   Physical Exam Motor strength is 4/5 bilateral hip flexors 5/5 left knee extensors, 4/5 right knee extensors 3/5 bilateral ankle plantar flexors 5/5 bilateral ankle dorsiflexors Tone no evidence of clonus bilateral ankles DTRs bilateral patella 2+ No evidence of spasticity noted with passive movements of the hips knees or ankles. Ambulates without assistive device he is able to heel walk but unable to toe walk.  No loss of balance no lead knee buckling no foot drop.       Assessment & Plan:   1.  Lower extremity spasms with history of thoracic myelopathy.  He has done very well postoperatively and with physical therapy.  He is independent with all self-care and mobility.  His spasms are not of a consistent pattern.  Worse when he is in bed.  Could not see any signs of significant spasticity on examination today.  We discussed that given the intermittent nature of his spasticity and mainly having it when supine in bed, would not recommend botulinum toxin injections or any oral medications for spasticity.  He is to finish out his outpatient PT and more importantly continue with his home exercise program.  It is possible that his spasticity can increase at a later time at which point we can reevaluate.  I will see him  back on a as needed basis.

## 2022-06-01 ENCOUNTER — Ambulatory Visit: Payer: BC Managed Care – PPO | Admitting: Physical Therapy

## 2022-06-01 ENCOUNTER — Encounter: Payer: Self-pay | Admitting: Physical Therapy

## 2022-06-01 DIAGNOSIS — R293 Abnormal posture: Secondary | ICD-10-CM

## 2022-06-01 DIAGNOSIS — M5416 Radiculopathy, lumbar region: Secondary | ICD-10-CM

## 2022-06-01 DIAGNOSIS — R531 Weakness: Secondary | ICD-10-CM

## 2022-06-01 DIAGNOSIS — M546 Pain in thoracic spine: Secondary | ICD-10-CM | POA: Diagnosis not present

## 2022-06-01 DIAGNOSIS — M542 Cervicalgia: Secondary | ICD-10-CM

## 2022-06-01 DIAGNOSIS — R29898 Other symptoms and signs involving the musculoskeletal system: Secondary | ICD-10-CM
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
## 2022-06-08 ENCOUNTER — Encounter: Payer: Self-pay | Admitting: Physical Therapy

## 2022-06-08 ENCOUNTER — Ambulatory Visit: Payer: BC Managed Care – PPO | Admitting: Physical Therapy

## 2022-06-08 DIAGNOSIS — M546 Pain in thoracic spine: Secondary | ICD-10-CM | POA: Diagnosis not present

## 2022-06-08 DIAGNOSIS — M542 Cervicalgia: Secondary | ICD-10-CM | POA: Diagnosis not present

## 2022-06-08 DIAGNOSIS — R293 Abnormal posture: Secondary | ICD-10-CM | POA: Diagnosis not present

## 2022-06-08 DIAGNOSIS — M5416 Radiculopathy, lumbar region: Secondary | ICD-10-CM | POA: Diagnosis not present

## 2022-06-08 DIAGNOSIS — R29898 Other symptoms and signs involving the musculoskeletal system: Secondary | ICD-10-CM | POA: Diagnosis not present

## 2022-06-08 DIAGNOSIS — R531 Weakness: Secondary | ICD-10-CM
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
## 2022-06-15 ENCOUNTER — Ambulatory Visit: Payer: BC Managed Care – PPO | Admitting: Rehabilitative and Restorative Service Providers"

## 2022-06-19 ENCOUNTER — Telehealth (INDEPENDENT_AMBULATORY_CARE_PROVIDER_SITE_OTHER): Payer: BC Managed Care – PPO | Admitting: Physician Assistant

## 2022-06-19 ENCOUNTER — Encounter: Payer: Self-pay | Admitting: Physician Assistant

## 2022-06-19 VITALS — BP 142/91 | HR 93 | Temp 98.2°F | Ht 71.0 in | Wt 278.0 lb

## 2022-06-19 DIAGNOSIS — J329 Chronic sinusitis, unspecified: Secondary | ICD-10-CM

## 2022-06-19 DIAGNOSIS — R051 Acute cough: Secondary | ICD-10-CM

## 2022-06-19 DIAGNOSIS — J40 Bronchitis, not specified as acute or chronic: Secondary | ICD-10-CM

## 2022-06-19 MED ORDER — ALBUTEROL SULFATE HFA 108 (90 BASE) MCG/ACT IN AERS: 2.0000 | INHALATION_SPRAY | Freq: Four times a day (QID) | RESPIRATORY_TRACT | 0 refills | Status: AC | PRN

## 2022-06-19 MED ORDER — PREDNISONE 50 MG PO TABS: ORAL_TABLET | ORAL | 0 refills | Status: DC

## 2022-06-19 MED ORDER — AZITHROMYCIN 250 MG PO TABS: ORAL_TABLET | ORAL | 0 refills | Status: DC

## 2022-06-19 NOTE — Progress Notes (Signed)
..Virtual Visit via Video Note  I connected with Kenneth Mcbride on 06/19/22 at 10:10 AM EDT by a video enabled telemedicine application and verified that I am speaking with the correct person using two identifiers.  Location: Patient: home Provider: clinic  .Marland KitchenParticipating in visit:  Patient: Kenneth Mcbride Provider:Analisse Randle PA-C   I discussed the limitations of evaluation and management by telemedicine and the availability of in person appointments. The patient expressed understanding and agreed to proceed.  History of Present Illness: Pt is a 37 yo male who calls into the clinic with productive cough, sinus pressure, ear pressure, headache, SOB for last 4 days. Pt has been taking ibuprofen, imitrex and albuterol. No real relief. Pt has hx of bronchitis this time of year. He had a fever last night with chills. His wife is also sick with similar symptoms before him.   .. Active Ambulatory Problems    Diagnosis Date Noted   Tendinitis of left hand 01/16/2014   Lumbar spinal stenosis 05/20/2019   Class 3 severe obesity due to excess calories with serious comorbidity and body mass index (BMI) of 40.0 to 44.9 in adult 08/12/2019   Diabetes mellitus type II, controlled 08/12/2019   Spinal stenosis 08/27/2019   Injury of left ring finger 09/22/2019   Left fourth distal phalangeal exostosis 09/22/2019   Dyslipidemia 10/28/2019   Anxiety 12/01/2019   Elevated blood pressure reading without diagnosis of hypertension 12/26/2019   Acute nonintractable headache 12/26/2019   Dizziness 12/26/2019   Depressed mood 01/14/2020   Irritable 01/14/2020   Non-restorative sleep 01/14/2020   Snoring 01/14/2020   Poor concentration 01/14/2020   Change in consistency of stool 05/03/2020   Skin tag 05/03/2020   ADHD (attention deficit hyperactivity disorder), combined type 06/23/2020   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 06/23/2020   GAD (generalized anxiety disorder) 06/23/2020   Sinus  headache 06/22/2021   Allergic sinusitis 06/22/2021   Seasonal allergic rhinitis due to pollen 06/22/2021   Acute right-sided low back pain with right-sided sciatica 07/14/2021   Neck pain 07/14/2021   Fracture of rib of right side 10/04/2021   Orthostatic hypotension 10/04/2021   Hypertension goal BP (blood pressure) < 130/80 02/10/2022   Thoracic spondylosis with myelopathy 03/10/2022   Bilateral leg cramps 05/16/2022   Resolved Ambulatory Problems    Diagnosis Date Noted   Closed fracture of proximal phalanx of third finger of left hand 11/28/2013   Past Medical History:  Diagnosis Date   ADHD (attention deficit hyperactivity disorder)    Arthritis    COVID    Depression    Diabetes mellitus without complication    Family history of adverse reaction to anesthesia    Fatty liver    Headache    Hypertension    No pertinent past medical history        Observations/Objective: No acute distress Normal breathing Productive cough Normal mood and appearance  .[name] Today's Vitals   06/19/22 1009  BP: (!) 142/91  Pulse: 93  Temp: 98.2 F (36.8 C)  Weight: 278 lb (126.1 kg)  Height:  (1.803 m)   Body mass index is 38.77 kg/m.     Assessment and Plan: Marland KitchenMarland KitchenDiagnoses and all orders for this visit:  Sinobronchitis -     predniSONE (DELTASONE) 50 MG tablet; Take one tablet for 5 days. -     albuterol (VENTOLIN HFA) 108 (90 Base) MCG/ACT inhaler; Inhale 2 puffs into the lungs every 6 (six) hours as needed. -  azithromycin (ZITHROMAX Z-PAK) 250 MG tablet; Take 2 tablets (500 mg) on  Day 1,  followed by 1 tablet (250 mg) once daily on Days 2 through 5.  Acute cough -     albuterol (VENTOLIN HFA) 108 (90 Base) MCG/ACT inhaler; Inhale 2 puffs into the lungs every 6 (six) hours as needed.   Start prednisone and albuterol  Ok to continue mucinex and delsym Hold zpak for another 24-48 hours start if symptoms continue  Rest and hydrate Follow up as needed or if  symptoms persist.    Follow Up Instructions:    I discussed the assessment and treatment plan with the patient. The patient was provided an opportunity to ask questions and all were answered. The patient agreed with the plan and demonstrated an understanding of the instructions.   The patient was advised to call back or seek an in-person evaluation if the symptoms worsen or if the condition fails to improve as anticipated.   Tandy Gaw, PA-C

## 2022-06-19 NOTE — Progress Notes (Signed)
Meds: Ibuprofen  Current Sx: headache, lightheaded, yellow sputum  Started: Saturday

## 2022-06-22 ENCOUNTER — Encounter: Payer: Self-pay | Admitting: Physical Therapy

## 2022-06-22 ENCOUNTER — Ambulatory Visit: Payer: BC Managed Care – PPO | Admitting: Physical Therapy

## 2022-06-22 DIAGNOSIS — M542 Cervicalgia: Secondary | ICD-10-CM | POA: Diagnosis not present

## 2022-06-22 DIAGNOSIS — R531 Weakness: Secondary | ICD-10-CM | POA: Diagnosis not present

## 2022-06-22 DIAGNOSIS — R29898 Other symptoms and signs involving the musculoskeletal system: Secondary | ICD-10-CM

## 2022-06-22 DIAGNOSIS — R293 Abnormal posture: Secondary | ICD-10-CM

## 2022-06-22 DIAGNOSIS — M5416 Radiculopathy, lumbar region: Secondary | ICD-10-CM

## 2022-06-22 DIAGNOSIS — M546 Pain in thoracic spine: Secondary | ICD-10-CM | POA: Diagnosis not present
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
## 2022-06-27 DIAGNOSIS — G959 Disease of spinal cord, unspecified: Secondary | ICD-10-CM | POA: Diagnosis not present

## 2022-06-28 ENCOUNTER — Ambulatory Visit: Payer: BC Managed Care – PPO | Attending: Neurological Surgery | Admitting: Physical Therapy

## 2022-06-28 ENCOUNTER — Encounter: Payer: Self-pay | Admitting: Physical Therapy

## 2022-06-28 DIAGNOSIS — R293 Abnormal posture: Secondary | ICD-10-CM | POA: Diagnosis not present

## 2022-06-28 DIAGNOSIS — M5416 Radiculopathy, lumbar region: Secondary | ICD-10-CM

## 2022-06-28 DIAGNOSIS — M542 Cervicalgia: Secondary | ICD-10-CM | POA: Diagnosis not present

## 2022-06-28 DIAGNOSIS — M546 Pain in thoracic spine: Secondary | ICD-10-CM | POA: Insufficient documentation

## 2022-06-28 DIAGNOSIS — R531 Weakness: Secondary | ICD-10-CM

## 2022-06-28 DIAGNOSIS — R29898 Other symptoms and signs involving the musculoskeletal system: Secondary | ICD-10-CM | POA: Diagnosis not present
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
## 2022-06-30 ENCOUNTER — Encounter: Payer: Self-pay | Admitting: Physician Assistant

## 2022-06-30 MED ORDER — PREDNISONE 20 MG PO TABS: ORAL_TABLET | ORAL | 0 refills | Status: DC

## 2022-06-30 NOTE — Addendum Note (Signed)
Addended by: Jomarie Longs on: 06/30/2022 04:17 PM   Modules accepted: Orders

## 2022-07-01 ENCOUNTER — Other Ambulatory Visit: Payer: Self-pay | Admitting: Physician Assistant

## 2022-07-01 DIAGNOSIS — F330 Major depressive disorder, recurrent, mild: Secondary | ICD-10-CM

## 2022-07-01 DIAGNOSIS — F411 Generalized anxiety disorder: Secondary | ICD-10-CM

## 2022-07-05 ENCOUNTER — Ambulatory Visit: Payer: BC Managed Care – PPO | Admitting: Physical Therapy

## 2022-07-05 DIAGNOSIS — R293 Abnormal posture: Secondary | ICD-10-CM

## 2022-07-05 DIAGNOSIS — R531 Weakness: Secondary | ICD-10-CM | POA: Diagnosis not present

## 2022-07-05 DIAGNOSIS — M5416 Radiculopathy, lumbar region: Secondary | ICD-10-CM | POA: Diagnosis not present

## 2022-07-05 DIAGNOSIS — R29898 Other symptoms and signs involving the musculoskeletal system: Secondary | ICD-10-CM | POA: Diagnosis not present

## 2022-07-05 DIAGNOSIS — M542 Cervicalgia: Secondary | ICD-10-CM | POA: Diagnosis not present

## 2022-07-05 DIAGNOSIS — M546 Pain in thoracic spine: Secondary | ICD-10-CM | POA: Diagnosis not present
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
## 2022-07-11 ENCOUNTER — Ambulatory Visit: Payer: BC Managed Care – PPO | Admitting: Physical Therapy

## 2022-07-11 ENCOUNTER — Encounter: Payer: Self-pay | Admitting: Physical Therapy

## 2022-07-11 DIAGNOSIS — R531 Weakness: Secondary | ICD-10-CM | POA: Diagnosis not present

## 2022-07-11 DIAGNOSIS — M5416 Radiculopathy, lumbar region: Secondary | ICD-10-CM

## 2022-07-11 DIAGNOSIS — R29898 Other symptoms and signs involving the musculoskeletal system: Secondary | ICD-10-CM | POA: Diagnosis not present

## 2022-07-11 DIAGNOSIS — R293 Abnormal posture: Secondary | ICD-10-CM

## 2022-07-11 DIAGNOSIS — M542 Cervicalgia: Secondary | ICD-10-CM | POA: Diagnosis not present

## 2022-07-11 DIAGNOSIS — M546 Pain in thoracic spine: Secondary | ICD-10-CM
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
## 2022-07-18 ENCOUNTER — Telehealth (INDEPENDENT_AMBULATORY_CARE_PROVIDER_SITE_OTHER): Payer: BC Managed Care – PPO | Admitting: Physician Assistant

## 2022-07-18 ENCOUNTER — Encounter: Payer: Self-pay | Admitting: Physician Assistant

## 2022-07-18 VITALS — Ht 72.0 in | Wt 275.0 lb

## 2022-07-18 DIAGNOSIS — J209 Acute bronchitis, unspecified: Secondary | ICD-10-CM | POA: Diagnosis not present

## 2022-07-18 MED ORDER — HYDROCOD POLI-CHLORPHE POLI ER 10-8 MG/5ML PO SUER: 5.0000 mL | Freq: Two times a day (BID) | ORAL | 0 refills | Status: DC | PRN

## 2022-07-18 MED ORDER — DEXAMETHASONE 4 MG PO TABS: 4.0000 mg | ORAL_TABLET | Freq: Two times a day (BID) | ORAL | 0 refills | Status: DC

## 2022-07-18 NOTE — Progress Notes (Signed)
..Virtual Visit via Video Note  I connected with Kenneth Mcbride on 07/18/22 at  1:00 PM EDT by a video enabled telemedicine application and verified that I am speaking with the correct person using two identifiers.  Location: Patient: home Provider: clinic  .Marland KitchenParticipating in visit:  Patient: Kenneth Mcbride Provider:Avigayil Ton PA-C   I discussed the limitations of evaluation and management by telemedicine and the availability of in person appointments. The patient expressed understanding and agreed to proceed.  History of Present Illness: Pt is a 37 yo male who calls into the clinic with another dry cough that started 2 days ago. He just finished a round of prednisone and antibiotic. He was feeling better and then his wife got sick with similar symptoms and then his cough started. He does have chronic allergic rhinitis and seasonal allergies but no known asthma. He takes claritin and flonase daily. He does have some chest tightness. He has rescue inhaler and using that which does help. No known fever but reports chills and fatigue. No body aches. Cough is so intense he starts sweating and feels like he is going to vomit.   .. Active Ambulatory Problems    Diagnosis Date Noted   Tendinitis of left hand 01/16/2014   Lumbar spinal stenosis 05/20/2019   Class 3 severe obesity due to excess calories with serious comorbidity and body mass index (BMI) of 40.0 to 44.9 in adult (HCC) 08/12/2019   Diabetes mellitus type II, controlled (HCC) 08/12/2019   Spinal stenosis 08/27/2019   Injury of left ring finger 09/22/2019   Left fourth distal phalangeal exostosis 09/22/2019   Dyslipidemia 10/28/2019   Anxiety 12/01/2019   Elevated blood pressure reading without diagnosis of hypertension 12/26/2019   Acute nonintractable headache 12/26/2019   Dizziness 12/26/2019   Depressed mood 01/14/2020   Irritable 01/14/2020   Non-restorative sleep 01/14/2020   Snoring 01/14/2020   Poor concentration 01/14/2020    Change in consistency of stool 05/03/2020   Skin tag 05/03/2020   ADHD (attention deficit hyperactivity disorder), combined type 06/23/2020   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 (HCC) 06/23/2020   GAD (generalized anxiety disorder) 06/23/2020   Sinus headache 06/22/2021   Allergic sinusitis 06/22/2021   Seasonal allergic rhinitis due to pollen 06/22/2021   Acute right-sided low back pain with right-sided sciatica 07/14/2021   Neck pain 07/14/2021   Fracture of rib of right side 10/04/2021   Orthostatic hypotension 10/04/2021   Hypertension goal BP (blood pressure) < 130/80 02/10/2022   Thoracic spondylosis with myelopathy 03/10/2022   Bilateral leg cramps 05/16/2022   Resolved Ambulatory Problems    Diagnosis Date Noted   Closed fracture of proximal phalanx of third finger of left hand 11/28/2013   Past Medical History:  Diagnosis Date   ADHD (attention deficit hyperactivity disorder)    Arthritis    COVID    Depression    Diabetes mellitus without complication (HCC)    Family history of adverse reaction to anesthesia    Fatty liver    Headache    Hypertension    No pertinent past medical history       Observations/Objective: Dry cough on exam Normal breathing    Assessment and Plan: Marland KitchenMarland KitchenAtthew was seen today for cough.  Diagnoses and all orders for this visit:  Acute bronchitis, unspecified organism -     chlorpheniramine-HYDROcodone (TUSSIONEX) 10-8 MG/5ML; Take 5 mLs by mouth every 12 (twelve) hours as needed for cough (cough, will cause drowsiness.). -  dexamethasone (DECADRON) 4 MG tablet; Take 1 tablet (4 mg total) by mouth 2 (two) times daily with a meal.   Start decadron Continue claritin and flonase Use albuterol as needed every 4-6 hours Rest and hydrate Use OTC cough remedies and tussionex at bedtime  Follow up as needed or if symptoms persist   Follow Up Instructions:    I discussed the assessment and treatment plan with  the patient. The patient was provided an opportunity to ask questions and all were answered. The patient agreed with the plan and demonstrated an understanding of the instructions.   The patient was advised to call back or seek an in-person evaluation if the symptoms worsen or if the condition fails to improve as anticipated.   Tandy Gaw, PA-C

## 2022-07-18 NOTE — Progress Notes (Signed)
Pt states he ran fever  last night  2 months dry cough

## 2022-07-21 MED ORDER — OMEPRAZOLE 40 MG PO CPDR: 40.0000 mg | DELAYED_RELEASE_CAPSULE | Freq: Every day | ORAL | 2 refills | Status: DC

## 2022-07-21 NOTE — Addendum Note (Signed)
Addended by: Jomarie Longs on: 07/21/2022 09:09 AM   Modules accepted: Orders

## 2022-08-04 ENCOUNTER — Other Ambulatory Visit: Payer: Self-pay | Admitting: Physician Assistant

## 2022-08-04 DIAGNOSIS — E1169 Type 2 diabetes mellitus with other specified complication: Secondary | ICD-10-CM

## 2022-08-08 ENCOUNTER — Other Ambulatory Visit: Payer: Self-pay | Admitting: Physician Assistant

## 2022-08-08 DIAGNOSIS — E1165 Type 2 diabetes mellitus with hyperglycemia: Secondary | ICD-10-CM

## 2022-08-16 ENCOUNTER — Ambulatory Visit (INDEPENDENT_AMBULATORY_CARE_PROVIDER_SITE_OTHER): Payer: BC Managed Care – PPO | Admitting: Physician Assistant

## 2022-08-16 VITALS — BP 135/89 | HR 93 | Ht 71.0 in | Wt 277.0 lb

## 2022-08-16 DIAGNOSIS — E1165 Type 2 diabetes mellitus with hyperglycemia: Secondary | ICD-10-CM

## 2022-08-16 DIAGNOSIS — K219 Gastro-esophageal reflux disease without esophagitis: Secondary | ICD-10-CM

## 2022-08-16 DIAGNOSIS — E785 Hyperlipidemia, unspecified: Secondary | ICD-10-CM | POA: Diagnosis not present

## 2022-08-16 DIAGNOSIS — Z7984 Long term (current) use of oral hypoglycemic drugs: Secondary | ICD-10-CM

## 2022-08-16 DIAGNOSIS — I10 Essential (primary) hypertension: Secondary | ICD-10-CM

## 2022-08-16 DIAGNOSIS — E1169 Type 2 diabetes mellitus with other specified complication: Secondary | ICD-10-CM

## 2022-08-16 LAB — POCT GLYCOSYLATED HEMOGLOBIN (HGB A1C): Hemoglobin A1C: 9.5 % — AB (ref 4.0–5.6)

## 2022-08-16 MED ORDER — LISINOPRIL 2.5 MG PO TABS: ORAL_TABLET | ORAL | 3 refills | Status: DC

## 2022-08-16 MED ORDER — FREESTYLE LIBRE 14 DAY SENSOR MISC: 1.0000 | 3 refills | Status: DC

## 2022-08-16 MED ORDER — OMEPRAZOLE 40 MG PO CPDR: 40.0000 mg | DELAYED_RELEASE_CAPSULE | Freq: Every day | ORAL | 3 refills | Status: DC

## 2022-08-16 MED ORDER — TRULICITY 1.5 MG/0.5ML ~~LOC~~ SOAJ: 1.5000 mg | SUBCUTANEOUS | 0 refills | Status: DC

## 2022-08-16 MED ORDER — AMBULATORY NON FORMULARY MEDICATION: 0 refills | Status: DC

## 2022-08-16 NOTE — Progress Notes (Signed)
Established Patient Office Visit  Subjective   Patient ID: Kenneth Mcbride, male    DOB: 05-15-1985  Age: 37 y.o. MRN: [id number]  Chief Complaint  Patient presents with   Follow-up   Diabetes    Pt is a 37 yo obese male with T2DM, MDD, GAD, HLD who presents to the clinic for medication refills.   Pt has been compliant with metformin and trulicity. Pt has not been checking his BG because he has not able to pick up his test strips due to pharmacy issues resulting in a cost of $75/bottle which he cannot afford. Denies hypoglycemic events. [name] diet has been ok. For breakfast he typically has a soda and a carb (bagel/biscuit/cereal), snacks throughout the day, and then has a well balanced meal for dinner that his wife cooks. Patient does admit to worse diet and increased drinking in the last week in celebration of his birthday. Patient does not regularly exercise but he walks at work as tolerated with his recovery from back surgery. He finished PT 41mo ago.  Since last f/u patient has had a respiratory illness and taken 3 courses of steroids. Only a residual dry cough remains. He is no longer experiencing leg cramps (post-surgery) and still takes the statin. Patient is sleeping well and feels stable regarding mood and anxiety.   Additional complaint of indigestion but states it is responsive to medication and he does not believe it to be associated with the trulicity. Denies CP, SOB, vision changes, GI symptoms.    .. Active Ambulatory Problems    Diagnosis Date Noted   Tendinitis of left hand 01/16/2014   Lumbar spinal stenosis 05/20/2019   Class 3 severe obesity due to excess calories with serious comorbidity and body mass index (BMI) of 40.0 to 44.9 in adult (HCC) 08/12/2019   Diabetes mellitus type II, controlled (HCC) 08/12/2019   Spinal stenosis 08/27/2019   Injury of left ring finger 09/22/2019   Left fourth distal phalangeal exostosis 09/22/2019   Dyslipidemia 10/28/2019    Anxiety 12/01/2019   Elevated blood pressure reading without diagnosis of hypertension 12/26/2019   Acute nonintractable headache 12/26/2019   Dizziness 12/26/2019   Depressed mood 01/14/2020   Irritable 01/14/2020   Non-restorative sleep 01/14/2020   Snoring 01/14/2020   Poor concentration 01/14/2020   Change in consistency of stool 05/03/2020   Skin tag 05/03/2020   ADHD (attention deficit hyperactivity disorder), combined type 06/23/2020   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 (HCC) 06/23/2020   GAD (generalized anxiety disorder) 06/23/2020   Sinus headache 06/22/2021   Allergic sinusitis 06/22/2021   Seasonal allergic rhinitis due to pollen 06/22/2021   Acute right-sided low back pain with right-sided sciatica 07/14/2021   Neck pain 07/14/2021   Fracture of rib of right side 10/04/2021   Orthostatic hypotension 10/04/2021   Hypertension goal BP (blood pressure) < 130/80 02/10/2022   Thoracic spondylosis with myelopathy 03/10/2022   Bilateral leg cramps 05/16/2022   Resolved Ambulatory Problems    Diagnosis Date Noted   Closed fracture of proximal phalanx of third finger of left hand 11/28/2013   Past Medical History:  Diagnosis Date   ADHD (attention deficit hyperactivity disorder)    Arthritis    COVID    Depression    Diabetes mellitus without complication (HCC)    Family history of adverse reaction to anesthesia    Fatty liver    Headache    Hypertension    No pertinent past medical history  Review of Systems  Constitutional:  Negative for fever.  Respiratory:  Positive for cough. Negative for sputum production and shortness of breath.   Cardiovascular:  Negative for chest pain and palpitations.  Gastrointestinal:  Negative for constipation, diarrhea, heartburn, nausea and vomiting.  Genitourinary:  Negative for frequency.  Musculoskeletal:  Negative for back pain, falls and myalgias.  Neurological:  Negative for dizziness and headaches.       Objective:     BP 135/89 (BP Location: Left Arm, Patient Position: Sitting, Cuff Size: Large)   Pulse 93   Ht 5\' 11"  (1.803 m)   Wt 277 lb (125.6 kg)   SpO2 98%   BMI 38.63 kg/m  BP Readings from Last 3 Encounters:  08/16/22 135/89  06/19/22 (!) 142/91  05/30/22 134/85   Wt Readings from Last 3 Encounters:  08/16/22 277 lb (125.6 kg)  07/18/22 275 lb (124.7 kg)  06/19/22 278 lb (126.1 kg)      Physical Exam Constitutional:      Appearance: Normal appearance. He is obese.  Cardiovascular:     Rate and Rhythm: Normal rate and regular rhythm.     Heart sounds: Normal heart sounds.  Pulmonary:     Effort: Pulmonary effort is normal.     Breath sounds: Normal breath sounds.  Neurological:     General: No focal deficit present.     Mental Status: He is alert and oriented to person, place, and time.  Psychiatric:        Mood and Affect: Mood normal.        Behavior: Behavior normal.      Results for orders placed or performed in visit on 08/16/22  POCT HgB A1C  Result Value Ref Range   Hemoglobin A1C 9.5 (A) 4.0 - 5.6 %   HbA1c POC (<> result, manual entry)     HbA1c, POC (prediabetic range)     HbA1c, POC (controlled diabetic range)       Assessment & Plan:  Marland KitchenMarland KitchenGabor was seen today for follow-up and diabetes.  Diagnoses and all orders for this visit:  Uncontrolled type 2 diabetes mellitus with hyperglycemia (HCC) -     POCT HgB A1C -     Dulaglutide (TRULICITY) 1.5 MG/0.5ML SOPN; Inject 1.5 mg into the skin once a week. -     Continuous Glucose Sensor (FREESTYLE LIBRE 14 DAY SENSOR) MISC; 1 Application by Does not apply route every 14 (fourteen) days. Apply upper deltoid every 14 days, use reader to determine blood sugars  Controlled type 2 diabetes mellitus with other specified complication, without long-term current use of insulin (HCC) -     Dulaglutide (TRULICITY) 1.5 MG/0.5ML SOPN; Inject 1.5 mg into the skin once a week. -     AMBULATORY NON  FORMULARY MEDICATION; Glucometer lancets, strips to test blood sugar once a day for T2DM. -     Continuous Glucose Sensor (FREESTYLE LIBRE 14 DAY SENSOR) MISC; 1 Application by Does not apply route every 14 (fourteen) days. Apply upper deltoid every 14 days, use reader to determine blood sugars  Dyslipidemia  Hypertension goal BP (blood pressure) < 130/80 -     lisinopril (ZESTRIL) 2.5 MG tablet; TAKE 1 TABLET(2.5 MG) BY MOUTH DAILY  Gastroesophageal reflux disease without esophagitis -     omeprazole (PRILOSEC) 40 MG capsule; Take 1 capsule (40 mg total) by mouth daily.   A1c is not to goal  Will increase trulicity to 1.5mg  weekly Discussed improving diet  Plan to start  use of freestyle libre for BG monitoring. Foot exam UTD Eye exam UTD (Dec '23) will call for records BP to goal, continue lisinopril On statin  Declined flu/covid/tdap and pneumonia vaccines   Return in about 3 months (around 11/16/2022).    Tandy Gaw, PA-C

## 2022-10-12 DIAGNOSIS — G959 Disease of spinal cord, unspecified: Secondary | ICD-10-CM | POA: Diagnosis not present

## 2022-10-12 DIAGNOSIS — Z6838 Body mass index (BMI) 38.0-38.9, adult: Secondary | ICD-10-CM | POA: Diagnosis not present

## 2022-11-07 ENCOUNTER — Other Ambulatory Visit: Payer: Self-pay | Admitting: Physician Assistant

## 2022-11-07 DIAGNOSIS — I10 Essential (primary) hypertension: Secondary | ICD-10-CM

## 2022-11-17 ENCOUNTER — Ambulatory Visit (INDEPENDENT_AMBULATORY_CARE_PROVIDER_SITE_OTHER): Payer: BC Managed Care – PPO | Admitting: Physician Assistant

## 2022-11-17 ENCOUNTER — Encounter: Payer: Self-pay | Admitting: Physician Assistant

## 2022-11-17 VITALS — BP 132/81 | HR 88 | Ht 71.0 in | Wt 272.5 lb

## 2022-11-17 DIAGNOSIS — E785 Hyperlipidemia, unspecified: Secondary | ICD-10-CM

## 2022-11-17 DIAGNOSIS — E1169 Type 2 diabetes mellitus with other specified complication: Secondary | ICD-10-CM

## 2022-11-17 DIAGNOSIS — K219 Gastro-esophageal reflux disease without esophagitis: Secondary | ICD-10-CM | POA: Diagnosis not present

## 2022-11-17 DIAGNOSIS — F330 Major depressive disorder, recurrent, mild: Secondary | ICD-10-CM

## 2022-11-17 DIAGNOSIS — Z6838 Body mass index (BMI) 38.0-38.9, adult: Secondary | ICD-10-CM

## 2022-11-17 DIAGNOSIS — I10 Essential (primary) hypertension: Secondary | ICD-10-CM | POA: Diagnosis not present

## 2022-11-17 DIAGNOSIS — Z7984 Long term (current) use of oral hypoglycemic drugs: Secondary | ICD-10-CM

## 2022-11-17 LAB — POCT GLYCOSYLATED HEMOGLOBIN (HGB A1C): Hemoglobin A1C: 6.7 % — AB (ref 4.0–5.6)

## 2022-11-17 MED ORDER — ROSUVASTATIN CALCIUM 10 MG PO TABS: 10.0000 mg | ORAL_TABLET | Freq: Every day | ORAL | 3 refills | Status: DC

## 2022-11-17 MED ORDER — LISINOPRIL 2.5 MG PO TABS: ORAL_TABLET | ORAL | 3 refills | Status: DC

## 2022-11-17 MED ORDER — METFORMIN HCL 500 MG PO TABS: 500.0000 mg | ORAL_TABLET | Freq: Two times a day (BID) | ORAL | 3 refills | Status: DC

## 2022-11-17 MED ORDER — TRULICITY 1.5 MG/0.5ML ~~LOC~~ SOAJ: 1.5000 mg | SUBCUTANEOUS | 0 refills | Status: DC

## 2022-11-17 MED ORDER — OMEPRAZOLE 40 MG PO CPDR: 40.0000 mg | DELAYED_RELEASE_CAPSULE | Freq: Every day | ORAL | 3 refills | Status: AC

## 2022-11-17 NOTE — Progress Notes (Signed)
Established Patient Office Visit  Subjective   Patient ID: Kenneth Mcbride, male    DOB: [birth year]  Age: 37 y.o. MRN: [id number]  Chief Complaint  Patient presents with   Medical Management of Chronic Issues    DM last A1C 9.5    HPI Pt is a 37 yo obese male with T2DM, MDD, HLD, GERD who presents to the clinic for 3 month follow up.   Pt is doing great on trulicity and metformin. He has lost 5 more pounds. He denies any CP, palpitations, headaches, vision changes, constipation, abdominal pain. He was not able to tolerate placing the libre due to wanting to pass out. Not checking sugars. No open wounds. He is more active and denies any concerning low back pain. Mood is good. He is using his wife's omeprazole for GERD and would like refill of his own.   .. Active Ambulatory Problems    Diagnosis Date Noted   Tendinitis of left hand 01/16/2014   Lumbar spinal stenosis 05/20/2019   Class 3 severe obesity due to excess calories with serious comorbidity and body mass index (BMI) of 40.0 to 44.9 in adult (HCC) 08/12/2019   Diabetes mellitus type II, controlled (HCC) 08/12/2019   Spinal stenosis 08/27/2019   Injury of left ring finger 09/22/2019   Left fourth distal phalangeal exostosis 09/22/2019   Dyslipidemia 10/28/2019   Anxiety 12/01/2019   Elevated blood pressure reading without diagnosis of hypertension 12/26/2019   Acute nonintractable headache 12/26/2019   Dizziness 12/26/2019   Depressed mood 01/14/2020   Irritable 01/14/2020   Non-restorative sleep 01/14/2020   Snoring 01/14/2020   Poor concentration 01/14/2020   Change in consistency of stool 05/03/2020   Skin tag 05/03/2020   ADHD (attention deficit hyperactivity disorder), combined type 06/23/2020   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 (HCC) 06/23/2020   GAD (generalized anxiety disorder) 06/23/2020   Sinus headache 06/22/2021   Allergic sinusitis 06/22/2021   Seasonal allergic rhinitis due to pollen  06/22/2021   Acute right-sided low back pain with right-sided sciatica 07/14/2021   Neck pain 07/14/2021   Fracture of rib of right side 10/04/2021   Orthostatic hypotension 10/04/2021   Hypertension goal BP (blood pressure) < 130/80 02/10/2022   Thoracic spondylosis with myelopathy 03/10/2022   Bilateral leg cramps 05/16/2022   Gastroesophageal reflux disease without esophagitis 11/17/2022   Resolved Ambulatory Problems    Diagnosis Date Noted   Closed fracture of proximal phalanx of third finger of left hand 11/28/2013   Past Medical History:  Diagnosis Date   ADHD (attention deficit hyperactivity disorder)    Arthritis    COVID    Depression    Diabetes mellitus without complication (HCC)    Family history of adverse reaction to anesthesia    Fatty liver    Headache    Hypertension    No pertinent past medical history      ROS See HPI.    Objective:     BP 132/81 (BP Location: Right Arm, Patient Position: Sitting, Cuff Size: Large)   Pulse 88   Ht 5\' 11"  (1.803 m)   Wt 272 lb 8 oz (123.6 kg)   SpO2 100%   BMI 38.01 kg/m  BP Readings from Last 3 Encounters:  11/17/22 132/81  08/16/22 135/89  06/19/22 (!) 142/91   Wt Readings from Last 3 Encounters:  11/17/22 272 lb 8 oz (123.6 kg)  08/16/22 277 lb (125.6 kg)  07/18/22 275 lb (124.7 kg)   .[name]  Results for orders placed or performed in visit on 11/17/22  POCT HgB A1C  Result Value Ref Range   Hemoglobin A1C 6.7 (A) 4.0 - 5.6 %   HbA1c POC (<> result, manual entry)     HbA1c, POC (prediabetic range)     HbA1c, POC (controlled diabetic range)       Physical Exam Constitutional:      Appearance: Normal appearance. He is obese.  HENT:     Head: Normocephalic.  Cardiovascular:     Rate and Rhythm: Normal rate and regular rhythm.     Heart sounds: Normal heart sounds.  Pulmonary:     Effort: Pulmonary effort is normal.     Breath sounds: Normal breath sounds.  Neurological:     General: No focal  deficit present.     Mental Status: He is alert and oriented to person, place, and time.  Psychiatric:        Mood and Affect: Mood normal.       Assessment & Plan:  Marland KitchenMarland KitchenKrishi was seen today for medical management of chronic issues.  Diagnoses and all orders for this visit:  Controlled type 2 diabetes mellitus with other specified complication, without long-term current use of insulin (HCC) -     metFORMIN (GLUCOPHAGE) 500 MG tablet; Take 1 tablet (500 mg total) by mouth 2 (two) times daily with a meal. -     POCT HgB A1C -     Dulaglutide (TRULICITY) 1.5 MG/0.5ML SOPN; Inject 1.5 mg into the skin once a week.  Dyslipidemia -     rosuvastatin (CRESTOR) 10 MG tablet; Take 1 tablet (10 mg total) by mouth daily.  Hypertension goal BP (blood pressure) < 130/80 -     lisinopril (ZESTRIL) 2.5 MG tablet; TAKE 1 TABLET(2.5 MG) BY MOUTH DAILY  Gastroesophageal reflux disease without esophagitis -     omeprazole (PRILOSEC) 40 MG capsule; Take 1 capsule (40 mg total) by mouth daily.  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 (HCC)  Class 2 severe obesity due to excess calories with serious comorbidity and body mass index (BMI) of 38.0 to 38.9 in adult (HCC)   A1C improved GREATLY awesome job Continue same dose of Trulicity BP to goal Eye exam scheduled for December On statin Foot exam UTD Declined all vaccines today Follow up in 3 months  Omeprazole for GERD   Return in about 3 months (around 02/16/2023) for 40 minute appt next 3 months for mole removal.    Tandy Gaw, PA-C

## 2022-12-04 ENCOUNTER — Telehealth: Payer: Self-pay

## 2022-12-04 NOTE — Telephone Encounter (Signed)
Spoke with patient informed of denial through insurance company for the Omeprazole 40mg  DR -  He will check into [facility] and OTC options.

## 2023-02-02 ENCOUNTER — Encounter: Payer: Self-pay | Admitting: Physician Assistant

## 2023-02-02 NOTE — Telephone Encounter (Signed)
 Care team updated and letter sent for eye exam notes.

## 2023-02-14 ENCOUNTER — Encounter: Payer: Self-pay | Admitting: Physician Assistant

## 2023-02-14 DIAGNOSIS — E1169 Type 2 diabetes mellitus with other specified complication: Secondary | ICD-10-CM

## 2023-02-14 DIAGNOSIS — E785 Hyperlipidemia, unspecified: Secondary | ICD-10-CM

## 2023-02-14 DIAGNOSIS — I10 Essential (primary) hypertension: Secondary | ICD-10-CM

## 2023-02-16 ENCOUNTER — Ambulatory Visit: Payer: BC Managed Care – PPO | Admitting: Physician Assistant

## 2023-02-16 DIAGNOSIS — E1169 Type 2 diabetes mellitus with other specified complication: Secondary | ICD-10-CM | POA: Diagnosis not present

## 2023-02-16 DIAGNOSIS — E785 Hyperlipidemia, unspecified: Secondary | ICD-10-CM | POA: Diagnosis not present

## 2023-02-16 DIAGNOSIS — I10 Essential (primary) hypertension: Secondary | ICD-10-CM | POA: Diagnosis not present

## 2023-02-17 LAB — CMP14+EGFR
ALT: 34 [IU]/L (ref 0–44)
AST: 29 [IU]/L (ref 0–40)
Albumin: 4.4 g/dL (ref 4.1–5.1)
Alkaline Phosphatase: 127 [IU]/L — ABNORMAL HIGH (ref 44–121)
BUN/Creatinine Ratio: 15 (ref 9–20)
BUN: 15 mg/dL (ref 6–20)
Bilirubin Total: 0.6 mg/dL (ref 0.0–1.2)
CO2: 23 mmol/L (ref 20–29)
Calcium: 9.2 mg/dL (ref 8.7–10.2)
Chloride: 104 mmol/L (ref 96–106)
Creatinine, Ser: 0.98 mg/dL (ref 0.76–1.27)
Globulin, Total: 2.2 g/dL (ref 1.5–4.5)
Glucose: 150 mg/dL — ABNORMAL HIGH (ref 70–99)
Potassium: 4.8 mmol/L (ref 3.5–5.2)
Sodium: 141 mmol/L (ref 134–144)
Total Protein: 6.6 g/dL (ref 6.0–8.5)
eGFR: 101 mL/min/{1.73_m2} (ref >59)

## 2023-02-17 LAB — HEMOGLOBIN A1C
Est. average glucose Bld gHb Est-mCnc: 154 mg/dL
Hgb A1c MFr Bld: 7 % — ABNORMAL HIGH (ref 4.8–5.6)

## 2023-02-17 LAB — CBC WITH DIFFERENTIAL/PLATELET
Basophils Absolute: 0.1 10*3/uL (ref 0.0–0.2)
Basos: 1 %
EOS (ABSOLUTE): 0.1 10*3/uL (ref 0.0–0.4)
Eos: 1 %
Hematocrit: 45.7 % (ref 37.5–51.0)
Hemoglobin: 15.1 g/dL (ref 13.0–17.7)
Immature Grans (Abs): 0 10*3/uL (ref 0.0–0.1)
Immature Granulocytes: 0 %
Lymphocytes Absolute: 3.4 10*3/uL — ABNORMAL HIGH (ref 0.7–3.1)
Lymphs: 43 %
MCH: 27.2 pg (ref 26.6–33.0)
MCHC: 33 g/dL (ref 31.5–35.7)
MCV: 82 fL (ref 79–97)
Monocytes Absolute: 0.5 10*3/uL (ref 0.1–0.9)
Monocytes: 6 %
Neutrophils Absolute: 3.9 10*3/uL (ref 1.4–7.0)
Neutrophils: 49 %
Platelets: 228 10*3/uL (ref 150–450)
RBC: 5.55 x10E6/uL (ref 4.14–5.80)
RDW: 12.8 % (ref 11.6–15.4)
WBC: 8 10*3/uL (ref 3.4–10.8)

## 2023-02-17 LAB — LIPID PANEL
Chol/HDL Ratio: 5 {ratio} (ref 0.0–5.0)
Cholesterol, Total: 174 mg/dL (ref 100–199)
HDL: 35 mg/dL — ABNORMAL LOW (ref >39)
LDL Chol Calc (NIH): 110 mg/dL — ABNORMAL HIGH (ref 0–99)
Triglycerides: 162 mg/dL — ABNORMAL HIGH (ref 0–149)
VLDL Cholesterol Cal: 29 mg/dL (ref 5–40)

## 2023-02-19 ENCOUNTER — Ambulatory Visit: Payer: BC Managed Care – PPO | Admitting: Physician Assistant

## 2023-02-19 VITALS — BP 124/74 | HR 84 | Ht 71.0 in | Wt 280.0 lb

## 2023-02-19 DIAGNOSIS — E785 Hyperlipidemia, unspecified: Secondary | ICD-10-CM | POA: Diagnosis not present

## 2023-02-19 DIAGNOSIS — E1165 Type 2 diabetes mellitus with hyperglycemia: Secondary | ICD-10-CM | POA: Diagnosis not present

## 2023-02-19 DIAGNOSIS — F902 Attention-deficit hyperactivity disorder, combined type: Secondary | ICD-10-CM

## 2023-02-19 DIAGNOSIS — Z0000 Encounter for general adult medical examination without abnormal findings: Secondary | ICD-10-CM

## 2023-02-19 DIAGNOSIS — K5903 Drug induced constipation: Secondary | ICD-10-CM

## 2023-02-19 DIAGNOSIS — Z7984 Long term (current) use of oral hypoglycemic drugs: Secondary | ICD-10-CM

## 2023-02-19 MED ORDER — TRULANCE 3 MG PO TABS: 1.0000 | ORAL_TABLET | Freq: Every day | ORAL | 3 refills | Status: DC

## 2023-02-19 MED ORDER — TIRZEPATIDE 5 MG/0.5ML ~~LOC~~ SOAJ: 5.0000 mg | SUBCUTANEOUS | 0 refills | Status: DC

## 2023-02-19 MED ORDER — LISDEXAMFETAMINE DIMESYLATE 30 MG PO CAPS: 30.0000 mg | ORAL_CAPSULE | Freq: Every day | ORAL | 0 refills | Status: DC

## 2023-02-19 MED ORDER — ROSUVASTATIN CALCIUM 20 MG PO TABS: 20.0000 mg | ORAL_TABLET | Freq: Every day | ORAL | 3 refills | Status: DC

## 2023-02-19 NOTE — Progress Notes (Signed)
Established Patient Office Visit  Subjective   Patient ID: Kenneth Mcbride, male    DOB: 02/25/1986  Age: 36 y.o. MRN: [id number]  Chief Complaint  Patient presents with   Medical Management of Chronic Issues    Lab results    HPI Pt is a 37 yo male who presents to the clinic for general health check up and to go over labs. He is doing pretty good. He is having some constipation with trulicity and wonders if he could start back on the prescription medication that he was on. He is not checking sugars. He is not exercising. He would like to try another ADHd medication for focus. Adderall he did not like. He felt 'drained at end of day on it".   He already had labs drawn.   .. Active Ambulatory Problems    Diagnosis Date Noted   Tendinitis of left hand 01/16/2014   Lumbar spinal stenosis 05/20/2019   Class 3 severe obesity due to excess calories with serious comorbidity and body mass index (BMI) of 40.0 to 44.9 in adult (HCC) 08/12/2019   Diabetes mellitus type II, controlled (HCC) 08/12/2019   Spinal stenosis 08/27/2019   Injury of left ring finger 09/22/2019   Left fourth distal phalangeal exostosis 09/22/2019   Dyslipidemia 10/28/2019   Anxiety 12/01/2019   Elevated blood pressure reading without diagnosis of hypertension 12/26/2019   Acute nonintractable headache 12/26/2019   Dizziness 12/26/2019   Depressed mood 01/14/2020   Irritable 01/14/2020   Non-restorative sleep 01/14/2020   Snoring 01/14/2020   Poor concentration 01/14/2020   Change in consistency of stool 05/03/2020   Skin tag 05/03/2020   ADHD (attention deficit hyperactivity disorder), combined type 06/23/2020   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 (HCC) 06/23/2020   GAD (generalized anxiety disorder) 06/23/2020   Sinus headache 06/22/2021   Allergic sinusitis 06/22/2021   Seasonal allergic rhinitis due to pollen 06/22/2021   Acute right-sided low back pain with right-sided sciatica 07/14/2021    Neck pain 07/14/2021   Fracture of rib of right side 10/04/2021   Orthostatic hypotension 10/04/2021   Hypertension goal BP (blood pressure) < 130/80 02/10/2022   Thoracic spondylosis with myelopathy 03/10/2022   Bilateral leg cramps 05/16/2022   Gastroesophageal reflux disease without esophagitis 11/17/2022   Drug-induced constipation 02/19/2023   Resolved Ambulatory Problems    Diagnosis Date Noted   Closed fracture of proximal phalanx of third finger of left hand 11/28/2013   Past Medical History:  Diagnosis Date   ADHD (attention deficit hyperactivity disorder)    Arthritis    COVID    Depression    Diabetes mellitus without complication (HCC)    Family history of adverse reaction to anesthesia    Fatty liver    Headache    Hypertension    No pertinent past medical history        ROS See HPI.    Objective:     BP 124/74   Pulse 84   Ht 5\' 11"  (1.803 m)   Wt 280 lb (127 kg)   SpO2 99%   BMI 39.05 kg/m  BP Readings from Last 3 Encounters:  02/19/23 124/74  11/17/22 132/81  08/16/22 135/89   Wt Readings from Last 3 Encounters:  02/19/23 280 lb (127 kg)  11/17/22 272 lb 8 oz (123.6 kg)  08/16/22 277 lb (125.6 kg)    .[name] Results for orders placed or performed in visit on 02/14/23  Lipid Profile   Collection Time: 02/16/23  9:50 AM  Result Value Ref Range   Cholesterol, Total 174 100 - 199 mg/dL   Triglycerides 474 (H) 0 - 149 mg/dL   HDL 35 (L) >25 mg/dL   VLDL Cholesterol Cal 29 5 - 40 mg/dL   LDL Chol Calc (NIH) 956 (H) 0 - 99 mg/dL   Chol/HDL Ratio 5.0 0.0 - 5.0 ratio  CMP14+EGFR   Collection Time: 02/16/23  9:50 AM  Result Value Ref Range   Glucose 150 (H) 70 - 99 mg/dL   BUN 15 6 - 20 mg/dL   Creatinine, Ser 3.87 0.76 - 1.27 mg/dL   eGFR 564 >33 IR/JJO/8.41   BUN/Creatinine Ratio 15 9 - 20   Sodium 141 134 - 144 mmol/L   Potassium 4.8 3.5 - 5.2 mmol/L   Chloride 104 96 - 106 mmol/L   CO2 23 20 - 29 mmol/L   Calcium 9.2 8.7 - 10.2 mg/dL    Total Protein 6.6 6.0 - 8.5 g/dL   Albumin 4.4 4.1 - 5.1 g/dL   Globulin, Total 2.2 1.5 - 4.5 g/dL   Bilirubin Total 0.6 0.0 - 1.2 mg/dL   Alkaline Phosphatase 127 (H) 44 - 121 IU/L   AST 29 0 - 40 IU/L   ALT 34 0 - 44 IU/L  CBC w/Diff/Platelet   Collection Time: 02/16/23  9:50 AM  Result Value Ref Range   WBC 8.0 3.4 - 10.8 x10E3/uL   RBC 5.55 4.14 - 5.80 x10E6/uL   Hemoglobin 15.1 13.0 - 17.7 g/dL   Hematocrit 66.0 63.0 - 51.0 %   MCV 82 79 - 97 fL   MCH 27.2 26.6 - 33.0 pg   MCHC 33.0 31.5 - 35.7 g/dL   RDW 16.0 10.9 - 32.3 %   Platelets 228 150 - 450 x10E3/uL   Neutrophils 49 Not Estab. %   Lymphs 43 Not Estab. %   Monocytes 6 Not Estab. %   Eos 1 Not Estab. %   Basos 1 Not Estab. %   Neutrophils Absolute 3.9 1.4 - 7.0 x10E3/uL   Lymphocytes Absolute 3.4 (H) 0.7 - 3.1 x10E3/uL   Monocytes Absolute 0.5 0.1 - 0.9 x10E3/uL   EOS (ABSOLUTE) 0.1 0.0 - 0.4 x10E3/uL   Basophils Absolute 0.1 0.0 - 0.2 x10E3/uL   Immature Granulocytes 0 Not Estab. %   Immature Grans (Abs) 0.0 0.0 - 0.1 x10E3/uL  HgB A1c   Collection Time: 02/16/23  9:50 AM  Result Value Ref Range   Hgb A1c MFr Bld 7.0 (H) 4.8 - 5.6 %   Est. average glucose Bld gHb Est-mCnc 154 mg/dL     Physical Exam      Assessment & Plan:  Marland KitchenMarland KitchenLarence was seen today for medical management of chronic issues.  Diagnoses and all orders for this visit:  Routine physical examination  ADHD (attention deficit hyperactivity disorder), combined type -     lisdexamfetamine (VYVANSE) 30 MG capsule; Take 1 capsule (30 mg total) by mouth daily. -     lisdexamfetamine (VYVANSE) 30 MG capsule; Take 1 capsule (30 mg total) by mouth daily. -     lisdexamfetamine (VYVANSE) 30 MG capsule; Take 1 capsule (30 mg total) by mouth daily.  Dyslipidemia -     rosuvastatin (CRESTOR) 20 MG tablet; Take 1 tablet (20 mg total) by mouth daily.  Uncontrolled type 2 diabetes mellitus with hyperglycemia (HCC) -     tirzepatide (MOUNJARO) 5  MG/0.5ML Pen; Inject 5 mg into the skin once a week.  Drug-induced constipation -  Plecanatide (TRULANCE) 3 MG TABS; Take 1 tablet (3 mg total) by mouth daily.   Reviewed labs:  Elevated TG and LDL(110) Elevated A1C at 7 Vitals look great  Continue on metformin Stop trulicity and start mounjaro for better A1C reduction Discussed this should help with TG as well Encouraged daily walking and avoid sugars and carbs Needs eye exam Crestor increased to 20mg  Trulance sent to take daily for constipation  Trial of vyanse for ADHD  Return in about 3 months (around 05/20/2023).    Tandy Gaw, PA-C

## 2023-02-19 NOTE — Patient Instructions (Signed)
Start mounjaro 5mg  weekly Increased crestor to 20mg  daily Start vyvanse

## 2023-02-23 ENCOUNTER — Encounter: Payer: Self-pay | Admitting: Physician Assistant

## 2023-04-12 ENCOUNTER — Other Ambulatory Visit: Payer: Self-pay | Admitting: Physician Assistant

## 2023-04-12 DIAGNOSIS — E1165 Type 2 diabetes mellitus with hyperglycemia: Secondary | ICD-10-CM

## 2023-04-12 MED ORDER — TIRZEPATIDE 5 MG/0.5ML ~~LOC~~ SOAJ: 5.0000 mg | SUBCUTANEOUS | 0 refills | Status: DC

## 2023-04-16 ENCOUNTER — Other Ambulatory Visit: Payer: Self-pay | Admitting: Neurological Surgery

## 2023-04-16 DIAGNOSIS — G959 Disease of spinal cord, unspecified: Secondary | ICD-10-CM

## 2023-04-17 ENCOUNTER — Encounter: Payer: Self-pay | Admitting: Neurological Surgery

## 2023-05-06 ENCOUNTER — Ambulatory Visit
Admission: RE | Admit: 2023-05-06 | Discharge: 2023-05-06 | Disposition: A | Discharge status: Home / Self Care | Payer: BC Managed Care – PPO | Source: Ambulatory Visit | Attending: Neurological Surgery | Admitting: Neurological Surgery

## 2023-05-06 DIAGNOSIS — M4804 Spinal stenosis, thoracic region: Secondary | ICD-10-CM | POA: Diagnosis not present

## 2023-05-06 DIAGNOSIS — M47814 Spondylosis without myelopathy or radiculopathy, thoracic region: Secondary | ICD-10-CM | POA: Diagnosis not present

## 2023-05-06 DIAGNOSIS — M546 Pain in thoracic spine: Secondary | ICD-10-CM | POA: Diagnosis not present

## 2023-05-06 DIAGNOSIS — G9589 Other specified diseases of spinal cord: Secondary | ICD-10-CM | POA: Diagnosis not present

## 2023-05-06 DIAGNOSIS — G959 Disease of spinal cord, unspecified: Secondary | ICD-10-CM

## 2023-05-15 DIAGNOSIS — M4804 Spinal stenosis, thoracic region: Secondary | ICD-10-CM | POA: Diagnosis not present

## 2023-05-15 DIAGNOSIS — G959 Disease of spinal cord, unspecified: Secondary | ICD-10-CM | POA: Diagnosis not present

## 2023-05-17 ENCOUNTER — Other Ambulatory Visit: Payer: Self-pay | Admitting: Neurological Surgery

## 2023-05-17 DIAGNOSIS — G959 Disease of spinal cord, unspecified: Secondary | ICD-10-CM

## 2023-05-21 ENCOUNTER — Ambulatory Visit (INDEPENDENT_AMBULATORY_CARE_PROVIDER_SITE_OTHER): Payer: BC Managed Care – PPO | Admitting: Physician Assistant

## 2023-05-21 VITALS — BP 130/80 | HR 80 | Ht 71.0 in | Wt 282.0 lb

## 2023-05-21 DIAGNOSIS — Z7984 Long term (current) use of oral hypoglycemic drugs: Secondary | ICD-10-CM

## 2023-05-21 DIAGNOSIS — I10 Essential (primary) hypertension: Secondary | ICD-10-CM

## 2023-05-21 DIAGNOSIS — E785 Hyperlipidemia, unspecified: Secondary | ICD-10-CM | POA: Diagnosis not present

## 2023-05-21 DIAGNOSIS — E66812 Obesity, class 2: Secondary | ICD-10-CM

## 2023-05-21 DIAGNOSIS — Z6839 Body mass index (BMI) 39.0-39.9, adult: Secondary | ICD-10-CM

## 2023-05-21 DIAGNOSIS — E1165 Type 2 diabetes mellitus with hyperglycemia: Secondary | ICD-10-CM | POA: Diagnosis not present

## 2023-05-21 LAB — POCT GLYCOSYLATED HEMOGLOBIN (HGB A1C): Hemoglobin A1C: 7.3 % — AB (ref 4.0–5.6)

## 2023-05-21 MED ORDER — TIRZEPATIDE 7.5 MG/0.5ML ~~LOC~~ SOAJ: 7.5000 mg | SUBCUTANEOUS | 0 refills | Status: DC

## 2023-05-21 NOTE — Progress Notes (Unsigned)
 Established Patient Office Visit  Subjective   Patient ID: Kenneth Mcbride, male    DOB: Apr 13, 1985  Age: 38 y.o. MRN: [id number]  Chief Complaint  Patient presents with   Medical Management of Chronic Issues    Last A1c 7.0    HPI Pt is a 38 yo male with T2DM, HTN, HLD who presents to the clinic for follow up and medication refills.   Pt is not checking sugars. Pt is taking mounjaro weekly and metformin. He is not exercising. He is trying to drink less sodas. He denies any CP, palpitaitons, or headaches. He is compliant with medications.   Review of Systems  All other systems reviewed and are negative.     Objective:     BP 130/80   Pulse 80   Ht 5\' 11"  (1.803 m)   Wt 282 lb (127.9 kg)   SpO2 99%   BMI 39.33 kg/m  BP Readings from Last 3 Encounters:  05/21/23 130/80  02/19/23 124/74  11/17/22 132/81   Wt Readings from Last 3 Encounters:  05/21/23 282 lb (127.9 kg)  02/19/23 280 lb (127 kg)  11/17/22 272 lb 8 oz (123.6 kg)    .[name] Results for orders placed or performed in visit on 05/21/23  POCT HgB A1C   Collection Time: 05/21/23  8:13 AM  Result Value Ref Range   Hemoglobin A1C 7.3 (A) 4.0 - 5.6 %   HbA1c POC (<> result, manual entry)     HbA1c, POC (prediabetic range)     HbA1c, POC (controlled diabetic range)     ..    05/21/2023    8:11 AM 02/19/2023    3:44 PM 11/17/2022   10:17 AM 08/16/2022    9:26 AM 05/30/2022   10:06 AM  Depression screen PHQ 2/9  Decreased Interest 0 0 0 0 0  Down, Depressed, Hopeless 0 0 0 0 0  PHQ - 2 Score 0 0 0 0 0  Altered sleeping  0     Tired, decreased energy  2     Change in appetite  1     Feeling bad or failure about yourself   0     Trouble concentrating  2     Moving slowly or fidgety/restless  0     Suicidal thoughts  0     PHQ-9 Score  5     Difficult doing work/chores  Somewhat difficult        Physical Exam Constitutional:      Appearance: Normal appearance. He is obese.  HENT:     Head:  Normocephalic.  Cardiovascular:     Rate and Rhythm: Normal rate and regular rhythm.  Pulmonary:     Effort: Pulmonary effort is normal.     Breath sounds: Normal breath sounds.  Musculoskeletal:     Right lower leg: No edema.     Left lower leg: No edema.  Neurological:     General: No focal deficit present.     Mental Status: He is alert and oriented to person, place, and time.  Psychiatric:        Mood and Affect: Mood normal.         Assessment & Plan:  Marland KitchenMarland KitchenRanden was seen today for medical management of chronic issues.  Diagnoses and all orders for this visit:  Uncontrolled type 2 diabetes mellitus with hyperglycemia (HCC) -     POCT HgB A1C -     Cancel: POCT UA - Microalbumin -  tirzepatide (MOUNJARO) 7.5 MG/0.5ML Pen; Inject 7.5 mg into the skin once a week. -     AMBULATORY NON FORMULARY MEDICATION; Glucometer lancets, strips to test blood sugar once a day for T2DM.  Dyslipidemia  Hypertension goal BP (blood pressure) < 130/80  Class 2 severe obesity due to excess calories with serious comorbidity and body mass index (BMI) of 39.0 to 39.9 in adult (HCC)   A1C not controlled Increased mounjaro to 7.5mg  weekly Continue metformin Not able to collect urine today for microalbumin BP to goal, on ace Eye exam scheduled On statin Foot exam UTD Declined pneumonia vaccine today     Return in about 3 months (around 08/21/2023), or if symptoms worsen or fail to improve.    Tandy Gaw, PA-C

## 2023-05-21 NOTE — Patient Instructions (Signed)
Increased mounjaro to 7.'5mg'$  weekly.

## 2023-05-22 ENCOUNTER — Encounter: Payer: Self-pay | Admitting: Physician Assistant

## 2023-05-22 DIAGNOSIS — E1165 Type 2 diabetes mellitus with hyperglycemia: Secondary | ICD-10-CM | POA: Insufficient documentation

## 2023-05-22 MED ORDER — AMBULATORY NON FORMULARY MEDICATION
Status: AC
Start: 2023-05-22 — End: ?

## 2023-05-23 DIAGNOSIS — E119 Type 2 diabetes mellitus without complications: Secondary | ICD-10-CM | POA: Diagnosis not present

## 2023-05-23 LAB — HM DIABETES EYE EXAM

## 2023-06-01 ENCOUNTER — Ambulatory Visit
Admission: RE | Admit: 2023-06-01 | Discharge: 2023-06-01 | Disposition: A | Discharge status: Home / Self Care | Source: Ambulatory Visit | Attending: Neurological Surgery | Admitting: Neurological Surgery

## 2023-06-01 DIAGNOSIS — M4804 Spinal stenosis, thoracic region: Secondary | ICD-10-CM | POA: Diagnosis not present

## 2023-06-01 DIAGNOSIS — M47814 Spondylosis without myelopathy or radiculopathy, thoracic region: Secondary | ICD-10-CM | POA: Diagnosis not present

## 2023-06-01 DIAGNOSIS — G959 Disease of spinal cord, unspecified: Secondary | ICD-10-CM

## 2023-06-07 ENCOUNTER — Other Ambulatory Visit: Payer: Self-pay | Admitting: Physician Assistant

## 2023-06-07 DIAGNOSIS — E1165 Type 2 diabetes mellitus with hyperglycemia: Secondary | ICD-10-CM

## 2023-06-28 DIAGNOSIS — M4804 Spinal stenosis, thoracic region: Secondary | ICD-10-CM | POA: Diagnosis not present

## 2023-06-28 DIAGNOSIS — Z6837 Body mass index (BMI) 37.0-37.9, adult: Secondary | ICD-10-CM | POA: Diagnosis not present

## 2023-07-25 DIAGNOSIS — M4804 Spinal stenosis, thoracic region: Secondary | ICD-10-CM | POA: Diagnosis not present

## 2023-07-25 DIAGNOSIS — Z9889 Other specified postprocedural states: Secondary | ICD-10-CM | POA: Diagnosis not present

## 2023-07-25 DIAGNOSIS — R2689 Other abnormalities of gait and mobility: Secondary | ICD-10-CM | POA: Diagnosis not present

## 2023-07-29 DIAGNOSIS — M4712 Other spondylosis with myelopathy, cervical region: Secondary | ICD-10-CM | POA: Diagnosis not present

## 2023-07-29 DIAGNOSIS — M48061 Spinal stenosis, lumbar region without neurogenic claudication: Secondary | ICD-10-CM | POA: Diagnosis not present

## 2023-07-29 DIAGNOSIS — M5126 Other intervertebral disc displacement, lumbar region: Secondary | ICD-10-CM | POA: Diagnosis not present

## 2023-07-29 DIAGNOSIS — M47816 Spondylosis without myelopathy or radiculopathy, lumbar region: Secondary | ICD-10-CM | POA: Diagnosis not present

## 2023-08-20 ENCOUNTER — Other Ambulatory Visit: Payer: Self-pay

## 2023-08-20 ENCOUNTER — Encounter: Payer: Self-pay | Admitting: Physician Assistant

## 2023-08-20 ENCOUNTER — Ambulatory Visit (INDEPENDENT_AMBULATORY_CARE_PROVIDER_SITE_OTHER): Admitting: Physician Assistant

## 2023-08-20 VITALS — BP 138/78 | HR 78 | Ht 71.0 in | Wt 284.0 lb

## 2023-08-20 DIAGNOSIS — F902 Attention-deficit hyperactivity disorder, combined type: Secondary | ICD-10-CM | POA: Diagnosis not present

## 2023-08-20 DIAGNOSIS — I10 Essential (primary) hypertension: Secondary | ICD-10-CM | POA: Diagnosis not present

## 2023-08-20 DIAGNOSIS — E1165 Type 2 diabetes mellitus with hyperglycemia: Secondary | ICD-10-CM | POA: Diagnosis not present

## 2023-08-20 DIAGNOSIS — E66812 Obesity, class 2: Secondary | ICD-10-CM

## 2023-08-20 DIAGNOSIS — E785 Hyperlipidemia, unspecified: Secondary | ICD-10-CM | POA: Diagnosis not present

## 2023-08-20 DIAGNOSIS — Z6839 Body mass index (BMI) 39.0-39.9, adult: Secondary | ICD-10-CM

## 2023-08-20 DIAGNOSIS — M48062 Spinal stenosis, lumbar region with neurogenic claudication: Secondary | ICD-10-CM

## 2023-08-20 LAB — POCT GLYCOSYLATED HEMOGLOBIN (HGB A1C): Hemoglobin A1C: 9.3 % — AB (ref 4.0–5.6)

## 2023-08-20 LAB — POCT UA - MICROALBUMIN
Albumin/Creatinine Ratio, Urine, POC: <30
Creatinine, POC: 100 mg/dL
Microalbumin Ur, POC: 30 mg/L

## 2023-08-20 MED ORDER — FREESTYLE LIBRE 3 PLUS SENSOR MISC: 2 refills | Status: DC

## 2023-08-20 MED ORDER — TIRZEPATIDE 7.5 MG/0.5ML ~~LOC~~ SOAJ: 7.5000 mg | SUBCUTANEOUS | 0 refills | Status: DC

## 2023-08-20 MED ORDER — TIRZEPATIDE 5 MG/0.5ML ~~LOC~~ SOAJ: 5.0000 mg | SUBCUTANEOUS | 0 refills | Status: DC

## 2023-08-20 MED ORDER — MOUNJARO 2.5 MG/0.5ML ~~LOC~~ SOAJ: 2.5000 mg | SUBCUTANEOUS | 0 refills | Status: DC

## 2023-08-20 NOTE — Progress Notes (Signed)
 Established Patient Office Visit  Subjective   Patient ID: Kenneth Mcbride, male    DOB: 07/11/1985  Age: 37 y.o. MRN: [id number]  Chief Complaint  Patient presents with   Medical Management of Chronic Issues    3 mo fup on dm last A1c 7.3    HPI Kenneth Mcbride is here for a 3 month follow up on diabetes. He is taking metformin  but was unable to get Mounjaro  because it was 400$. He is trying to eat more protein and cut back on sugars. He has started eating white fish and salmon. He is trying to exercise more but it is hard because he has severe foraminal stenosis at multiple levels in his lumbar spine. He states he had two surgeries for it in the past few years and just found out he needs another one. He has an appointment with rheumatology on July 1st to figure out why my back gets bad so fast.  About a month ago he was Pelotoning every day for about two weeks and went on a vacation where they walked 5 miles a day, but then his back flared up and he had to stop for about two weeks. He is now trying to [facility] when he can tolerate it. He is drinking more water and trying to cut back on soda.   Pt has severe lumbar spinal stenosis and had 2 back surgeries already. He has seen another neurosurgeon, Dr. Vonna, he has ordered new imaging.   He has appt for rheumatologist for more evaluation regarding lumbar spinal stenosis.   He is taking lisinopril  for BP and rosuvastatin  for his cholesterol. He is tolerating them well. Denies chest pain or pressure, Palpitations.    Review of Systems  Cardiovascular:  Negative for chest pain and palpitations.  Musculoskeletal:  Positive for back pain.      Objective:     BP 138/78   Pulse 78   Ht 5' 11 (1.803 m)   Wt 284 lb (128.8 kg)   SpO2 99%   BMI 39.61 kg/m  BP Readings from Last 3 Encounters:  08/20/23 138/78  05/21/23 130/80  02/19/23 124/74   Wt Readings from Last 3 Encounters:  08/20/23 284 lb (128.8 kg)  05/21/23 282 lb (127.9 kg)   02/19/23 280 lb (127 kg)      Physical Exam Constitutional:      Appearance: Normal appearance. He is obese.  HENT:     Head: Normocephalic.   Cardiovascular:     Rate and Rhythm: Normal rate and regular rhythm.     Heart sounds: Normal heart sounds.  Pulmonary:     Effort: Pulmonary effort is normal.     Breath sounds: Normal breath sounds.   Neurological:     Mental Status: He is alert and oriented to person, place, and time.   Psychiatric:        Mood and Affect: Mood normal.      Results for orders placed or performed in visit on 08/20/23  POCT HgB A1C  Result Value Ref Range   Hemoglobin A1C 9.3 (A) 4.0 - 5.6 %   HbA1c POC (<> result, manual entry)     HbA1c, POC (prediabetic range)     HbA1c, POC (controlled diabetic range)    POCT UA - Microalbumin  Result Value Ref Range   Microalbumin Ur, POC 30 mg/L   Creatinine, POC 100 mg/dL   Albumin /Creatinine Ratio, Urine, POC <30     The 10-year ASCVD risk score (Arnett  DK, et al., 2019) is: 3.6%    Assessment & Plan:  SABRASABRADaryl was seen today for medical management of chronic issues.  Diagnoses and all orders for this visit:  Uncontrolled type 2 diabetes mellitus with hyperglycemia (HCC) -     POCT HgB A1C -     POCT UA - Microalbumin -     tirzepatide  (MOUNJARO ) 2.5 MG/0.5ML Pen; Inject 2.5 mg into the skin once a week. -     tirzepatide  (MOUNJARO ) 5 MG/0.5ML Pen; Inject 5 mg into the skin once a week. -     tirzepatide  (MOUNJARO ) 7.5 MG/0.5ML Pen; Inject 7.5 mg into the skin once a week. -     AMB Referral VBCI Care Management -     Continuous Glucose Sensor (FREESTYLE LIBRE 3 PLUS SENSOR) MISC; Change sensor every 15 days.  Dyslipidemia  Hypertension goal BP (blood pressure) < 130/80  Class 2 severe obesity due to excess calories with serious comorbidity and body mass index (BMI) of 39.0 to 39.9 in adult [employer])  ADHD (attention deficit hyperactivity disorder), combined type  Spinal stenosis of  lumbar region with neurogenic claudication   A1C: 9.7% today (last was 7.3%), not to goal Spoke to him extensively about proper diet including adding more lean protein and what sources of lean protein are (chicken/ [place] breast, white fish, egg whites etc.) emphasized the need to cut back on sodas and sweets. Add in exercise as tolerated, suggested alternating between Peloton and walking for exercise because he stated that Peloton hurts his back sometimes but walking feels fine. He is following closely with Dr. Dibble(neurologist) and has evaluation with rheumatology(July 1st). Meds: Continue metformin , restart Mounjaro . Will reach out and get pharmacy on board to ensure he gets medication. Would like for him to use CGM, libre 3 to help see trends and improvements. Sent to pharmacy.  BP to goal, continue lisinopril    Microalbumin: last microalbumin was 0.9 on 3/24, microalbumin today in normal range LDL: 110 on 12/24, he is taking Crestor  20   Last eye exam on 3/25 showed no retinopathy  Foot exam: UTD  Declined HPV and pneumococcal    Follow up 3 months  Return in about 3 months (around 11/20/2023).    Prince Couey, PA-C

## 2023-08-21 ENCOUNTER — Other Ambulatory Visit (HOSPITAL_COMMUNITY): Payer: Self-pay

## 2023-08-21 ENCOUNTER — Encounter: Payer: Self-pay | Admitting: Physician Assistant

## 2023-08-21 ENCOUNTER — Telehealth: Payer: Self-pay

## 2023-08-21 ENCOUNTER — Telehealth: Payer: Self-pay | Admitting: *Deleted

## 2023-08-21 NOTE — Telephone Encounter (Signed)
 Pharmacy Patient Advocate Encounter   Received notification from RX Request Messages that prior authorization for Mounjaro  2.5mg /0.52ml is required/requested.   Insurance verification completed.   The patient is insured through [facility] .   Per test claim: PA required; PA submitted to above mentioned insurance via CoverMyMeds Key/confirmation #/EOC  [id number] Status is pending

## 2023-08-21 NOTE — Progress Notes (Unsigned)
 Care Guide Pharmacy Note  08/21/2023 Name: Kenneth Mcbride MRN: [id number] DOB: Nov 28, 1985  Referred By: Antoniette Vermell CROME, PA-C Reason for referral: Complex Care Management (Outreach to schedule referral with pharmacist ) and Call Attempt #1   [name] is a 38 y.o. year old male who is a primary care patient of Breeback, Jade L, PA-C.  Garnette Hammonds was referred to the pharmacist for assistance related to: DMII  An unsuccessful telephone outreach was attempted today to contact the patient who was referred to the pharmacy team for assistance with medication assistance. Additional attempts will be made to contact the patient.  Thedford Franks, CMA [place]  [facility], [facility] Guide Direct Dial: [phone]  Fax: [phone] Website: [email or web address]

## 2023-08-21 NOTE — Telephone Encounter (Signed)
 Pharmacy Patient Advocate Encounter  Received notification from [facility] that Prior Authorization for Mounjaro  2.5mg /0.47ml has been CANCELLED due to prior authorization not required.      Per test claim, the Mounjaro  2.5mg /0.98ml copay for 28 days is $399.10 if filled at one of [employer].

## 2023-08-22 NOTE — Progress Notes (Signed)
 Care Guide Pharmacy Note  08/22/2023 Name: Sedric Guia MRN: [id number] DOB: 10/16/1985  Referred By: Antoniette Vermell CROME, PA-C Reason for referral: Complex Care Management (Outreach to schedule referral with pharmacist ) and Call Attempt #1   Dimitriy Carreras is a 38 y.o. year old male who is a primary care patient of Breeback, Jade L, PA-C.  Garnette Hammonds was referred to the pharmacist for assistance related to: DMII  Successful contact was made with the patient to discuss pharmacy services including being ready for the pharmacist to call at least 5 minutes before the scheduled appointment time and to have medication bottles and any blood pressure readings ready for review. The patient agreed to meet with the pharmacist via telephone visit on 08/29/2023  Thedford Franks, CMA [place]  [facility], [facility] Guide Direct Dial: [phone]  Fax: [phone] Website: [email or web address]

## 2023-08-28 DIAGNOSIS — M533 Sacrococcygeal disorders, not elsewhere classified: Secondary | ICD-10-CM | POA: Diagnosis not present

## 2023-08-28 DIAGNOSIS — M545 Low back pain, unspecified: Secondary | ICD-10-CM | POA: Diagnosis not present

## 2023-08-28 DIAGNOSIS — M546 Pain in thoracic spine: Secondary | ICD-10-CM | POA: Diagnosis not present

## 2023-08-29 ENCOUNTER — Other Ambulatory Visit: Payer: Self-pay

## 2023-08-29 NOTE — Progress Notes (Unsigned)
 08/29/2023 Name: Kenneth Mcbride MRN: [id number] DOB: 07/17/1985  Chief Complaint  Patient presents with   Diabetes Management Plan   Kenneth Mcbride is a 38 y.o. year old male who presented for a telephone visit.   They were referred to the pharmacist by their PCP for assistance in managing diabetes.   Subjective:  Care Team: Primary Care Provider: Breeback, Jade L, PA-C ; Next Scheduled Visit: 9/22  Medication Access/Adherence  Current Pharmacy:  [id number] - [place], [place] - [address] AT [facility] [address] [place]  [address] Phone: [phone] Fax: [phone]  [facility] - [place], [place] - [address] [place] [address] Phone: [phone] Fax: [phone]  -Patient reports affordability concerns with their medications: Yes  -Patient reports access/transportation concerns to their pharmacy: No  -Patient reports adherence concerns with their medications:  Yes    Diabetes: Current medications: metformin  500mg  BID with meals -Patient previously using Mounjaro  in the past to help manage diabetes; medication was covered by insurance at $25 copay (even earlier this year), but patient's copay is now $529 per [facility]. -Last A1c elevated at 9.3% on 08/20/2023 up from 7.3% in March  Objective:  Lab Results  Component Value Date   HGBA1C 9.3 (A) 08/20/2023   Lab Results  Component Value Date   CREATININE 0.98 02/16/2023   BUN 15 02/16/2023   NA 141 02/16/2023   K 4.8 02/16/2023   CL 104 02/16/2023   CO2 23 02/16/2023   Medications Reviewed Today     Reviewed by Deanna Channing LABOR, RPH (Pharmacist) on 08/29/23 at 1422  Med List Status: <None>   Medication Order Taking? Sig Documenting Provider Last Dose Status Informant  albuterol  (VENTOLIN  HFA) 108 (90 Base) MCG/ACT inhaler [id number]  Inhale 2 puffs into the lungs every 6 (six) hours as needed. Antoniette Vermell CROME, PA-C  Active   AMBULATORY NON FORMULARY MEDICATION [id number]  Glucometer lancets, strips to test blood sugar once a day for T2DM. Antoniette Vermell CROME, PA-C  Active   Continuous Glucose Sensor (FREESTYLE LIBRE 3 PLUS SENSOR) MISC [id number]  Change sensor every 15 days.  Patient not taking: Reported on 08/29/2023   Antoniette Vermell CROME, PA-C  Active   fluticasone  (FLONASE ) 50 MCG/ACT nasal spray [id number]  Place 2 sprays into both nostrils daily. Antoniette Vermell CROME, PA-C  Active Self  ibuprofen  (ADVIL ) 800 MG tablet [id number]  Take 1 tablet (800 mg total) by mouth every 8 (eight) hours as needed. Joshua Alm Hamilton, MD  Active   lisinopril  (ZESTRIL ) 2.5 MG tablet [id number] Yes TAKE 1 TABLET(2.5 MG) BY MOUTH DAILY Breeback, Jade L, PA-C  Active   metFORMIN  (GLUCOPHAGE ) 500 MG tablet [id number]  Take 1 tablet (500 mg total) by mouth 2 (two) times daily with a meal. Breeback, Jade L, PA-C  Active   Multiple Vitamins-Minerals (MULTIVITAMIN WITH MINERALS) tablet [id number]  Take 1 tablet by mouth daily. [provider]  Active Self  omeprazole  (PRILOSEC) 40 MG capsule [id number]  Take 1 capsule (40 mg total) by mouth daily. Breeback, Jade L, PA-C  Active   Plecanatide  (TRULANCE ) 3 MG TABS [id number]  Take 1 tablet (3 mg total) by mouth daily.  Patient not taking: Reported on 08/29/2023   Antoniette Vermell CROME, PA-C  Active   rosuvastatin  (CRESTOR ) 20 MG tablet [id number] Yes Take 1 tablet (20 mg total) by mouth daily. Breeback, Jade L,  PA-C  Active   SUMAtriptan  (IMITREX ) 50 MG tablet [id number]  Take 1 tab (50 mg) by mouth for migraine headache. May repeat dose one time if headache has not subsided in 2 hours. Do not take more than 2 tabs (100 mg) in a 24 hour period. Breeback, Jade L, PA-C  Active Self  tirzepatide  (MOUNJARO ) 2.5 MG/0.5ML Pen [id number]  Inject 2.5 mg into the skin once a week.  Patient not taking: Reported on 08/29/2023   Antoniette Vermell CROME, PA-C  Active   tirzepatide  (MOUNJARO ) 5 MG/0.5ML Pen  [id number]  Inject 5 mg into the skin once a week.  Patient not taking: Reported on 08/29/2023   Breeback, Jade L, PA-C  Active   tirzepatide  (MOUNJARO ) 7.5 MG/0.5ML Pen [id number]  Inject 7.5 mg into the skin once a week.  Patient not taking: Reported on 08/29/2023   Breeback, Jade L, PA-C  Active   tirzepatide  (MOUNJARO ) 7.5 MG/0.5ML Pen [id number]  Inject 7.5 mg into the skin once a week.  Patient not taking: Reported on 08/29/2023   Antoniette Vermell CROME, PA-C  Active            Assessment/Plan:   Diabetes: -Currently uncontrolled -[employer] [facility], and patient has an out ot pocket amount of $5000 that must be met before drug copays go to $0.  Until this is met, he will pay 50% drug cost for any Tier 3 medications.  GLP1 options covered by the plan are Mounjaro , Ozempic, Trulicity , and Rybelsus; and these are all Tier 3.  Insurance representative states patient has approximately another $1300 to pay out of pocket before his totally of $5000 is reached. -Obtained Mounjaro  copay card and provided to pharmacy: RXBIN: [id number] PCN: [id number] GRP: [id number] ID: [id number] Expiration Date: 02/27/2024 -Copay card took patient's cost to $399.  Contacting patient to see if this would be affordable.  If not, sometimes the Trulicity  copay card will deduct more than copay cards for the other GLP1 medications covered by patient's plan, so we could try that.  [name], PharmD, DPLA

## 2023-09-04 ENCOUNTER — Other Ambulatory Visit (HOSPITAL_COMMUNITY): Payer: Self-pay

## 2023-09-11 DIAGNOSIS — R2689 Other abnormalities of gait and mobility: Secondary | ICD-10-CM | POA: Diagnosis not present

## 2023-09-11 DIAGNOSIS — Z9889 Other specified postprocedural states: Secondary | ICD-10-CM | POA: Diagnosis not present

## 2023-09-11 DIAGNOSIS — M4804 Spinal stenosis, thoracic region: Secondary | ICD-10-CM | POA: Diagnosis not present

## 2023-09-11 DIAGNOSIS — M436 Torticollis: Secondary | ICD-10-CM | POA: Diagnosis not present

## 2023-09-13 ENCOUNTER — Telehealth: Payer: Self-pay

## 2023-09-13 NOTE — Progress Notes (Unsigned)
   09/13/2023  Patient ID: Kenneth Mcbride, male   DOB: 1985-06-30, 38 y.o.   MRN: [id number]  Patient outreach to follow-up on management of diabetes.  GLP1 medications are not affordable on patient's insurance, so he is currently on metformin  500mg  BID to manage diabetes.  Last A1c was elevated at 9.8%, and FBG this morning was 185; so additional pharmacotherapy is warranted.  Discussed addition of Januvia, which would be $15 with insurance and copay card.  Patient is open to this option, so I will consult PCP.  I recommend starting with Januvia 50mg  daily and increasing to 100mg  daily if patient is tolerating well in 1 month.  Next A1c will be due in September; if >7%, I also recommend increasing metformin  to 1000mg  BID (could used XR formulation to prevent GI upset).  [name], PharmD, DPLA

## 2023-09-14 ENCOUNTER — Telehealth: Payer: Self-pay

## 2023-09-14 ENCOUNTER — Other Ambulatory Visit: Payer: Self-pay | Admitting: Physician Assistant

## 2023-09-14 MED ORDER — SITAGLIPTIN PHOSPHATE 50 MG PO TABS: 50.0000 mg | ORAL_TABLET | Freq: Every day | ORAL | 1 refills | Status: DC

## 2023-09-14 NOTE — Telephone Encounter (Signed)
 Sent januvia 50mg  daily to pharmacy.

## 2023-09-14 NOTE — Progress Notes (Signed)
   09/14/2023  Patient ID: Kenneth Mcbride, male   DOB: 19-Nov-1985, 38 y.o.   MRN: [id number]  Prescription for Januvia 50mg  sent to [facility] and manufacturer copy card obtained: BIN:  [id number] PCN:  Loyalty Grp:  [id number] ID:  [id number]  Contacted pharmacy to verify coverage and provide copay card information.  The mediation is being filled at their offsite facility, so they were not able to provide copay information until it arrives Monday.  Sending patient a MyChart message to inform medication will be available for pick up Monday and to schedule a follow-up visit in 4 weeks to check on tolerance/efficacy.  [name], PharmD, DPLA

## 2023-09-27 DIAGNOSIS — M4804 Spinal stenosis, thoracic region: Secondary | ICD-10-CM | POA: Diagnosis not present

## 2023-10-12 ENCOUNTER — Telehealth: Payer: Self-pay

## 2023-10-12 NOTE — Progress Notes (Signed)
   10/12/2023  Patient ID: Kenneth Mcbride, male   DOB: 1985-11-13, 38 y.o.   MRN: [id number]  Contacted [facility] to provide copay card information for Januvia 50mg , because it does not appear medication was filled/picked up.  Januvia copay card (new card information) BIN:  [id number] PCN:  Loyalty Grp:  [id number] ID:  [id number]  Medication is now going through for $12.  Attempted to contact patient to make him aware and schedule a follow-up visit in 2-4 weeks, but I was not able to reach him.  HIPAA compliant voicemail left with my direct phone number; and I am sending a MyChart message  [name], PharmD, DPLA

## 2023-10-30 ENCOUNTER — Encounter: Payer: Self-pay | Admitting: Sports Medicine

## 2023-11-07 ENCOUNTER — Other Ambulatory Visit: Payer: Self-pay

## 2023-11-07 DIAGNOSIS — E1165 Type 2 diabetes mellitus with hyperglycemia: Secondary | ICD-10-CM

## 2023-11-07 NOTE — Progress Notes (Signed)
   11/07/2023 Name: Kenneth Mcbride MRN: [id number] DOB: April 17, 1985  No chief complaint on file.  Kenneth Mcbride is a 38 y.o. year old male who presented for a telephone visit.   They were referred to the pharmacist by their PCP for assistance in managing diabetes.   Subjective:  Care Team: Primary Care Provider: Breeback, Jade L, PA-C ; Next Scheduled Visit: 9/22  Diabetes: Current medications: metformin  500mg  BID with meals, Januvia  50mg  daily -Patient previously using Mounjaro  in the past to help manage diabetes; but this medication (and other GLP1's) are not affordable at this time on patient's insurance -Patient initiated Januvia  50mg  daily approximately 3 weeks ago and endorses tolerating well with no adverse side effects -Patient has implemented lifestyle modifications to also help with control of diabetes -Last A1c elevated at 9.3% on 08/20/2023 up from 7.3% in March -Patient does monitor FBG daily and endorses a few readings <100 but these are mostly 120-150 per patient  Objective:  Lab Results  Component Value Date   HGBA1C 9.3 (A) 08/20/2023   Lab Results  Component Value Date   CREATININE 0.98 02/16/2023   BUN 15 02/16/2023   NA 141 02/16/2023   K 4.8 02/16/2023   CL 104 02/16/2023   CO2 23 02/16/2023   Assessment/Plan:   Diabetes: -Currently uncontrolled -Continue current regimen until follow-up with PCP on 9/22- patient will be due for A1c at that time.  If A1c remains >7%, I recommend increasing Januvia  to 100mg  daily -Continue to monitor FBG daily  Follow-up:  6 weeks  Kenneth Mcbride, PharmD, DPLA

## 2023-11-16 DIAGNOSIS — G8929 Other chronic pain: Secondary | ICD-10-CM | POA: Diagnosis not present

## 2023-11-16 DIAGNOSIS — M5441 Lumbago with sciatica, right side: Secondary | ICD-10-CM | POA: Diagnosis not present

## 2023-11-16 DIAGNOSIS — M5442 Lumbago with sciatica, left side: Secondary | ICD-10-CM | POA: Diagnosis not present

## 2023-11-19 ENCOUNTER — Ambulatory Visit (INDEPENDENT_AMBULATORY_CARE_PROVIDER_SITE_OTHER): Admitting: Physician Assistant

## 2023-11-19 VITALS — BP 137/90

## 2023-11-19 DIAGNOSIS — E785 Hyperlipidemia, unspecified: Secondary | ICD-10-CM | POA: Diagnosis not present

## 2023-11-19 DIAGNOSIS — Z7984 Long term (current) use of oral hypoglycemic drugs: Secondary | ICD-10-CM

## 2023-11-19 DIAGNOSIS — E1165 Type 2 diabetes mellitus with hyperglycemia: Secondary | ICD-10-CM

## 2023-11-19 DIAGNOSIS — M48062 Spinal stenosis, lumbar region with neurogenic claudication: Secondary | ICD-10-CM | POA: Diagnosis not present

## 2023-11-19 DIAGNOSIS — I10 Essential (primary) hypertension: Secondary | ICD-10-CM

## 2023-11-19 DIAGNOSIS — M4804 Spinal stenosis, thoracic region: Secondary | ICD-10-CM | POA: Insufficient documentation

## 2023-11-19 LAB — POCT GLYCOSYLATED HEMOGLOBIN (HGB A1C): Hemoglobin A1C: 7.6 % — AB (ref 4.0–5.6)

## 2023-11-19 MED ORDER — METFORMIN HCL 500 MG PO TABS: 1000.0000 mg | ORAL_TABLET | Freq: Two times a day (BID) | ORAL | 1 refills | Status: AC

## 2023-11-19 MED ORDER — LISINOPRIL 2.5 MG PO TABS: ORAL_TABLET | ORAL | 3 refills | Status: DC

## 2023-11-19 MED ORDER — SITAGLIPTIN PHOSPHATE 100 MG PO TABS: 100.0000 mg | ORAL_TABLET | Freq: Every day | ORAL | 0 refills | Status: DC

## 2023-11-19 NOTE — Progress Notes (Signed)
 Established Patient Office Visit  Subjective   Patient ID: Kenneth Mcbride, male    DOB: 30-Jan-1986  Age: 38 y.o. MRN: [id number]  Chief Complaint  Patient presents with   Medical Management of Chronic Issues    HPI Discussed the use of AI scribe software for clinical note transcription with the patient, who gave verbal consent to proceed.  History of Present Illness Kenneth Mcbride is a 38 year old male with chronic lumbar and thoracic stenosis who presents with worsening back pain and leg symptoms.  Lumbar radiculopathy and back pain - Chronic lumbar stenosis with recent exacerbation of back pain following a bowling incident a couple of weeks ago - Felt a 'pop' in the back during the incident - Increased difficulty standing straight after the incident, with some improvement since then - Pain radiates down both legs - Pain worsens with arching the back and improves with bending forward - Prescribed prednisone  and gabapentin  (gabapentin  not yet started) - Prednisone  increases appetite and causes excessive warmth - Uses Tylenol  for pain management -has appt this week with neurosurgeon  Glycemic control - Diabetes mellitus with recent improvement in A1c from 9.7 to 7.9 - Takes Januvia  and metformin  twice daily - Implementing dietary changes, including reducing soda intake and increasing water consumption - denies any hypoglycemic events  Hypertension - Hypertension managed with lisinopril  2.5 mg - Blood pressure has been slightly elevated - Does not regularly monitor blood pressure at home  Physical activity - Engages in physical activities such as bowling and using a Peloton bike     ROS See HPI.    Objective:     BP (!) 137/90 (BP Location: Right Arm, Patient Position: Sitting, Cuff Size: Normal)  BP Readings from Last 3 Encounters:  11/19/23 (!) 137/90  08/20/23 138/78  05/21/23 130/80   Wt Readings from Last 3 Encounters:  08/20/23 284 lb (128.8 kg)   05/21/23 282 lb (127.9 kg)  02/19/23 280 lb (127 kg)      Physical Exam Constitutional:      Appearance: Normal appearance. He is obese.  HENT:     Head: Normocephalic.  Cardiovascular:     Rate and Rhythm: Normal rate and regular rhythm.  Pulmonary:     Effort: Pulmonary effort is normal.     Breath sounds: Normal breath sounds.  Musculoskeletal:     Comments: Decreased extension of low back due to pain. Flexion at waist NROM.    Neurological:     General: No focal deficit present.     Mental Status: He is alert and oriented to person, place, and time.  Psychiatric:        Mood and Affect: Mood normal.      The 10-year ASCVD risk score (Arnett DK, et al., 2019) is: 3.6%    Assessment & Plan:  [name] was seen today for medical management of chronic issues.  Diagnoses and all orders for this visit:  Uncontrolled type 2 diabetes mellitus with hyperglycemia (HCC) -     POCT HgB A1C -     sitaGLIPtin  (JANUVIA ) 100 MG tablet; Take 1 tablet (100 mg total) by mouth daily. -     metFORMIN  (GLUCOPHAGE ) 500 MG tablet; Take 2 tablets (1,000 mg total) by mouth 2 (two) times daily with a meal.  Hypertension goal BP (blood pressure) < 130/80 -     lisinopril  (ZESTRIL ) 2.5 MG tablet; TAKE 1 TABLET(2.5 MG) BY MOUTH DAILY  Dyslipidemia  Spinal stenosis of lumbar region with neurogenic claudication  Thoracic spinal stenosis    .SABRAAssessment and Plan Assessment & Plan Lumbar spinal stenosis with recurrent symptoms Recurrent lumbar spinal stenosis with neurogenic claudication, exacerbated by physical activity. Ankylosing spondylitis ruled out. Managed by Dr. Joshua and Dr. Vonna, neurosurgeon.  - Continue prednisone  for acute symptoms until finished coarse.  - Start gabapentin  for nerve pain. - Follow up with neurosurgery team. - Avoid exacerbating activities. - Consider further surgery if symptoms worsen.  Type 2 diabetes mellitus with hyperglycemia Type 2 diabetes with  improved glycemic control. A1c reduced from 9.7% to 7.9%. Prednisone  may affect glucose levels. - Increase Januvia  to 100 mg daily. - increase metformin  1000 mg twice daily. - on crestor  - eye and foot exam UTD - declined flu and pneumonia vaccine - Re-evaluate A1c in three months.  Hypertension Hypertension with recent elevation, likely due to prednisone . Improved on recheck. - Continue lisinopril  2.5 mg daily. - Re-evaluate blood pressure in three months.     [name], PA-C

## 2023-11-22 DIAGNOSIS — M48062 Spinal stenosis, lumbar region with neurogenic claudication: Secondary | ICD-10-CM | POA: Diagnosis not present

## 2023-11-22 DIAGNOSIS — Z6838 Body mass index (BMI) 38.0-38.9, adult: Secondary | ICD-10-CM | POA: Diagnosis not present

## 2023-11-22 DIAGNOSIS — M4804 Spinal stenosis, thoracic region: Secondary | ICD-10-CM | POA: Diagnosis not present

## 2023-11-26 ENCOUNTER — Other Ambulatory Visit (HOSPITAL_BASED_OUTPATIENT_CLINIC_OR_DEPARTMENT_OTHER): Payer: Self-pay | Admitting: Student

## 2023-11-26 DIAGNOSIS — M48062 Spinal stenosis, lumbar region with neurogenic claudication: Secondary | ICD-10-CM

## 2023-12-04 ENCOUNTER — Ambulatory Visit (HOSPITAL_BASED_OUTPATIENT_CLINIC_OR_DEPARTMENT_OTHER)
Admission: RE | Admit: 2023-12-04 | Discharge: 2023-12-04 | Disposition: A | Discharge status: Home / Self Care | Source: Ambulatory Visit | Attending: Student | Admitting: Student

## 2023-12-04 DIAGNOSIS — M48062 Spinal stenosis, lumbar region with neurogenic claudication: Secondary | ICD-10-CM | POA: Insufficient documentation

## 2023-12-04 MED ORDER — GADOBUTROL 1 MMOL/ML IV SOLN
10.0000 mL | Freq: Once | INTRAVENOUS | Status: AC | PRN
  Administered 2023-12-04: 10 mL via INTRAVENOUS

## 2023-12-25 DIAGNOSIS — M48062 Spinal stenosis, lumbar region with neurogenic claudication: Secondary | ICD-10-CM | POA: Diagnosis not present

## 2023-12-25 DIAGNOSIS — Z6838 Body mass index (BMI) 38.0-38.9, adult: Secondary | ICD-10-CM | POA: Diagnosis not present

## 2023-12-26 ENCOUNTER — Telehealth: Payer: Self-pay | Admitting: Pharmacy Technician

## 2023-12-26 NOTE — Progress Notes (Signed)
   12/26/2023 Name: Kenneth Mcbride MRN: [id number] DOB: 25-Jul-1985  Patient is appearing for a follow-up visit with the population health pharmacy technician. Last engaged with the clinical pharmacist to discuss diabetes on 11/07/2023. Contacted patient today to discuss diabetes.   Plan from last clinical pharmacist appointment:  Diabetes: -Currently uncontrolled -Continue current regimen until follow-up with PCP on 9/22- patient will be due for A1c at that time.  If A1c remains >7%, I recommend increasing Januvia  to 100mg  daily -Continue to monitor FBG daily  Follow-up:  6 weeks   Medication Adherence Barriers Identified:  Patient made recommended medication changes per plan: Yes Patient informs he did increase Metformin  to 1000mg  twice a day and Januvia  to 100mg  daily. He reports he is tolerating the dose increases. Access issues with any new medication or testing device: No Patient reports having the medications on hand to take as prescribed and Dr Annemarie seems to confirm that information. Per fill history data from Dr Annemarie, 360 tablets of Metformin  were sold on 9/26 and 90 tablets of Januvia  on 9/26 as well. Patient is checking blood sugars as prescribed: No Patient is NOT checking blood sugar currently BUT plans to start checking it again. He informs he has had a lot going on with moving residences and having back issues to finding out another surgery is needed. He reports no symptoms of  hypoglycemia and reports only 1 episode of hyperglycemia (frequent urination) which occurred last evening. He informs his Prednisone  course stopped about a month ago. Last A1C was 7.6 on 9/22.   Medication Adherence Barriers Addressed/Actions Taken:  Reviewed medication changes per plan from last clinical pharmacist note Educated patient to contact pharmacy regarding new prescriptions Reviewed instructions for monitoring blood sugars at home and reminded patient to keep a written log to review with  pharmacist Reminded patient of date/time of upcoming clinical pharmacist follow up and any upcoming PCP/specialists visits. Patient denies transportation barriers to the appointment. Yes  Next clinical pharmacist appointment is scheduled for: 01/10/2024  Kate Caddy, CPhT [facility] Office: [phone] Email: [email or web address]

## 2024-01-09 NOTE — Progress Notes (Signed)
   01/10/2024 Name: Kenneth Mcbride MRN: [id number] DOB: 10/19/1985  Kenneth Mcbride is a 38 y.o. year old male who presented for a telephone visit.   They were referred to the pharmacist by their PCP for assistance in managing diabetes.   Subjective:  Care Team: Primary Care Provider: Breeback, Jade L, PA-C ; Next Scheduled Visit: 12/22 (physical)  Diabetes: Current medications: metformin  1000mg  BID with meals, Januvia  100mg  daily -Patient previously using Mounjaro  in the past to help manage diabetes; but this medication (and other GLP1's) are not affordable at this time on patient's insurance -Metformin  increased to 1000mg  BID and Januvia  to 100mg  daily approximately 8 weeks ago and patient endorses tolerating these medications well with no noted adverse side effects -Patient has also increased activity as able (spinal stenosis is a limiting factor) -Patient plans to have spinal fusion surgery in the upcoming weeks/months and knows he will be sedentary for a while, which usually causes his BG to increase. -Last A1c had improved to 7.6% from 9.3% -Patient has not been monitoring home BG, because they recently moved and he cannot locate his testing supplies -Does not endorse any s/sx of hypoglycemia or hyperglycemia -ACEi'/ARB for cardiorenal risk reduction:  lisinopril  2.5mg  daily - patient needs this medication refilled.  Last OV BP slightly elevated at 137/90 on 9/22. -UACR <30 on 08/20/23 -Statin for ASCVD risk reduction:  rosuvastatin - patient ran out of 20mg  tablets and has been taking 10mg  tablets he had at home for the past 2 weeks.  Last LDL elevated at 110, TG 162; this was 02/16/2023.  Objective:  Lab Results  Component Value Date   HGBA1C 7.6 (A) 11/19/2023   Lab Results  Component Value Date   CREATININE 0.98 02/16/2023   BUN 15 02/16/2023   NA 141 02/16/2023   K 4.8 02/16/2023   CL 104 02/16/2023   CO2 23 02/16/2023      Component Value Date/Time   CHOL 174  02/16/2023 0950   TRIG 162 (H) 02/16/2023 0950   HDL 35 (L) 02/16/2023 0950   CHOLHDL 5.0 02/16/2023 0950   CHOLHDL 7.4 (H) 02/07/2022 0824   LDLCALC 110 (H) 02/16/2023 0950   LDLCALC 179 (H) 02/07/2022 0824   LABVLDL 29 02/16/2023 0950    Assessment/Plan:   Diabetes: -A1c not at goal of <7%, but I expect improvement since metformin  and Januvia  were increased -Last UACR was at goal of <30; I would recommend a follow-up UACR at upcoming PCP visit -BP is not at goal of <130/80 based on OV readings.  Order pending for 1 refill of lisinopril  2.5mg  daily; but could consider increasing to 5mg  daily if BP remains elevated at upcoming PCP visit.  I would recommend patient monitor home BP if dose is increased since he has PMH of hypotension. -Last LDL not at goal of <70; I recommend a follow-up lipid panel at upcoming PCP visit.  If LDL >70, consider increasing rosuvastatin  to 40mg  daily.  Contacted [facility], and there is 1 more refill of 20mg  remaining- patient will contact new pharmacy to have them transfer and fill this. -Advised patient to let me know if he cannot locate home BG testing supplies, so I can see if insurance will cover new glucometer, test strips, and lancets.  If not, I can advise on cheapest OTC option.  Recommend to resume checking home BG regularly.  Follow-up:  2 months  [name], PharmD, DPLA

## 2024-01-10 ENCOUNTER — Other Ambulatory Visit: Payer: Self-pay

## 2024-01-10 DIAGNOSIS — E1165 Type 2 diabetes mellitus with hyperglycemia: Secondary | ICD-10-CM

## 2024-01-10 DIAGNOSIS — R030 Elevated blood-pressure reading, without diagnosis of hypertension: Secondary | ICD-10-CM

## 2024-01-10 DIAGNOSIS — I10 Essential (primary) hypertension: Secondary | ICD-10-CM

## 2024-01-10 MED ORDER — LISINOPRIL 2.5 MG PO TABS: ORAL_TABLET | ORAL | 0 refills | Status: DC

## 2024-02-06 DIAGNOSIS — Q762 Congenital spondylolisthesis: Secondary | ICD-10-CM | POA: Diagnosis not present

## 2024-02-06 DIAGNOSIS — Z9889 Other specified postprocedural states: Secondary | ICD-10-CM | POA: Diagnosis not present

## 2024-02-06 DIAGNOSIS — M48061 Spinal stenosis, lumbar region without neurogenic claudication: Secondary | ICD-10-CM | POA: Diagnosis not present

## 2024-02-06 DIAGNOSIS — M4804 Spinal stenosis, thoracic region: Secondary | ICD-10-CM | POA: Diagnosis not present

## 2024-02-18 ENCOUNTER — Ambulatory Visit: Admitting: Physician Assistant

## 2024-02-18 VITALS — BP 120/70 | HR 86 | Ht 71.0 in | Wt 282.0 lb

## 2024-02-18 DIAGNOSIS — E1169 Type 2 diabetes mellitus with other specified complication: Secondary | ICD-10-CM | POA: Diagnosis not present

## 2024-02-18 DIAGNOSIS — Z7984 Long term (current) use of oral hypoglycemic drugs: Secondary | ICD-10-CM | POA: Diagnosis not present

## 2024-02-18 DIAGNOSIS — E785 Hyperlipidemia, unspecified: Secondary | ICD-10-CM

## 2024-02-18 DIAGNOSIS — M48062 Spinal stenosis, lumbar region with neurogenic claudication: Secondary | ICD-10-CM

## 2024-02-18 DIAGNOSIS — I10 Essential (primary) hypertension: Secondary | ICD-10-CM

## 2024-02-18 DIAGNOSIS — Z0000 Encounter for general adult medical examination without abnormal findings: Secondary | ICD-10-CM

## 2024-02-18 LAB — POCT GLYCOSYLATED HEMOGLOBIN (HGB A1C): Hemoglobin A1C: 6.7 % — AB (ref 4.0–5.6)

## 2024-02-18 LAB — POCT UA - MICROALBUMIN
Albumin/Creatinine Ratio, Urine, POC: <30
Creatinine, POC: 200 mg/dL
Microalbumin Ur, POC: 10 mg/L

## 2024-02-18 MED ORDER — LISINOPRIL 2.5 MG PO TABS: ORAL_TABLET | ORAL | 1 refills | Status: AC

## 2024-02-18 MED ORDER — SITAGLIPTIN PHOSPHATE 100 MG PO TABS: 100.0000 mg | ORAL_TABLET | Freq: Every day | ORAL | 1 refills | Status: DC

## 2024-02-18 NOTE — Progress Notes (Unsigned)
 "  Complete physical exam  Patient: Kenneth Mcbride   DOB: October 15, 1985   38 y.o. Male  MRN: [id number]  Subjective:    Chief Complaint  Patient presents with   Annual Exam    Patient states he is having back surgery on Friday 02/22/2024 and has had some pre-op lab work done with an outside office . Requesting rx rf of Lisinopril  2.5mg   And Januvia .   .Discussed the use of AI scribe software for clinical note transcription with the patient, who gave verbal consent to proceed.  History of Present Illness Kenneth Mcbride is a 38 year old male who presents to the clinic for annual exam and medication follow up.   Lumbar radiculopathy and pre-operative status - Scheduled for decompression surgery on Friday involving L4, L5, and L1, L2 levels. - History of prior decompression at L4, L5. - Significant pain limits physical activity; after moving in October, unable to move for two days due to pain. - Able to ambulate despite pain. - Apprehensive about potential pain with future procedures involving screws and rods. - No swelling, chest pain, or shortness of breath. - Not exercising due to pain.  Diabetes mellitus management - Diabetes managed with metformin  and Januvia . - Requires refill for Januvia . - Recent A1c measured at 6.7. - Recent elevated blood sugar attributed to eating prior to test. - Kidney function and calcium  levels recently checked.  Hypertension and renal protection - Takes lisinopril ; ran out of medication on Friday, resulting in headache on Saturday. - Concerned about maintaining blood pressure and kidney protection due to diabetes.  Laboratory evaluation - No recent cholesterol test performed. - Fasting today for potential cholesterol testing.    Most recent fall risk assessment:    02/18/2024    8:45 AM  Fall Risk   Falls in the past year? 1  Number falls in past yr: 0  Injury with Fall? 0  Risk for fall due to : History of fall(s)  Follow up Falls evaluation  completed     Most recent depression screenings:    02/18/2024    8:35 AM 08/20/2023    8:49 AM  PHQ 2/9 Scores  PHQ - 2 Score 0 0  PHQ- 9 Score 2     Vision:Within last year and Dental: No current dental problems and Receives regular dental care  Patient Active Problem List   Diagnosis Date Noted   Thoracic spinal stenosis 11/19/2023   Uncontrolled type 2 diabetes mellitus with hyperglycemia (HCC) 05/22/2023   Drug-induced constipation 02/19/2023   Gastroesophageal reflux disease without esophagitis 11/17/2022   Bilateral leg cramps 05/16/2022   Thoracic spondylosis with myelopathy 03/10/2022   Hypertension goal BP (blood pressure) < 130/80 02/10/2022   Fracture of rib of right side 10/04/2021   Orthostatic hypotension 10/04/2021   Acute right-sided low back pain with right-sided sciatica 07/14/2021   Neck pain 07/14/2021   Sinus headache 06/22/2021   Allergic sinusitis 06/22/2021   Seasonal allergic rhinitis due to pollen 06/22/2021   ADHD (attention deficit hyperactivity disorder), combined type 06/23/2020   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 06/23/2020   GAD (generalized anxiety disorder) 06/23/2020   Change in consistency of stool 05/03/2020   Skin tag 05/03/2020   Depressed mood 01/14/2020   Irritable 01/14/2020   Non-restorative sleep 01/14/2020   Snoring 01/14/2020   Poor concentration 01/14/2020   Elevated blood pressure reading without diagnosis of hypertension 12/26/2019   Acute nonintractable headache 12/26/2019   Dizziness 12/26/2019   Anxiety  12/01/2019   Dyslipidemia 10/28/2019   Injury of left ring finger 09/22/2019   Left fourth distal phalangeal exostosis 09/22/2019   Spinal stenosis 08/27/2019   Class 2 severe obesity due to excess calories with serious comorbidity and body mass index (BMI) of 39.0 to 39.9 in adult 08/12/2019   Diabetes mellitus type II, controlled (HCC) 08/12/2019   Lumbar spinal stenosis 05/20/2019   Tendinitis  of left hand 01/16/2014   Past Medical History:  Diagnosis Date   ADHD (attention deficit hyperactivity disorder)    ADHD   Anxiety    Arthritis    COVID    October 2021, February 2022 - 1st one was severe per pt. and 2nd one was mild   Depression    Diabetes mellitus without complication (HCC)    Family history of adverse reaction to anesthesia    father and grand father both heart stopped during surgery   Fatty liver    Headache    Hypertension    No pertinent past medical history    Spinal stenosis    Past Surgical History:  Procedure Laterality Date   LUMBAR LAMINECTOMY/DECOMPRESSION MICRODISCECTOMY N/A 08/27/2019   Procedure: LUMBAR 2-LUMBAR 5 DECOMPRESSION;  Surgeon: Beuford Anes, MD;  Location: [facility] OR;  Service: Orthopedics;  Laterality: N/A;   LUMBAR LAMINECTOMY/DECOMPRESSION MICRODISCECTOMY N/A 03/10/2022   Procedure: Laminectomy and Foraminotomy - Thoracic Seven - Thoracic Twelve;  Surgeon: Joshua Alm RAMAN, MD;  Location: [facility];  Service: Neurosurgery;  Laterality: N/A;   Family History  Problem Relation Age of Onset   Diabetes Father    Heart disease Father    Diabetes Paternal Grandfather    COPD Paternal Grandfather    Allergies[1]    Patient Care Team: Antoniette Vermell LITTIE DEVONNA as PCP - General (Family Medicine) Abigail, Maude POUR [facility])   Show/hide medication list[2]  ROS        Objective:     BP 120/70   Pulse 86   Ht 5' 11 (1.803 m)   Wt 282 lb (127.9 kg)   SpO2 99%   BMI 39.33 kg/m  BP Readings from Last 3 Encounters:  02/18/24 120/70  11/19/23 (!) 137/90  08/20/23 138/78   Wt Readings from Last 3 Encounters:  02/18/24 282 lb (127.9 kg)  08/20/23 284 lb (128.8 kg)  05/21/23 282 lb (127.9 kg)      Physical Exam  BP 120/70   Pulse 86   Ht 5' 11 (1.803 m)   Wt 282 lb (127.9 kg)   SpO2 99%   BMI 39.33 kg/m   General Appearance:    Alert, cooperative, obese, no distress, appears stated age  Head:    Normocephalic, without  obvious abnormality, atraumatic  Eyes:    PERRL, conjunctiva/corneas clear, EOM's intact, fundi    benign, both eyes       Ears:    Normal TM's and external ear canals, both ears  Nose:   Nares normal, septum midline, mucosa normal, no drainage    or sinus tenderness  Throat:   Lips, mucosa, and tongue normal; teeth and gums normal  Neck:   Supple, symmetrical, trachea midline, no adenopathy;       thyroid :  No enlargement/tenderness/nodules; no carotid   bruit or JVD  Back:     Symmetric, no curvature, ROM normal, no CVA tenderness  Lungs:     Clear to auscultation bilaterally, respirations unlabored  Chest wall:    No tenderness or deformity  Heart:    Regular  rate and rhythm, S1 and S2 normal, no murmur, rub   or gallop  Abdomen:     Soft, non-tender, bowel sounds active all four quadrants,    no masses, no organomegaly        Extremities:   Extremities normal, atraumatic, no cyanosis or edema  Pulses:   2+ and symmetric all extremities  Skin:   Skin color, texture, turgor normal, no rashes or lesions  Lymph nodes:   Cervical, supraclavicular, and axillary nodes normal  Neurologic:   CNII-XII intact. Normal strength, sensation and reflexes      throughout    Results for orders placed or performed in visit on 02/18/24  POCT HgB A1C  Result Value Ref Range   Hemoglobin A1C 6.7 (A) 4.0 - 5.6 %   HbA1c POC (<> result, manual entry)     HbA1c, POC (prediabetic range)     HbA1c, POC (controlled diabetic range)    POCT UA - Microalbumin  Result Value Ref Range   Microalbumin Ur, POC 10 mg/L   Creatinine, POC 200 mg/dL   Albumin /Creatinine Ratio, Urine, POC <30        Assessment & Plan:    Routine Health Maintenance and Physical Exam   There is no immunization history on file for this patient.  Health Maintenance  Topic Date Due   Diabetic kidney evaluation - eGFR measurement  02/16/2024   Pneumococcal Vaccine (1 of 2 - PCV) 05/21/2024 (Originally 08/10/2002)   Influenza  Vaccine  05/27/2024 (Originally 09/28/2023)   HPV VACCINES (1 - Risk 3-dose SCDM series) 08/19/2024 (Originally 08/10/2010)   Hepatitis B Vaccines 19-59 Average Risk (1 of 3 - 19+ 3-dose series) 02/17/2025 (Originally 08/10/2002)   FOOT EXAM  05/20/2024   OPHTHALMOLOGY EXAM  05/22/2024   HEMOGLOBIN A1C  08/18/2024   Diabetic kidney evaluation - Urine ACR  02/17/2025   Hepatitis C Screening  Completed   HIV Screening  Completed   Meningococcal B Vaccine  Aged Out   DTaP/Tdap/Td  Discontinued   COVID-19 Vaccine  Discontinued    Discussed health benefits of physical activity, and encouraged him to engage in regular exercise appropriate for his age and condition. Kenneth Mcbride was seen today for annual exam.  Diagnoses and all orders for this visit:  Routine physical examination -     Lipid panel  Hyperlipidemia associated with type 2 diabetes mellitus (HCC) -     Lipid panel -     POCT HgB A1C -     POCT UA - Microalbumin -     sitaGLIPtin  (JANUVIA ) 100 MG tablet; Take 1 tablet (100 mg total) by mouth daily.  Hypertension goal BP (blood pressure) < 130/80 -     lisinopril  (ZESTRIL ) 2.5 MG tablet; TAKE 1 TABLET(2.5 MG) BY MOUTH DAILY  Dyslipidemia -     Lipid panel  Spinal stenosis of lumbar region with neurogenic claudication    Assessment & Plan General Health - Fasting labs ordered today. - PHQ no concerns.  - hopefully can increase exercise post surgical repair and healing - Continue to keep healthy diet and in the next year will work on weight loss - Pt declined all vaccines  Preoperative evaluation for lumbar spine decompression surgery Scheduled for revision decompression at L4-L5 and L1-L2 due to inadequate initial decompression. Prefers less invasive approach due to concerns about metal implants and potential future limitations. - Proceed with scheduled lumbar spine decompression surgery. - Ensure postoperative mobilization as advised by surgeon.  Type 2 diabetes  mellitus associated with hyperlipidemia A1c improved to 6.7% from previous 7.6%. Current medications include Metformin  and Januvia . No need to stop medications prior to surgery. - Refilled Januvia  and Metformin  prescription. - Foot and eye exam UTD - Normal microalbumin - BP to goal - Pt declined flu, covid and pneumonia vaccine  Hypertension Blood pressure management with Lisinopril . Ran out of Lisinopril , leading to headache and possible blood pressure elevation. Lisinopril  also used for renal protection due to diabetes. - Refilled Lisinopril  prescription.  Dyslipidemia Cholesterol levels not recently checked. Long-term goal to maintain LDL under 70 mg/dL. - Ordered cholesterol test.    Return in about 3 months (around 05/18/2024).     Kyrene Longan, PA-C      [1]  Allergies Allergen Reactions   Buspar [Buspirone]     Made anxiety worse.   [2]  Outpatient Medications Prior to Visit  Medication Sig   albuterol  (VENTOLIN  HFA) 108 (90 Base) MCG/ACT inhaler Inhale 2 puffs into the lungs every 6 (six) hours as needed.   AMBULATORY NON FORMULARY MEDICATION Glucometer lancets, strips to test blood sugar once a day for T2DM.   fluticasone  (FLONASE ) 50 MCG/ACT nasal spray Place 2 sprays into both nostrils daily.   ibuprofen  (ADVIL ) 800 MG tablet Take 1 tablet (800 mg total) by mouth every 8 (eight) hours as needed.   metFORMIN  (GLUCOPHAGE ) 500 MG tablet Take 2 tablets (1,000 mg total) by mouth 2 (two) times daily with a meal.   Multiple Vitamins-Minerals (MULTIVITAMIN WITH MINERALS) tablet Take 1 tablet by mouth daily.   omeprazole  (PRILOSEC) 40 MG capsule Take 1 capsule (40 mg total) by mouth daily.   rosuvastatin  (CRESTOR ) 20 MG tablet Take 1 tablet (20 mg total) by mouth daily.   SUMAtriptan  (IMITREX ) 50 MG tablet Take 1 tab (50 mg) by mouth for migraine headache. May repeat dose one time if headache has not subsided in 2 hours. Do not take more than 2 tabs (100 mg) in a 24 hour  period.   [DISCONTINUED] lisinopril  (ZESTRIL ) 2.5 MG tablet TAKE 1 TABLET(2.5 MG) BY MOUTH DAILY   [DISCONTINUED] sitaGLIPtin  (JANUVIA ) 100 MG tablet Take 1 tablet (100 mg total) by mouth daily.   No facility-administered medications prior to visit.   "

## 2024-02-18 NOTE — Patient Instructions (Signed)
 Health Maintenance, Male  Adopting a healthy lifestyle and getting preventive care are important in promoting health and wellness. Ask your health care provider about:  The right schedule for you to have regular tests and exams.  Things you can do on your own to prevent diseases and keep yourself healthy.  What should I know about diet, weight, and exercise?  Eat a healthy diet    Eat a diet that includes plenty of vegetables, fruits, low-fat dairy products, and lean protein.  Do not eat a lot of foods that are high in solid fats, added sugars, or sodium.  Maintain a healthy weight  Body mass index (BMI) is a measurement that can be used to identify possible weight problems. It estimates body fat based on height and weight. Your health care provider can help determine your BMI and help you achieve or maintain a healthy weight.  Get regular exercise  Get regular exercise. This is one of the most important things you can do for your health. Most adults should:  Exercise for at least 150 minutes each week. The exercise should increase your heart rate and make you sweat (moderate-intensity exercise).  Do strengthening exercises at least twice a week. This is in addition to the moderate-intensity exercise.  Spend less time sitting. Even light physical activity can be beneficial.  Watch cholesterol and blood lipids  Have your blood tested for lipids and cholesterol at 40 years of age, then have this test every 5 years.  You may need to have your cholesterol levels checked more often if:  Your lipid or cholesterol levels are high.  You are older than 40 years of age.  You are at high risk for heart disease.  What should I know about cancer screening?  Many types of cancers can be detected early and may often be prevented. Depending on your health history and family history, you may need to have cancer screening at various ages. This may include screening for:  Colorectal cancer.  Prostate cancer.  Skin cancer.  Lung  cancer.  What should I know about heart disease, diabetes, and high blood pressure?  Blood pressure and heart disease  High blood pressure causes heart disease and increases the risk of stroke. This is more likely to develop in people who have high blood pressure readings or are overweight.  Talk with your health care provider about your target blood pressure readings.  Have your blood pressure checked:  Every 3-5 years if you are 24-52 years of age.  Every year if you are 3 years old or older.  If you are between the ages of 60 and 72 and are a current or former smoker, ask your health care provider if you should have a one-time screening for abdominal aortic aneurysm (AAA).  Diabetes  Have regular diabetes screenings. This checks your fasting blood sugar level. Have the screening done:  Once every three years after age 66 if you are at a normal weight and have a low risk for diabetes.  More often and at a younger age if you are overweight or have a high risk for diabetes.  What should I know about preventing infection?  Hepatitis B  If you have a higher risk for hepatitis B, you should be screened for this virus. Talk with your health care provider to find out if you are at risk for hepatitis B infection.  Hepatitis C  Blood testing is recommended for:  Everyone born from [birth year] through 1967.  Anyone  with known risk factors for hepatitis C.  Sexually transmitted infections (STIs)  You should be screened each year for STIs, including gonorrhea and chlamydia, if:  You are sexually active and are younger than 40 years of age.  You are older than 40 years of age and your health care provider tells you that you are at risk for this type of infection.  Your sexual activity has changed since you were last screened, and you are at increased risk for chlamydia or gonorrhea. Ask your health care provider if you are at risk.  Ask your health care provider about whether you are at high risk for HIV. Your health care provider  may recommend a prescription medicine to help prevent HIV infection. If you choose to take medicine to prevent HIV, you should first get tested for HIV. You should then be tested every 3 months for as long as you are taking the medicine.  Follow these instructions at home:  Alcohol use  Do not drink alcohol if your health care provider tells you not to drink.  If you drink alcohol:  Limit how much you have to 0-2 drinks a day.  Know how much alcohol is in your drink. In the [place] one drink equals one 12 oz bottle of beer (355 mL), one 5 oz glass of wine (148 mL), or one 1 oz glass of hard liquor (44 mL).  Lifestyle  Do not use any products that contain nicotine or tobacco. These products include cigarettes, chewing tobacco, and vaping devices, such as e-cigarettes. If you need help quitting, ask your health care provider.  Do not use street drugs.  Do not share needles.  Ask your health care provider for help if you need support or information about quitting drugs.  General instructions  Schedule regular health, dental, and eye exams.  Stay current with your vaccines.  Tell your health care provider if:  You often feel depressed.  You have ever been abused or do not feel safe at home.  Summary  Adopting a healthy lifestyle and getting preventive care are important in promoting health and wellness.  Follow your health care provider's instructions about healthy diet, exercising, and getting tested or screened for diseases.  Follow your health care provider's instructions on monitoring your cholesterol and blood pressure.  This information is not intended to replace advice given to you by your health care provider. Make sure you discuss any questions you have with your health care provider.  Document Revised: 07/05/2020 Document Reviewed: 07/05/2020  Elsevier Patient Education  2024 [employer].

## 2024-02-19 ENCOUNTER — Encounter: Payer: Self-pay | Admitting: Physician Assistant

## 2024-02-19 ENCOUNTER — Ambulatory Visit: Payer: Self-pay | Admitting: Physician Assistant

## 2024-02-19 LAB — LIPID PANEL
Chol/HDL Ratio: 4.8 ratio (ref 0.0–5.0)
Cholesterol, Total: 164 mg/dL (ref 100–199)
HDL: 34 mg/dL — ABNORMAL LOW
LDL Chol Calc (NIH): 97 mg/dL (ref 0–99)
Triglycerides: 188 mg/dL — ABNORMAL HIGH (ref 0–149)
VLDL Cholesterol Cal: 33 mg/dL (ref 5–40)

## 2024-02-19 MED ORDER — ROSUVASTATIN CALCIUM 40 MG PO TABS: 40.0000 mg | ORAL_TABLET | Freq: Every day | ORAL | 3 refills | Status: AC

## 2024-02-19 NOTE — Progress Notes (Signed)
 Jacoby,   TG up some but as A1C declines TG will likely get to goal.  LDL under 100 which is good but your goal is under 70.  If taking crestor . I would like to increase to 40mg  daily. Thoughts?

## 2024-02-27 ENCOUNTER — Other Ambulatory Visit: Payer: Self-pay | Admitting: Physician Assistant

## 2024-02-27 DIAGNOSIS — E1169 Type 2 diabetes mellitus with other specified complication: Secondary | ICD-10-CM

## 2024-03-09 NOTE — Progress Notes (Unsigned)
" ° °  01/10/2024 Name: Kenneth Mcbride MRN: [id number] DOB: 03/05/1985  Kenneth Mcbride is a 39 y.o. year old male who presented for a telephone visit.   They were referred to the pharmacist by their PCP for assistance in managing diabetes.   Subjective:  Care Team: Primary Care Provider: Breeback, Jade L, PA-C ; Next Scheduled Visit: 12/22 (physical)  Diabetes: Current medications: metformin  1000mg  BID with meals, Januvia  100mg  daily -Patient previously using Mounjaro  in the past to help manage diabetes; but this medication (and other GLP1's) are not affordable at this time on patient's insurance -Metformin  increased to 1000mg  BID and Januvia  to 100mg  daily approximately 8 weeks ago and patient endorses tolerating these medications well with no noted adverse side effects -Patient has also increased activity as able (spinal stenosis is a limiting factor) -Patient plans to have spinal fusion surgery in the upcoming weeks/months and knows he will be sedentary for a while, which usually causes his BG to increase. -Last A1c had improved to 7.6% from 9.3% -Patient has not been monitoring home BG, because they recently moved and he cannot locate his testing supplies -Does not endorse any s/sx of hypoglycemia or hyperglycemia -ACEi'/ARB for cardiorenal risk reduction:  lisinopril  2.5mg  daily - patient needs this medication refilled.  Last OV BP slightly elevated at 137/90 on 9/22. -UACR <30 on 08/20/23 -Statin for ASCVD risk reduction:  rosuvastatin - patient ran out of 20mg  tablets and has been taking 10mg  tablets he had at home for the past 2 weeks.  Last LDL elevated at 110, TG 162; this was 02/16/2023.  Objective:  Lab Results  Component Value Date   HGBA1C 6.7 (A) 02/18/2024   Lab Results  Component Value Date   CREATININE 0.98 02/16/2023   BUN 15 02/16/2023   NA 141 02/16/2023   K 4.8 02/16/2023   CL 104 02/16/2023   CO2 23 02/16/2023      Component Value Date/Time   CHOL 164  02/18/2024 0859   TRIG 188 (H) 02/18/2024 0859   HDL 34 (L) 02/18/2024 0859   CHOLHDL 4.8 02/18/2024 0859   CHOLHDL 7.4 (H) 02/07/2022 0824   LDLCALC 97 02/18/2024 0859   LDLCALC 179 (H) 02/07/2022 0824   LABVLDL 33 02/18/2024 0859    Assessment/Plan:   Diabetes: -A1c not at goal of <7%, but I expect improvement since metformin  and Januvia  were increased -Last UACR was at goal of <30; I would recommend a follow-up UACR at upcoming PCP visit -BP is not at goal of <130/80 based on OV readings.  Order pending for 1 refill of lisinopril  2.5mg  daily; but could consider increasing to 5mg  daily if BP remains elevated at upcoming PCP visit.  I would recommend patient monitor home BP if dose is increased since he has PMH of hypotension. -Last LDL not at goal of <70; I recommend a follow-up lipid panel at upcoming PCP visit.  If LDL >70, consider increasing rosuvastatin  to 40mg  daily.  Contacted [facility], and there is 1 more refill of 20mg  remaining- patient will contact new pharmacy to have them transfer and fill this. -Advised patient to let me know if he cannot locate home BG testing supplies, so I can see if insurance will cover new glucometer, test strips, and lancets.  If not, I can advise on cheapest OTC option.  Recommend to resume checking home BG regularly.  Follow-up:  2 months  [name], PharmD, DPLA    "

## 2024-03-10 ENCOUNTER — Other Ambulatory Visit: Payer: Self-pay

## 2024-03-10 DIAGNOSIS — E1165 Type 2 diabetes mellitus with hyperglycemia: Secondary | ICD-10-CM

## 2024-03-10 DIAGNOSIS — I10 Essential (primary) hypertension: Secondary | ICD-10-CM

## 2024-03-10 DIAGNOSIS — E785 Hyperlipidemia, unspecified: Secondary | ICD-10-CM

## 2024-03-20 NOTE — Progress Notes (Signed)
" ° ° °  Division of Pharmacy Services  Medication History Completion Note  Name/DOB/Age of Patient: Kenneth Mcbride / November 13, 1985 / 39 y.o.  Location: [facility] 05 DH  Type: Hospital admission & Modality: Phone  Confirmed two patient identifiers: Yes  Confirmed patient is alert & oriented: Yes  Medication History Source (Med History Informants):  Patient Family Member   Which family member responsible for patient's medication management/administration at home? (enter name/phone number): Spouse Rodena ph# [phone])  Dispense History   Asked about any missing medications (such as pumps, injectable meds, TPN, OTC, etc): Yes  PTA Med List:  Prior to Admission Medications     Reviewed by Lorn Nephew, CPhT on 03/20/24 at 1439    Medication Sig Last Dose Informant Taking? Status  albuterol  HFA (PROVENTIL  HFA;VENTOLIN  HFA;PROAIR  HFA) 90 mcg/actuation inhaler Inhale 2 puffs every 6 (six) hours as needed for wheezing or shortness of breath. More than a month Other, SPOUSE No Active         Med Note Kenneth Mcbride   Thu Mar 20, 2024  2:36 PM)    lisinopriL  (PRINIVIL ) 2.5 mg tablet Take 2.5 mg by mouth daily. 03/19/2024 Other, SPOUSE Yes Active  metFORMIN  (GLUCOPHAGE ) 500 mg tablet Take 1,000 mg by mouth 2 (two) times a day. with a meal 03/19/2024 Other, SPOUSE Yes Active  multivit,cal,mn-folic-D3-lycop (One A Day Men Complete) 240-25-300 mcg tab Take 1 tablet by mouth daily. Past Week SPOUSE Yes Active  rosuvastatin  (CRESTOR ) 40 mg tablet Take 40 mg by mouth daily. 03/19/2024 SPOUSE, Other Yes Active  SITagliptin  phosphate (JANUVIA ) 50 mg tablet Take 50 mg by mouth daily. 03/19/2024 Other, SPOUSE Yes Active  SUMAtriptan  (Imitrex ) 50 mg tablet Take 25 mg by mouth as needed for migraine. More than a month Other, SPOUSE No Active           Audit from Redirected Encounters   **Prior to Admission medications have not yet been reviewed for this encounter**     Selected Pharmacy:  [facility]  Comments:  All medications and allergies were verified by Spouse who is a pretty good historian and last fills/doses were verified by [facility].    Ibuprofen  800mg  - no longer taking Omeprazole  20mg  cap-no longer taking Crestor  20mg -increaseed to 40mg  qd DPS enrolled for delivery. Please call [phone] with questions for patients bedded in [facility] and [phone] for all other locations.  Electronically signed by: Lorn Nephew, CPhT 03/20/2024 2:39 PM   Medications reconciled by provider: No   Signature/Co-signature, if required: Lorn Nephew, CPhT   Date/Time: 03/20/2024 2:41 PM  "

## 2024-03-21 NOTE — Discharge Summary (Signed)
 NEUROSURGERY DISCHARGE SUMMARY  Patient ID: Kenneth Mcbride [id number] 39 y.o. [birth year]  Admit date: 03/20/2024 Discharge date: Fri 03/21/2024 Physician: No att. 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 He presented with persistent symptoms including numbness in the right foot, and his surgical history and physiologic condition were reviewed and found to be stable since his last evaluation. The neurosurgery team determined that he was ready for L4-5 and L1-2 laminectomies, and the plan was to proceed with open lumbar laminectomy at these levels.  He was taken to the operating room for the above noted procedure on 1/22. He tolerated the procedure well without complications. He was transferred to the neurosurgical floor postoperatively. There he progressed well. He met surgical milestones appropriately.   During the admission, his medication history was reviewed and reconciled, and it was noted that ibuprofen  and omeprazole  were discontinued prior to admission. His secondary health issues included peripheral neuropathy, reactive airway disease, GERD, and a history of annual bronchitis, all of which were stable and did not require acute intervention during this hospitalization. On the day of discharge he reports adequate pain control with an oral pain medication regimen, is ambulating independently and tolerating house diet without difficulty. He and family are amenable to the plan for discharge.       Significant Diagnostic Studies:  Intraoperative fluoroscopic guidance  Significant Diagnoses Managed during Hospitalization: - GERD: Pepcid 20mg  - HTN: monitoring of blood pressure  Labs: Lab Results  Component Value Date   WBC 8.80 02/13/2024   HGB 14.9 02/13/2024   HCT 43.8 02/13/2024   MCV 81.0 02/13/2024   PLT 220 02/13/2024    Discharge Exam: Vital Signs:  Vitals:   03/21/24 1428  BP: 137/71  Pulse: 105  Resp: 16  Temp: 98.4 F (36.9 C)  SpO2: 100%   Exam: Alert, cooperative, NAD Moves arms and legs well Full strength in legs throughout Sensation intact to  light touch Incisions c/d/I with Dermabond  Discharge Medication List:   Medication List     START taking these medications    HYDROcodone -acetaminophen  5-325 mg per tablet Commonly known as: NORCO Take 1 to 2 tablets by mouth every 6 (six) hours as needed (1 tablet for moderate; 2 tablets for severe POST OP pain). Max dose of Acetaminophen  daily is 4,000 mg. DO NOT Exceed 4,000 mg of acetaminophen  daily. (Take 1-2 tablets by mouth every 6 (six) hours as needed (1 tablet for moderate; 2 tablets for severe POST OP pain). Max dose of Acetaminophen  daily is 4,000mg . DO NOT Exceed 4,000mg  of acetaminophen  daily)   methocarbamol  750 mg tablet Commonly known as: ROBAXIN  Take 1 tablet (750 mg total) by mouth 4 (four) times a day as needed for muscle spasms.   polyethylene glycol 17 gram packet Commonly known as: GLYCOLAX Take 17 g by mouth daily for 7 days. Use to prevent constipation while taking narcotic pain medication.       CONTINUE taking these medications    albuterol  HFA 90 mcg/actuation inhaler Commonly known as: PROVENTIL  HFA;VENTOLIN  HFA;PROAIR  HFA Inhale 2 puffs every 6 (six) hours as needed for  wheezing or shortness of breath.   Imitrex  50 mg tablet Generic drug: SUMAtriptan  Take 25 mg by mouth as needed for migraine.   lisinopril  2.5 mg tablet Commonly known as: PRINIVIL  Take 2.5  mg by mouth daily.   metFORMIN  500 mg tablet Commonly known as: GLUCOPHAGE  Take 1,000 mg by mouth 2 (two) times a day. with a meal   One A Day Men Complete 240-25-300 mcg Tab Generic drug: multivit,cal,mn-folic-D3-lycop Take 1 tablet by mouth daily.   rosuvastatin  40 mg tablet Commonly known as: CRESTOR  Take 40 mg by mouth daily.   SITagliptin  phosphate 50 mg tablet Commonly known as: JANUVIA  Take 50 mg by mouth daily.         Where to Get Your Medications     These medications were sent to [facility] [address] [place] [address]    Hours: Open Monday 12am to Friday 11:59pm; Sat-Sun: Closed; Holidays: Closed Thanksgiving Phone: [phone]  HYDROcodone -acetaminophen  5-325 mg per tablet methocarbamol  750 mg tablet    You can get these medications from any pharmacy   You don't need a prescription for these medications polyethylene glycol 17 gram packet     Disposition:  home  Patient Instructions:  Discharge Orders     Activity Instructions:     Details:    Activity Limits:  No biking, ATVs, or contact sports No climbing or ladders No heavy machinery use No hunting or gun use See Patient Instructions     Patient Instructions: No strenuous activity   Comments: It is important to allow your body time to heal after surgery.  Avoid :Lifting anything greater than 10 pounds, repetitive bending, lifting, and twisting. Household chores such as vacuuming, mopping, laundry, and cleaning dishes which can irritate your back/neck ask for help with these tasks or perform them for short periods of time.  Do not put lotions, creams or ointments on the incision.  We encourage you to walk. This will help to keep your joints moving and your body conditioned. Start with walking 10-15 minutes several times per day. Increase your speed and duration of exercise gradually. Make sure to wear comfortable shoes and avoid uneven ground.  Resting is  important; however avoid napping too much throughout the day. If you get too much sleep, this may cause trouble sleeping at night. Immobility can also increase your chance of developing blood clots in your legs (DVT's-deep vein thrombosis) It is common to have trouble with pain and muscle spasm during this time period, the above recommendations, along with appropriate medication management, will help you to be more comfortable as you recover.    Refrain from smoking as it may slow bone healing. DO NOT MIX pain medication or muscle relaxants with alcohol. DO NOT operate any kind of machinery if taking pain medication.   Discharge Instructions - Bathing-Showering     Details:    Tub Bath or Shower Instructions: Do not soak in tub   Comments: Your surgical incision is closed with a surgical glue called Dermabond.  You may shower the day after surgery, but do not scrub. When finished showering pat incision dry. Do not submerge/soak your incision in water ie bath tub, swimming pool etc until after your follow up visit.   Discharge instructions - Other (specify)     Comments: Call Neurosurgery Clinic at [phone] for any of the following:  -Fever greater than 101.5, redness, swelling or drainage at your incision site -New or worsening pain, loss of  function, new numbness or tingling. -Unable to urinate or have a bowel movement.  -Any other question or concern that you may have or if you did not receive a call back for a follow up appointment information.   Driving Limits:     Comments: No driving while on narcotic pain medications or until released at your follow up visit, around 2 weeks.   Full Code     Lifting Limits:     Details:    Lifting Limits: Do NOT lift more than 10 lbs for:   Lifting Limit Duration: 4 weeks   Comments: No bending, lifting, or twisting.   Return to previous diet     Details:    Diet type: Return to previous diet   Wound or Incision Care Instructions     Details:     Wound or Incision Site 1: Back   Wound or Incision Care Instruction Site 1:  Bruising is normal Call doctor if incision is red or drains No creams, ointments, or lotions No powders or deodorants         See After Visit Summary for procedure specific home care instructions. Call for fever greater than 101.5, redness, swelling or drainage at your incision site, new or worsening pain, loss of function, new numbness or tingling. Call for any other question or concern.  The number to call is [phone].  Do not bend, lift, drive, move furniture or perform strenuous activity cleared at your follow up appointment. Never combine controlled substance medications with alcohol.  Never drive while taking controlled substance medications.    Follow-up with: An appointment with Dr. Ferne team in 2 weeks has been requested for you. You will be notified of the date and time of this appointment. If you do not receive an appointment please call [phone].  Your scheduled appointments are listed below. If you have any questions please call [phone].  Scheduled Future Appointments       Provider Department Dept Phone Center   04/04/2024 10:00 AM April Burnette Freund [facility] [place] [phone] [facility] [address]   04/17/2024 8:00 AM Harlene SHAUNNA Pais [facility] [place] [phone] [facility] [address] [facility]   06/11/2024 10:30 AM Lonni Gwenn Favorite [facility] [place] [phone] [facility] [address]

## 2024-03-24 NOTE — Addendum Note (Signed)
 Addended by: DEANNA ROSELLA A on: 03/24/2024 04:29 PM   Modules accepted: Orders

## 2024-03-24 NOTE — Progress Notes (Signed)
" ° °  03/24/2024  Patient ID: Kenneth Mcbride, male   DOB: 01/05/1986, 39 y.o.   MRN: [id number]  Copay to refill 1 month of Januvia  100mg  would be $173 based on remaining deductible amount of >$1000.  This will not be affordable for the patient.  Cheaper alternatives include:  pioglitazone  (around $8 for 3 months) or glipizide xl (around $10 for 3 months).  Pioglitazone  should not cause weight gain that may occur with glipizide use.  This medication may also improve lipids along with A1c and does not increase risk of hypoglycemia.  Would need to monitor for an s/sx of CHF such as edema.  Coordinating with PCP to decide what to change patient to.  Kenneth Mcbride, PharmD, DPLA  "

## 2024-03-25 ENCOUNTER — Telehealth: Payer: Self-pay

## 2024-03-25 NOTE — Progress Notes (Unsigned)
" ° °  03/25/2024  Patient ID: Kenneth Mcbride, male   DOB: 07-12-1985, 39 y.o.   MRN: [id number]  Discussed medication alternatives for Januvia  100mg  daily with PCP since this medication will no longer be affordable based on high deductible plan.  Provider would like to initiate pioglitazone  15mg  daily.  Order pending for 90 day supply with no additional refills.  Sending patient MyChart message to make him aware, provide medication information, and schedule a 4 week telephone visit with me.  He sees PCP again 3/23.  [name], PharmD, DPLA  "

## 2024-03-26 MED ORDER — PIOGLITAZONE HCL 15 MG PO TABS: 15.0000 mg | ORAL_TABLET | Freq: Every day | ORAL | 0 refills | Status: AC

## 2024-04-21 ENCOUNTER — Other Ambulatory Visit

## 2024-05-19 ENCOUNTER — Ambulatory Visit: Admitting: Physician Assistant
